# Patient Record
Sex: Female | Born: 1940
Health system: Southern US, Community
[De-identification: ages and names within clinical notes are randomized; demographics above are authoritative.]

## PROBLEM LIST (undated history)

## (undated) DIAGNOSIS — J449 Chronic obstructive pulmonary disease, unspecified: Secondary | ICD-10-CM

## (undated) DIAGNOSIS — I10 Essential (primary) hypertension: Secondary | ICD-10-CM

## (undated) DIAGNOSIS — I2510 Atherosclerotic heart disease of native coronary artery without angina pectoris: Secondary | ICD-10-CM

## (undated) DIAGNOSIS — K219 Gastro-esophageal reflux disease without esophagitis: Secondary | ICD-10-CM

## (undated) DIAGNOSIS — I4891 Unspecified atrial fibrillation: Secondary | ICD-10-CM

## (undated) DIAGNOSIS — Z9289 Personal history of other medical treatment: Secondary | ICD-10-CM

## (undated) DIAGNOSIS — E785 Hyperlipidemia, unspecified: Secondary | ICD-10-CM

## (undated) DIAGNOSIS — R011 Cardiac murmur, unspecified: Secondary | ICD-10-CM

## (undated) DIAGNOSIS — I5032 Chronic diastolic (congestive) heart failure: Secondary | ICD-10-CM

## (undated) DIAGNOSIS — I701 Atherosclerosis of renal artery: Secondary | ICD-10-CM

## (undated) DIAGNOSIS — M545 Low back pain, unspecified: Secondary | ICD-10-CM

## (undated) DIAGNOSIS — C50919 Malignant neoplasm of unspecified site of unspecified female breast: Secondary | ICD-10-CM

## (undated) DIAGNOSIS — M419 Scoliosis, unspecified: Secondary | ICD-10-CM

## (undated) DIAGNOSIS — M1990 Unspecified osteoarthritis, unspecified site: Secondary | ICD-10-CM

## (undated) DIAGNOSIS — G8929 Other chronic pain: Secondary | ICD-10-CM

## (undated) DIAGNOSIS — I219 Acute myocardial infarction, unspecified: Secondary | ICD-10-CM

## (undated) HISTORY — DX: Atherosclerotic heart disease of native coronary artery without angina pectoris: I25.10

## (undated) HISTORY — DX: Hyperlipidemia, unspecified: E78.5

## (undated) HISTORY — DX: Scoliosis, unspecified: M41.9

## (undated) HISTORY — DX: Unspecified atrial fibrillation: I48.91

## (undated) HISTORY — DX: Unspecified osteoarthritis, unspecified site: M19.90

## (undated) HISTORY — DX: Chronic diastolic (congestive) heart failure: I50.32

## (undated) HISTORY — DX: Essential (primary) hypertension: I10

## (undated) HISTORY — DX: Atherosclerosis of renal artery: I70.1

## (undated) HISTORY — DX: Chronic obstructive pulmonary disease, unspecified: J44.9

## (undated) HISTORY — PX: TUBAL LIGATION: SHX77

---

## 1969-06-27 HISTORY — PX: ABDOMINAL HYSTERECTOMY: SHX81

## 1969-06-27 HISTORY — PX: APPENDECTOMY: SHX54

## 1971-10-28 HISTORY — PX: TONSILLECTOMY: SUR1361

## 1995-10-28 DIAGNOSIS — C50919 Malignant neoplasm of unspecified site of unspecified female breast: Secondary | ICD-10-CM

## 1995-10-28 HISTORY — PX: BREAST LUMPECTOMY: SHX2

## 1995-10-28 HISTORY — PX: BREAST BIOPSY: SHX20

## 1995-10-28 HISTORY — DX: Malignant neoplasm of unspecified site of unspecified female breast: C50.919

## 1999-10-28 HISTORY — PX: LAPAROSCOPIC CHOLECYSTECTOMY: SUR755

## 2000-03-26 ENCOUNTER — Encounter: Admission: RE | Admit: 2000-03-26 | Discharge: 2000-03-26 | Payer: Self-pay | Admitting: Family Medicine

## 2000-03-26 ENCOUNTER — Encounter: Payer: Self-pay | Admitting: Family Medicine

## 2000-07-15 ENCOUNTER — Encounter: Payer: Self-pay | Admitting: Family Medicine

## 2000-07-15 ENCOUNTER — Encounter: Admission: RE | Admit: 2000-07-15 | Discharge: 2000-07-15 | Payer: Self-pay | Admitting: Family Medicine

## 2002-07-07 ENCOUNTER — Encounter: Admission: RE | Admit: 2002-07-07 | Discharge: 2002-07-07 | Payer: Self-pay | Admitting: Family Medicine

## 2002-07-07 ENCOUNTER — Encounter: Payer: Self-pay | Admitting: Family Medicine

## 2003-03-20 ENCOUNTER — Encounter: Payer: Self-pay | Admitting: Emergency Medicine

## 2003-03-20 ENCOUNTER — Emergency Department (HOSPITAL_COMMUNITY): Admission: EM | Admit: 2003-03-20 | Discharge: 2003-03-20 | Payer: Self-pay | Admitting: Emergency Medicine

## 2003-03-24 ENCOUNTER — Ambulatory Visit (HOSPITAL_COMMUNITY): Admission: RE | Admit: 2003-03-24 | Discharge: 2003-03-25 | Payer: Self-pay | Admitting: General Surgery

## 2003-03-24 ENCOUNTER — Encounter: Payer: Self-pay | Admitting: General Surgery

## 2003-03-24 ENCOUNTER — Encounter (INDEPENDENT_AMBULATORY_CARE_PROVIDER_SITE_OTHER): Payer: Self-pay | Admitting: Specialist

## 2006-03-08 ENCOUNTER — Encounter: Admission: RE | Admit: 2006-03-08 | Discharge: 2006-03-08 | Payer: Self-pay | Admitting: Orthopedic Surgery

## 2006-03-27 ENCOUNTER — Encounter: Admission: RE | Admit: 2006-03-27 | Discharge: 2006-03-27 | Payer: Self-pay | Admitting: Orthopedic Surgery

## 2006-03-31 ENCOUNTER — Encounter: Admission: RE | Admit: 2006-03-31 | Discharge: 2006-03-31 | Payer: Self-pay | Admitting: Internal Medicine

## 2006-04-10 ENCOUNTER — Encounter: Admission: RE | Admit: 2006-04-10 | Discharge: 2006-04-10 | Payer: Self-pay | Admitting: Orthopedic Surgery

## 2008-06-20 ENCOUNTER — Encounter: Payer: Self-pay | Admitting: Emergency Medicine

## 2008-08-22 ENCOUNTER — Ambulatory Visit: Payer: Self-pay | Admitting: Emergency Medicine

## 2008-08-22 DIAGNOSIS — J984 Other disorders of lung: Secondary | ICD-10-CM | POA: Insufficient documentation

## 2008-08-22 DIAGNOSIS — I10 Essential (primary) hypertension: Secondary | ICD-10-CM | POA: Insufficient documentation

## 2008-08-22 DIAGNOSIS — J309 Allergic rhinitis, unspecified: Secondary | ICD-10-CM | POA: Insufficient documentation

## 2008-08-22 DIAGNOSIS — F17200 Nicotine dependence, unspecified, uncomplicated: Secondary | ICD-10-CM | POA: Insufficient documentation

## 2008-08-22 DIAGNOSIS — J438 Other emphysema: Secondary | ICD-10-CM | POA: Insufficient documentation

## 2008-08-22 DIAGNOSIS — J449 Chronic obstructive pulmonary disease, unspecified: Secondary | ICD-10-CM | POA: Insufficient documentation

## 2008-08-22 DIAGNOSIS — C50919 Malignant neoplasm of unspecified site of unspecified female breast: Secondary | ICD-10-CM | POA: Insufficient documentation

## 2009-02-28 ENCOUNTER — Ambulatory Visit: Payer: Self-pay | Admitting: Emergency Medicine

## 2009-03-06 ENCOUNTER — Ambulatory Visit: Payer: Self-pay | Admitting: Cardiology

## 2009-03-29 ENCOUNTER — Encounter: Payer: Self-pay | Admitting: Emergency Medicine

## 2009-06-21 ENCOUNTER — Telehealth (INDEPENDENT_AMBULATORY_CARE_PROVIDER_SITE_OTHER): Payer: Self-pay | Admitting: *Deleted

## 2009-06-26 ENCOUNTER — Encounter (INDEPENDENT_AMBULATORY_CARE_PROVIDER_SITE_OTHER): Payer: Self-pay | Admitting: *Deleted

## 2009-08-16 ENCOUNTER — Encounter (INDEPENDENT_AMBULATORY_CARE_PROVIDER_SITE_OTHER): Payer: Self-pay | Admitting: *Deleted

## 2009-10-27 DIAGNOSIS — I219 Acute myocardial infarction, unspecified: Secondary | ICD-10-CM

## 2009-10-27 HISTORY — DX: Acute myocardial infarction, unspecified: I21.9

## 2009-11-27 DIAGNOSIS — Z9289 Personal history of other medical treatment: Secondary | ICD-10-CM

## 2009-11-27 HISTORY — DX: Personal history of other medical treatment: Z92.89

## 2009-11-27 HISTORY — PX: CARDIAC CATHETERIZATION: SHX172

## 2009-12-04 ENCOUNTER — Encounter: Admission: RE | Admit: 2009-12-04 | Discharge: 2009-12-04 | Payer: Self-pay | Admitting: Internal Medicine

## 2009-12-18 ENCOUNTER — Inpatient Hospital Stay (HOSPITAL_BASED_OUTPATIENT_CLINIC_OR_DEPARTMENT_OTHER): Admission: RE | Admit: 2009-12-18 | Discharge: 2009-12-18 | Payer: Self-pay | Admitting: Cardiology

## 2009-12-18 ENCOUNTER — Encounter: Payer: Self-pay | Admitting: Emergency Medicine

## 2009-12-18 ENCOUNTER — Ambulatory Visit: Payer: Self-pay | Admitting: Cardiothoracic Surgery

## 2009-12-18 ENCOUNTER — Ambulatory Visit: Payer: Self-pay | Admitting: Internal Medicine

## 2009-12-18 ENCOUNTER — Inpatient Hospital Stay (HOSPITAL_COMMUNITY): Admission: AD | Admit: 2009-12-18 | Discharge: 2009-12-27 | Payer: Self-pay | Admitting: Cardiology

## 2009-12-19 ENCOUNTER — Encounter: Payer: Self-pay | Admitting: Cardiology

## 2009-12-21 HISTORY — PX: CORONARY ARTERY BYPASS GRAFT: SHX141

## 2010-01-16 ENCOUNTER — Ambulatory Visit: Payer: Self-pay | Admitting: Cardiothoracic Surgery

## 2010-01-16 ENCOUNTER — Encounter: Admission: RE | Admit: 2010-01-16 | Discharge: 2010-01-16 | Payer: Self-pay | Admitting: Cardiothoracic Surgery

## 2010-01-21 ENCOUNTER — Inpatient Hospital Stay (HOSPITAL_COMMUNITY): Admission: EM | Admit: 2010-01-21 | Discharge: 2010-01-24 | Payer: Self-pay | Admitting: Emergency Medicine

## 2010-01-22 ENCOUNTER — Encounter: Payer: Self-pay | Admitting: Cardiology

## 2010-01-24 ENCOUNTER — Telehealth (INDEPENDENT_AMBULATORY_CARE_PROVIDER_SITE_OTHER): Payer: Self-pay | Admitting: *Deleted

## 2010-02-27 ENCOUNTER — Ambulatory Visit: Payer: Self-pay | Admitting: Cardiothoracic Surgery

## 2010-02-27 ENCOUNTER — Encounter: Admission: RE | Admit: 2010-02-27 | Discharge: 2010-02-27 | Payer: Self-pay | Admitting: Cardiothoracic Surgery

## 2010-05-14 IMAGING — CT CT ABD-PELV W/O CM
2 of 4 series · 17 of 46 positions shown, 19 images · non-contrast
Comparison: Chest CT 12/19/2009.  Lumbar MRI 03/08/2006.

CLINICAL DATA: 68-year-old female with nausea, vomiting and fever.
History of left breast cancer.  Recent CABG.  History of lung
nodules.

CT ABDOMEN AND PELVIS WITHOUT CONTRAST
TECHNIQUE: Multidetector CT imaging of the abdomen and pelvis was
performed following the standard protocol without intravenous
contrast.

[Series 2: abd/pelv w/o 5.0 b31f st · axial · non-contrast · 0.64mm/px · z∈[-436,-31]mm · 14 of 89 slices shown, 16 images]
[im 4/89  soft-tissue]
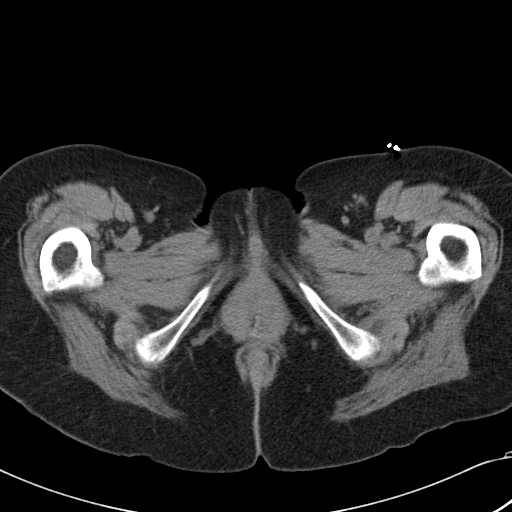
[im 4/89  bone]
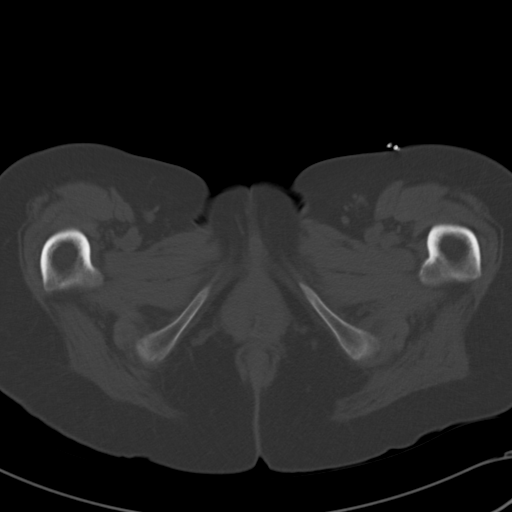
[im 12/89  soft-tissue]
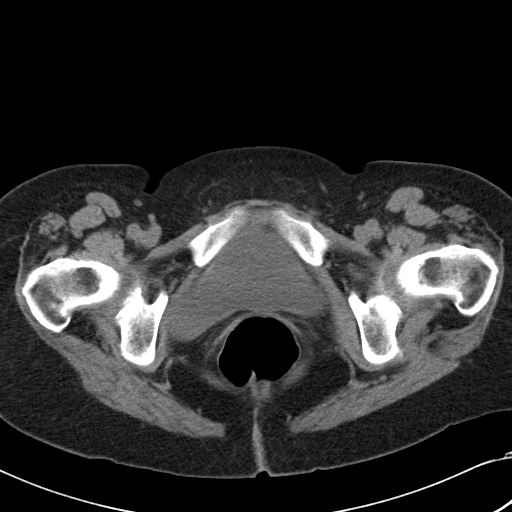
[im 19/89  soft-tissue]
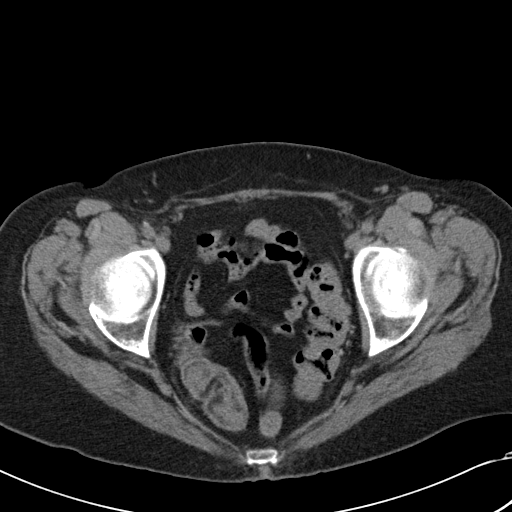
[im 23/89  soft-tissue]
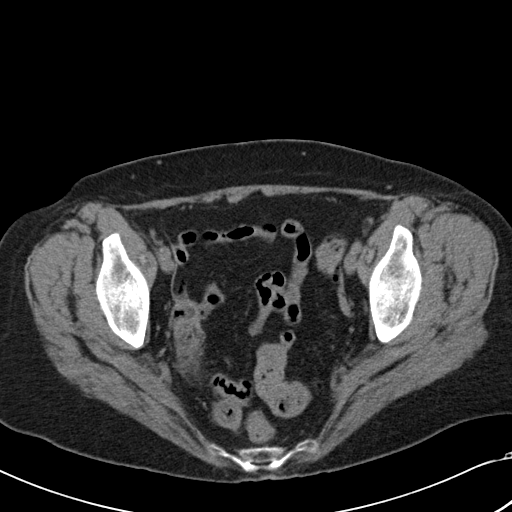
[im 30/89  soft-tissue]
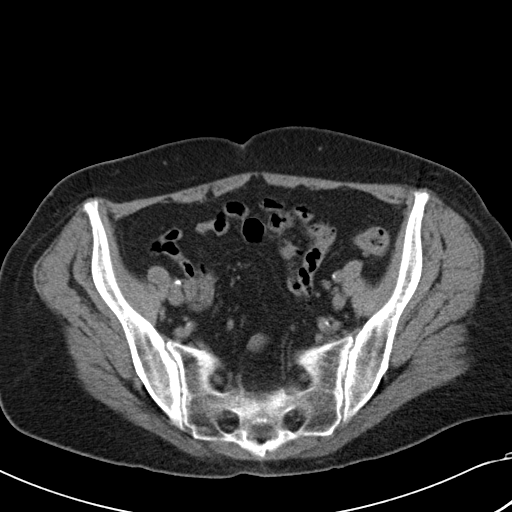
[im 37/89  soft-tissue]
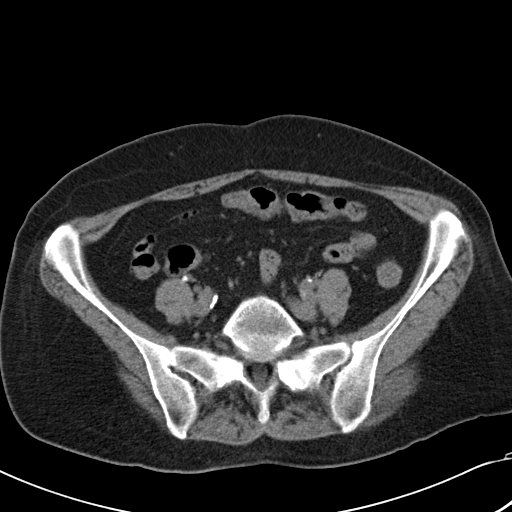
[im 41/89  soft-tissue]
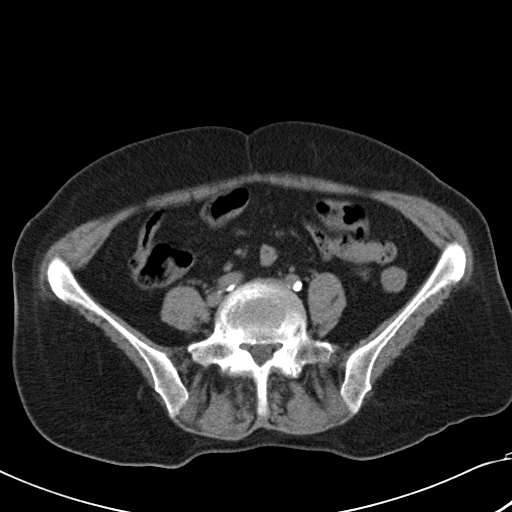
[im 48/89  soft-tissue]
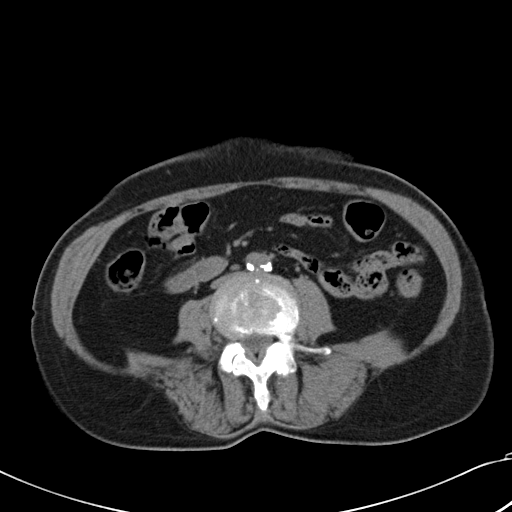
[im 52/89  soft-tissue]
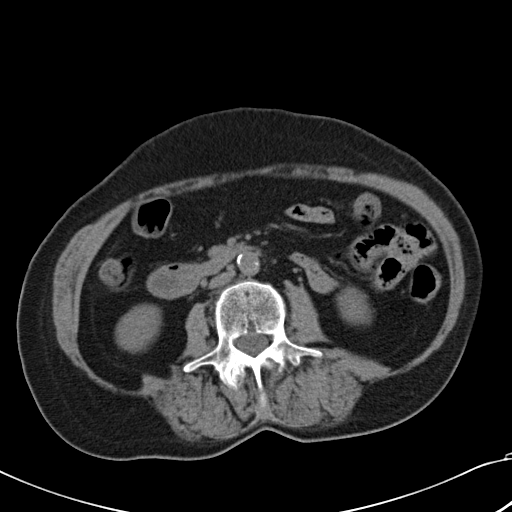
[im 52/89  bone]
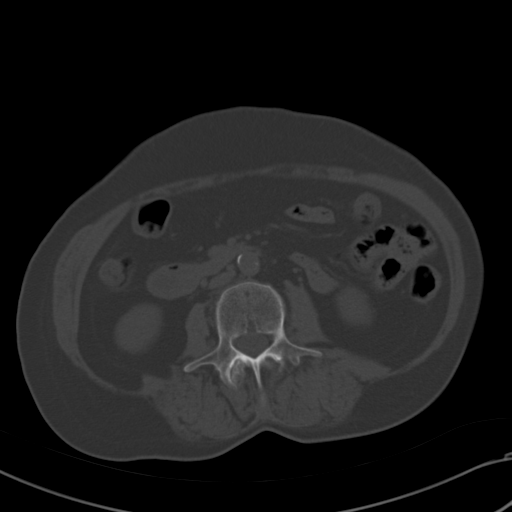
[im 59/89  soft-tissue]
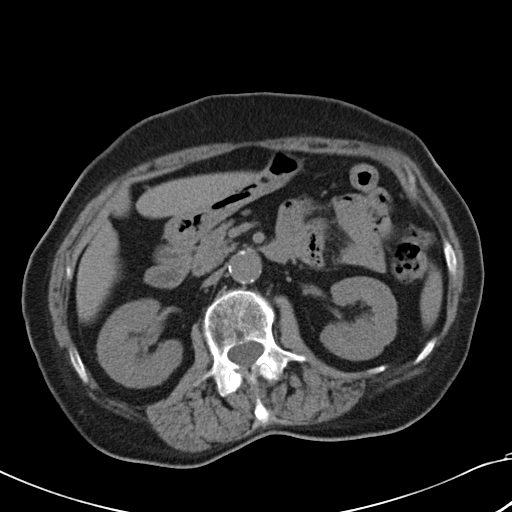
[im 67/89  soft-tissue]
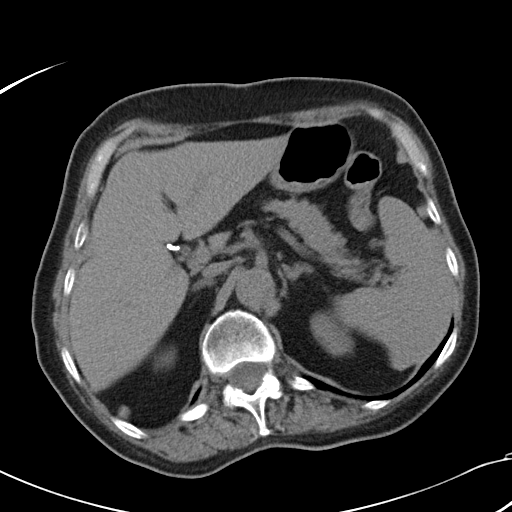
[im 70/89  soft-tissue]
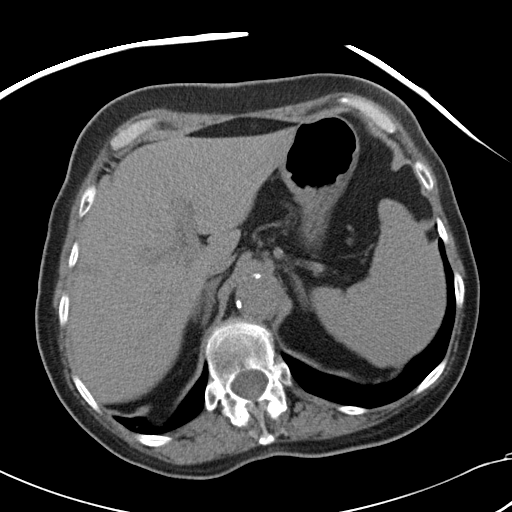
[im 78/89  soft-tissue]
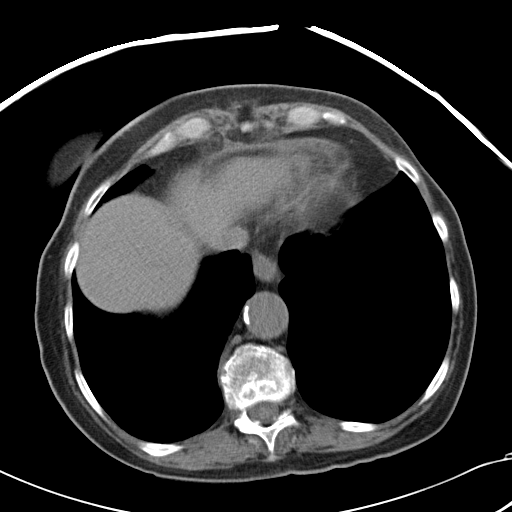
[im 85/89  soft-tissue]
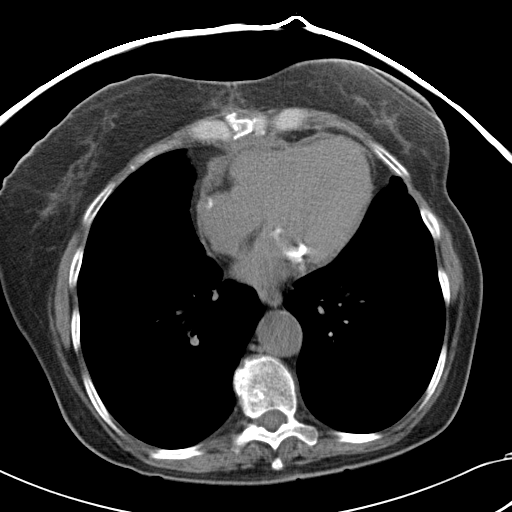

[Series 602: cor a/p · coronal · 0.87mm/px · 3 of 110 slices shown]
[im 37/110  soft-tissue]
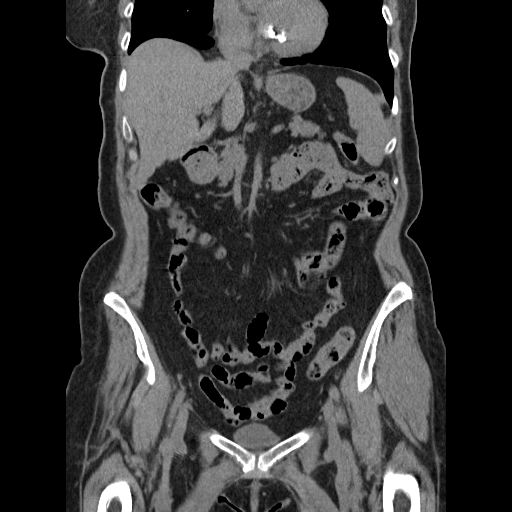
[im 49/110  soft-tissue]
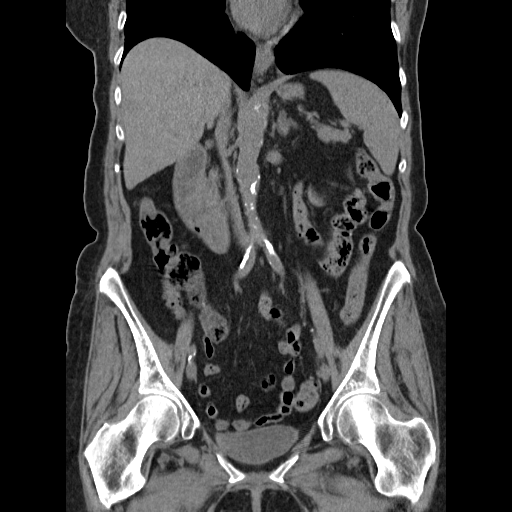
[im 61/110  soft-tissue]
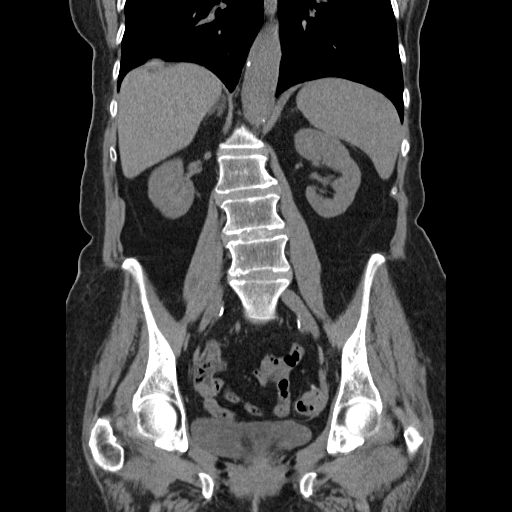

[17 of 46 positions shown; findings below may reference images not displayed]

FINDINGS: Stable 3 mm nodule in the lateral segment of the right
middle lobe.  A degree of diffuse centrilobular emphysema is re-
identified.  No pleural or pericardial effusion. No acute osseous
abnormality identified.  Diverticulosis of the sigmoid colon
without definite acute inflammatory change.  No dilated bowel.  The
cecum is located in the deep right hemi pelvis.  Bladder is
unremarkable.  Uterus and adnexa appear surgically absent.  Diffuse
calcified atherosclerosis.  Gallbladder surgically absent.
Noncontrast appearance of the liver, spleen, pancreas, adrenal
glands, and kidneys is within normal limits.  No free fluid.  The
stomach is decompressed.  Mild ectasia of the infrarenal abdominal
aorta, 223 mm in diameter.
IMPRESSION: 1.  No inflammatory process identified.
2.  Sigmoid diverticulosis.
3.  Ectasia of the infrarenal abdominal aorta.
4.  Stable small right middle lobe lung nodule.

## 2010-05-29 ENCOUNTER — Ambulatory Visit: Payer: Self-pay | Admitting: Cardiology

## 2010-06-04 ENCOUNTER — Ambulatory Visit: Payer: Self-pay | Admitting: Cardiology

## 2010-06-21 ENCOUNTER — Encounter: Payer: Self-pay | Admitting: Emergency Medicine

## 2010-06-21 ENCOUNTER — Ambulatory Visit: Payer: Self-pay | Admitting: Cardiology

## 2010-06-26 ENCOUNTER — Encounter: Admission: RE | Admit: 2010-06-26 | Discharge: 2010-06-26 | Payer: Self-pay | Admitting: Cardiology

## 2010-06-27 ENCOUNTER — Encounter: Admission: RE | Admit: 2010-06-27 | Discharge: 2010-06-27 | Payer: Self-pay | Admitting: Cardiology

## 2010-09-17 ENCOUNTER — Ambulatory Visit: Payer: Self-pay | Admitting: Cardiology

## 2010-09-17 ENCOUNTER — Ambulatory Visit: Payer: Self-pay | Admitting: Adult Health

## 2010-09-17 ENCOUNTER — Encounter: Payer: Self-pay | Admitting: Emergency Medicine

## 2010-09-18 ENCOUNTER — Ambulatory Visit (HOSPITAL_COMMUNITY)
Admission: RE | Admit: 2010-09-18 | Discharge: 2010-09-18 | Payer: Self-pay | Source: Home / Self Care | Admitting: Emergency Medicine

## 2010-09-18 ENCOUNTER — Ambulatory Visit: Payer: Self-pay

## 2010-09-18 ENCOUNTER — Encounter: Payer: Self-pay | Admitting: Cardiology

## 2010-09-18 ENCOUNTER — Encounter: Payer: Self-pay | Admitting: Emergency Medicine

## 2010-09-18 ENCOUNTER — Ambulatory Visit: Payer: Self-pay | Admitting: Cardiovascular Disease

## 2010-09-20 ENCOUNTER — Ambulatory Visit: Payer: Self-pay | Admitting: Cardiovascular Disease

## 2010-09-20 ENCOUNTER — Encounter: Payer: Self-pay | Admitting: Emergency Medicine

## 2010-10-01 ENCOUNTER — Encounter: Payer: Self-pay | Admitting: Emergency Medicine

## 2010-10-01 ENCOUNTER — Ambulatory Visit: Payer: Self-pay | Admitting: Vascular Surgery

## 2010-10-09 ENCOUNTER — Ambulatory Visit: Payer: Self-pay | Admitting: Emergency Medicine

## 2010-10-09 DIAGNOSIS — I509 Heart failure, unspecified: Secondary | ICD-10-CM

## 2010-10-09 DIAGNOSIS — D649 Anemia, unspecified: Secondary | ICD-10-CM | POA: Insufficient documentation

## 2010-10-09 DIAGNOSIS — I2510 Atherosclerotic heart disease of native coronary artery without angina pectoris: Secondary | ICD-10-CM | POA: Insufficient documentation

## 2010-10-09 DIAGNOSIS — I110 Hypertensive heart disease with heart failure: Secondary | ICD-10-CM | POA: Insufficient documentation

## 2010-10-09 LAB — CONVERTED CEMR LAB: Cholesterol, target level: 200 mg/dL

## 2010-11-17 ENCOUNTER — Encounter: Payer: Self-pay | Admitting: Cardiothoracic Surgery

## 2010-11-17 ENCOUNTER — Encounter: Payer: Self-pay | Admitting: Orthopedic Surgery

## 2010-11-28 NOTE — Cardiovascular Report (Signed)
Summary: Roger Shelter MD  Roger Shelter MD   Imported By: Lester [place] 10/11/2010 12:27:55  _____________________________________________________________________  External Attachment:    Type:   Image     Comment:   External Document

## 2010-11-28 NOTE — Letter (Signed)
Summary: [facility]   Imported By: Lester [place] 10/11/2010 12:31:07  _____________________________________________________________________  External Attachment:    Type:   Image     Comment:   External Document

## 2010-11-28 NOTE — Letter (Signed)
Summary: [facility]  [facility]   Imported By: Sherian Rein 10/24/2010 10:11:43  _____________________________________________________________________  External Attachment:    Type:   Image     Comment:   External Document

## 2010-11-28 NOTE — Letter (Signed)
Summary: [facility]  [facility]   Imported By: Sherian Rein 10/11/2010 10:04:43  _____________________________________________________________________  External Attachment:    Type:   Image     Comment:   External Document

## 2010-11-28 NOTE — Progress Notes (Signed)
Summary: Proventil changed to Proair due to insurance coverage  Phone Note Outgoing Call   Call placed by: Michel Bickers CMA,  January 24, 2010 4:03 PM Call placed to: Patient Summary of Call: Patients insurance will no longer cover Proventil.  Proair is covered  and Dr. Delton Coombes is okay with the change. I have sent a new RX to the pt's pharmacy and the pt has been notified. Initial call taken by: Michel Bickers CMA,  January 24, 2010 4:07 PM    New/Updated Medications: PROAIR HFA 108 (90 BASE) MCG/ACT AERS (ALBUTEROL SULFATE) 2 puffs every four hours as needed for shortness of breath Prescriptions: PROAIR HFA 108 (90 BASE) MCG/ACT AERS (ALBUTEROL SULFATE) 2 puffs every four hours as needed for shortness of breath  #1 x 2   Entered by:   Michel Bickers CMA   Authorized by:   Leslye Peer MD   Signed by:   Michel Bickers CMA on 01/24/2010   Method used:   Electronically to        [facility]  [phone]* (retail)       [address]       [place], [place]  [address]       Ph: [phone]       Fax: [phone]   RxID:   [id number]

## 2010-11-28 NOTE — Letter (Signed)
Summary: [facility]  [facility]   Imported By: Lester [place] 10/11/2010 12:25:33  _____________________________________________________________________  External Attachment:    Type:   Image     Comment:   External Document

## 2010-11-28 NOTE — Assessment & Plan Note (Signed)
Summary: COPD, CAD, anemia   Visit Type:  Follow-up Copy to:  Dr. Eloise Harman Primary Provider/Referring Provider:  Dr Jarome Matin  CC:  Pt here for follow-up on Echo results...she c/o increased SOB with exertion and wheezing at night and Lipid Management.  History of Present Illness: 70 yo woman with heavy tobacco use, allergies, HTN, divertulitis, breast CA s/p L lumpectomy in 1998.   September 17, 2010 -Presents for an acute office visit. Complains of over last 2 weeks her breathing has  gotten worse. She gets dyspneic with walking incline, wears out easily. Says she has been very swollen, esp in legs. "feel puffed up everywhere" . She was seen by PCP 1 week ago tx w/ zpack and depo medrol shot. with no improvement. Says she had a dry cough but this has resolved. Does have orthopnea. [facility] this [facility], lasix was increased 40mg  two times a day, labs w/ bnp is pending. She has been set up for PFT and echo later this week. She is been referred for possible renal artery stenting. She was started on Lisinopril this am for elevated b/p. She quit smoking in 11/2009 after her CABG. We have not seen her in >1 year. Says she takes her symbicort and spiriva everyday. Computer review shows CT scan done in 11/2009 showed stable lung nodules over  2 yr span, so considered benign in nature. Says she had cxr done at PCP last week w/ no PNA on it. Denies chest pain,  hemoptysis, fever, n/v/d, edema, headache, discolored mucus.   ROV 10/09/10 -- 70 yo former smoker, hx CAD, underwent CABG since I last saw her. Seen by TP as above, has had echocardiogram performed that showed LVH and grade 1 diastolic dysfxn, estimated PASP . Lasix increased in November in response to increased dyspnea. Still "retaining some fluid". Her breathing has been slightly better today, but overall her breathing, fatigue, edema, have been worse since her CABG. No cough, she does have some wheeze at night and some orthopnea.  Also some wheeze with exertion. On Symbicort + Spiriva, ProAir about 2x a day. Pulm nodule was stable > 2 yrs by last CT scan. She reports that she has been noted to be anemic, has B12 deficiency, is under eval by Dr Dulce Sellar for possible CSY. They want risk eval before proceeding.   Lipid Management History:      Positive NCEP/ATP III risk factors include female age 27 years old or older, current tobacco user, hypertension, and ASHD (either angina/prior MI/prior CABG).    Current Medications (verified): 1)  Spiriva Handihaler 18 Mcg  Caps (Tiotropium Bromide Monohydrate) .... Two Puffs in Handihaler Daily 2)  Hydrocodone-Acetaminophen 5-500 Mg Tabs (Hydrocodone-Acetaminophen) .... Take 1 Tablet By Mouth Two Times A Day 3)  Verapamil Hcl 40 Mg Tabs (Verapamil Hcl) .Marland Kitchen.. 1 By Mouth Daily 4)  Symbicort 160-4.5 Mcg/act  Aero (Budesonide-Formoterol Fumarate) .... Two Puffs Twice Daily 5)  Proair Hfa 108 (90 Base) Mcg/act Aers (Albuterol Sulfate) .... 2 Puffs Every Four Hours As Needed For Shortness of Breath 6)  Fluticasone Propionate 50 Mcg/act Susp (Fluticasone Propionate) .... 2 Sprays in Each Nostril Daily 7)  Loratadine 10 Mg Tabs (Loratadine) .... Take 1 Tablet By Mouth Once A Day 8)  Lisinopril 10 Mg Tabs (Lisinopril) .Marland Kitchen.. 1 By Mouth Once Daily 9)  Metoprolol Succinate 50 Mg Xr24h-Tab (Metoprolol Succinate) .... 1/2 By Mouth Daily  Allergies (verified): No Known Drug Allergies  Vital Signs:  Patient profile:   70  year old female Height:      62 inches (157.48 cm) Weight:      175.50 pounds (79.77 kg) BMI:     32.22 O2 Sat:      97 % on Room air Temp:     97.9 degrees F (36.61 degrees C) oral Pulse rate:   93 / minute BP sitting:   124 / 72  (right arm) Cuff size:   regular  Vitals Entered By: Michel Bickers CMA (October 09, 2010 3:58 PM)  O2 Sat at Rest %:  97 O2 Flow:  Room air  Serial Vital Signs/Assessments:  Comments: 5:04 PM Ambulatory Pulse Oximetry  Resting; HR__73___     02 Sat_97% on room air____  Lap1 (185 feet)   HR_92____   02 Sat__98% on room air___ Lap2 (185 feet)   HR_101____   02 Sat__98% on room air___    Lap3 (185 feet)   HR_101____   02 Sat__96% on room air___  x___Test Completed without Difficulty ___Test Stopped due to:  By: Michel Bickers CMA   CC: Pt here for follow-up on Echo results...she c/o increased SOB with exertion and wheezing at night, Lipid Management Comments Medications reviewed with patient Michel Bickers [facility]  October 09, 2010 3:58 PM   Physical Exam  General:  normal appearance.   Head:  normocephalic and atraumatic Nose:  no deformity, discharge, inflammation, or lesions Mouth:  no deformity or lesions Neck:  no masses, thyromegaly, or abnormal cervical nodes Chest Wall:  no deformities noted Lungs:  distant, no wheeze during normal cycle or on forced expiration Heart:  regular rate and rhythm, S1, S2 without murmurs, rubs, gallops, or clicks Abdomen:  not examined Msk:  no deformity or scoliosis noted with normal posture Extremities:  no clubbing, cyanosis, edema, or deformity noted Neurologic:  non-focal Skin:  intact without lesions or rashes Psych:  alert and cooperative; normal mood and affect; normal attention span and concentration   Impression & Recommendations:  Problem # 1:  COPD (ICD-496) - walking oximetry today - Spiriva + Symbicort - review PFT  Problem # 2:  PULMONARY NODULE (ICD-518.89) Stable > 2 yrs by CT scan  Problem # 3:  BEN HYPERTENSIVE HEART DISEASE W/HEART FAILURE (ICD-402.11)  Suspect that her dyspnea is due to her HTN and diastolic dysfxn. She has stopped smoking, is on stable BD's, has had her CAD addressed w CABG. We discussed possibility that she could have OSA.  - agree w agressive BP control, diuretics  Orders: Est. Patient Level IV (16109)  Problem # 4:  ANEMIA (ICD-285.9)  Could explain her dyspnea as well. Eval has revealed B12 def but I agree that we need r/o GI  malignancy. She is following w Dr Dulce Sellar. I explained to her that she is at higher risk for any procedure, even those under conscious sedation, due to her lung and heart disease, but that she could and should undergo the CSY if the benefits outweigh these risks.   Orders: Est. Patient Level IV (60454)  Medications Added to Medication List This Visit: 1)  Verapamil Hcl 40 Mg Tabs (Verapamil hcl) .Marland Kitchen.. 1 by mouth daily 2)  Fluticasone Propionate 50 Mcg/act Susp (Fluticasone propionate) .... 2 sprays in each nostril daily 3)  Metoprolol Succinate 50 Mg Xr24h-tab (Metoprolol succinate) .... 1/2 by mouth daily  Lipid Assessment/Plan:      Based on NCEP/ATP III, the patient's risk factor category is "history of coronary disease, peripheral vascular disease, cerebrovascular disease, or aortic aneurysm along  with either diabetes, current smoker, or LDL > 130 plus HDL < 40 plus triglycerides > 200".  The patient's lipid goals are as follows: Total cholesterol goal is 200; LDL cholesterol goal is 70; HDL cholesterol goal is 40; Triglyceride goal is 150.     Patient Instructions: 1)  Continue your Spiriva and Symbicort 2)  Use ProAir as needed  3)  Walking oximetry today shows that you do not need oxygen when you exert yourself.  4)  Continue lisinopril and lasix as ordered by Dr Ronnald Nian office 5)  You are at increased risk for any procedure due to your lung and heart disease, but this does not exclude you from having colonoscopy if needed.  6)  Follow up with Dr Delton Coombes in 3 months or as needed

## 2010-11-28 NOTE — Assessment & Plan Note (Signed)
Summary: sob/jd   Copy to:  Dr. Eloise Harman Primary Gaila Engebretsen/Referring [place]:  Dr Jarome Matin  CC:  short of breath with walking .  History of Present Illness: 70 yo woman with heavy tobacco use, allergies, HTN, divertulitis, breast CA s/p L lumpectomy in 1998.   September 17, 2010 -Presents for an acute office visit. Complains of over last 2 weeks her breathing has  gotten worse. She gets dyspneic with walking incline, wears out easily. Says she has been very swollen, esp in legs. "feel puffed up everywhere" . She was seen by PCP 1 week ago tx w/ zpack and depo medrol shot. with no improvement. Says she had a dry cough but this has resolved. Does have orthopnea. [facility] was [facility], lasix was increased 40mg  two times a day, labs w/ bnp is pending. She has been set up for PFT and echo later this week. She is been referred for possible renal artery stenting. She was started on Lisinopril this am for elevated b/p. She quit smoking in 11/2009 after her CABG. We have not seen her in >1 year. Says she takes her symbicort and spiriva everyday. Computer review shows CT scan done in 11/2009 showed stable lung nodules over  2 yr span, so considered benign in nature. Says she had cxr done at PCP last week w/ no PNA on it. Denies chest pain,  hemoptysis, fever, n/v/d, edema, headache, discolored mucus.   Allergies: No Known Drug Allergies  Past History:  Past Medical History: Last updated: 08/22/2008 Allergic Rhinitis Emphysema Hypertension  Past Surgical History: Last updated: 02/28/2009 Breast Surgery:....cancer   Social History: Last updated: 09/17/2010 Patient quit 11/2009, Smoked since age 4  1ppd.  Pt is married with children. Pt is retired.  Previously worked  in [employer], dispensing Works part time for [employer].  Social History: Patient quit 11/2009, Smoked since age 89  1ppd.  Pt is married with children. Pt is retired.  Previously worked  in  Optician, dispensing Works part time for [employer].  Review of Systems      See HPI  Vital Signs:  Patient profile:   70 year old female Height:      62 inches Weight:      178 pounds O2 Sat:      99 % on Room air Temp:     97 degrees F oral Pulse rate:   84 / minute Resp:     14 per minute BP sitting:   130 / 70  O2 Flow:  Room air  Physical Exam  Additional Exam:  GEN: A/Ox3; pleasant , NAD HEENT:  [place]/AT, , EACs-clear, TMs-wnl, NOSE-clear, THROAT-clear NECK:  Supple w/ fair ROM; no JVD; normal carotid impulses w/o bruits; no thyromegaly or nodules palpated; no lymphadenopathy. RESP  Clear to P & A; w/o, wheezes/ rales/ or rhonchi. CARD:  RRR, no m/r/g , 2+ edema bilaterally  GI:   Soft & nt; nml bowel sounds; no organomegaly or masses detected. [place]: Warm bil,  no calf tenderness, clubbing, pulses intact Neuro: intact w/ no focal deficits noted.    Impression & Recommendations:  Problem # 1:  COPD (ICD-496) Hx of smoking. She is going for PFTs in am , we have no previous FEV1 on file.  CT scan w/ stable nodules x 1 year, considered benign.  recent xray per pt w/ no acute process.  suspect her dsypnea is multifactoral w/ COPD ? severity along w/ fluid overload ? cor pulmonale  she is going for 2 d echo this week may have component of diastolic dsfxn as well  would tx w/ diuresis. hold steroid at this time- she is not actively wheezing and steroids will increase  fluid retention.  no abx -she has no fever or discolored mucus and recent xray neg.  follow up Dr. Delton Coombes in 2 weeks to review PFTs results.  follow up Cards on 11/25 as planned  Plan  Stop Advil, avoid NSAIDS( advil, ibuprofen, aleve, etc) Follow up Dr. Jiles Harold, Dec. 14th at 3:50pm  Continue on Symbicort and Spiriva .  Low salt diet, keep legs elevated.  follow up for breathing test in am.  follow up for echo as scheduled this week.  Please contact office for sooner follow up if symptoms do  not improve or worsen   Medications Added to Medication List This Visit: 1)  Lisinopril 10 Mg Tabs (Lisinopril) .Marland Kitchen.. 1 by mouth once daily  Complete Medication List: 1)  Spiriva Handihaler 18 Mcg Caps (Tiotropium bromide monohydrate) .... Two puffs in handihaler daily 2)  Hydrocodone-acetaminophen 5-500 Mg Tabs (Hydrocodone-acetaminophen) .... Take 1 tablet by mouth two times a day 3)  Benicar 20 Mg Tabs (Olmesartan medoxomil) .... Take 1 tablet by mouth once a day 4)  Symbicort 160-4.5 Mcg/act Aero (Budesonide-formoterol fumarate) .... Two puffs twice daily 5)  Proair Hfa 108 (90 Base) Mcg/act Aers (Albuterol sulfate) .... 2 puffs every four hours as needed for shortness of breath 6)  Nasacort Aq 55 Mcg/act Aers (Triamcinolone acetonide(nasal)) .... Take 2 sprays in each nostril once a day 7)  Loratadine 10 Mg Tabs (Loratadine) .... Take 1 tablet by mouth once a day 8)  Lisinopril 10 Mg Tabs (Lisinopril) .Marland Kitchen.. 1 by mouth once daily  Other Orders: Est. Patient Level III (78295)  Patient Instructions: 1)  Stop Advil, avoid NSAIDS( advil, ibuprofen, aleve, etc) 2)  Follow up Dr. Jiles Harold, Dec. 14th at 3:50pm  3)  Continue on Symbicort and Spiriva .  4)  Low salt diet, keep legs elevated.  5)  follow up for breathing test in am.  6)  follow up for echo as scheduled this week.  7)  Please contact office for sooner follow up if symptoms do not improve or worsen   Appended Document: sob/jd faxed via biscom to Dr. Deborah Chalk per TP's request.  copy of ov note printed and faxed to Norma Fredrickson ANP with [facility] @ [phone].

## 2010-11-28 NOTE — Letter (Signed)
Summary: [facility]   Imported By: Lester [place] 07/19/2010 07:57:52  _____________________________________________________________________  External Attachment:    Type:   Image     Comment:   External Document

## 2011-01-15 LAB — CROSSMATCH
ABO/RH(D): A POS
Antibody Screen: NEGATIVE

## 2011-01-15 LAB — HEPATIC FUNCTION PANEL
ALT: 15 U/L (ref 0–35)
AST: 18 U/L (ref 0–37)
Alkaline Phosphatase: 103 U/L (ref 39–117)
Bilirubin, Direct: <0.1 mg/dL (ref 0.0–0.3)
Total Bilirubin: 0.6 mg/dL (ref 0.3–1.2)

## 2011-01-15 LAB — CBC
HCT: 28.2 % — ABNORMAL LOW (ref 36.0–46.0)
HCT: 29.8 % — ABNORMAL LOW (ref 36.0–46.0)
HCT: 31.1 % — ABNORMAL LOW (ref 36.0–46.0)
HCT: 31.8 % — ABNORMAL LOW (ref 36.0–46.0)
HCT: 32.9 % — ABNORMAL LOW (ref 36.0–46.0)
HCT: 34.5 % — ABNORMAL LOW (ref 36.0–46.0)
HCT: 35.7 % — ABNORMAL LOW (ref 36.0–46.0)
Hemoglobin: 10 g/dL — ABNORMAL LOW (ref 12.0–15.0)
Hemoglobin: 10.4 g/dL — ABNORMAL LOW (ref 12.0–15.0)
Hemoglobin: 10.6 g/dL — ABNORMAL LOW (ref 12.0–15.0)
Hemoglobin: 10.7 g/dL — ABNORMAL LOW (ref 12.0–15.0)
Hemoglobin: 11.1 g/dL — ABNORMAL LOW (ref 12.0–15.0)
MCHC: 33.1 g/dL (ref 30.0–36.0)
MCHC: 33.3 g/dL (ref 30.0–36.0)
MCHC: 33.5 g/dL (ref 30.0–36.0)
MCHC: 33.7 g/dL (ref 30.0–36.0)
MCHC: 33.7 g/dL (ref 30.0–36.0)
MCV: 76.9 fL — ABNORMAL LOW (ref 78.0–100.0)
MCV: 78.5 fL (ref 78.0–100.0)
MCV: 79.2 fL (ref 78.0–100.0)
MCV: 79.6 fL (ref 78.0–100.0)
MCV: 79.8 fL (ref 78.0–100.0)
MCV: 79.9 fL (ref 78.0–100.0)
Platelets: 139 10*3/uL — ABNORMAL LOW (ref 150–400)
Platelets: 139 10*3/uL — ABNORMAL LOW (ref 150–400)
Platelets: 148 10*3/uL — ABNORMAL LOW (ref 150–400)
Platelets: 154 10*3/uL (ref 150–400)
Platelets: 196 10*3/uL (ref 150–400)
RBC: 3.53 MIL/uL — ABNORMAL LOW (ref 3.87–5.11)
RBC: 3.73 MIL/uL — ABNORMAL LOW (ref 3.87–5.11)
RBC: 3.93 MIL/uL (ref 3.87–5.11)
RBC: 3.99 MIL/uL (ref 3.87–5.11)
RBC: 4.19 MIL/uL (ref 3.87–5.11)
RBC: 4.65 MIL/uL (ref 3.87–5.11)
RDW: 17 % — ABNORMAL HIGH (ref 11.5–15.5)
RDW: 17.4 % — ABNORMAL HIGH (ref 11.5–15.5)
RDW: 17.5 % — ABNORMAL HIGH (ref 11.5–15.5)
RDW: 17.6 % — ABNORMAL HIGH (ref 11.5–15.5)
RDW: 17.9 % — ABNORMAL HIGH (ref 11.5–15.5)
RDW: 18.4 % — ABNORMAL HIGH (ref 11.5–15.5)
WBC: 10.6 10*3/uL — ABNORMAL HIGH (ref 4.0–10.5)
WBC: 12.1 10*3/uL — ABNORMAL HIGH (ref 4.0–10.5)
WBC: 12.9 10*3/uL — ABNORMAL HIGH (ref 4.0–10.5)
WBC: 18.2 10*3/uL — ABNORMAL HIGH (ref 4.0–10.5)
WBC: 4.7 10*3/uL (ref 4.0–10.5)
WBC: 5.4 10*3/uL (ref 4.0–10.5)
WBC: 9.3 10*3/uL (ref 4.0–10.5)

## 2011-01-15 LAB — BASIC METABOLIC PANEL
BUN: 13 mg/dL (ref 6–23)
BUN: 14 mg/dL (ref 6–23)
BUN: 19 mg/dL (ref 6–23)
BUN: 24 mg/dL — ABNORMAL HIGH (ref 6–23)
CO2: 24 mEq/L (ref 19–32)
CO2: 24 mEq/L (ref 19–32)
Calcium: 8.1 mg/dL — ABNORMAL LOW (ref 8.4–10.5)
Calcium: 8.3 mg/dL — ABNORMAL LOW (ref 8.4–10.5)
Calcium: 8.4 mg/dL (ref 8.4–10.5)
Calcium: 9.1 mg/dL (ref 8.4–10.5)
Chloride: 101 mEq/L (ref 96–112)
Chloride: 103 mEq/L (ref 96–112)
Chloride: 104 mEq/L (ref 96–112)
Chloride: 108 mEq/L (ref 96–112)
Creatinine, Ser: 0.79 mg/dL (ref 0.4–1.2)
Creatinine, Ser: 0.88 mg/dL (ref 0.4–1.2)
Creatinine, Ser: 1.3 mg/dL — ABNORMAL HIGH (ref 0.4–1.2)
GFR calc Af Amer: 49 mL/min — ABNORMAL LOW (ref >60)
GFR calc Af Amer: 55 mL/min — ABNORMAL LOW (ref >60)
GFR calc Af Amer: >60 mL/min (ref >60)
GFR calc Af Amer: >60 mL/min (ref >60)
GFR calc Af Amer: >60 mL/min (ref >60)
GFR calc non Af Amer: 41 mL/min — ABNORMAL LOW (ref >60)
GFR calc non Af Amer: >60 mL/min (ref >60)
GFR calc non Af Amer: >60 mL/min (ref >60)
GFR calc non Af Amer: >60 mL/min (ref >60)
Glucose, Bld: 100 mg/dL — ABNORMAL HIGH (ref 70–99)
Glucose, Bld: 120 mg/dL — ABNORMAL HIGH (ref 70–99)
Glucose, Bld: 137 mg/dL — ABNORMAL HIGH (ref 70–99)
Glucose, Bld: 149 mg/dL — ABNORMAL HIGH (ref 70–99)
Potassium: 3.9 mEq/L (ref 3.5–5.1)
Potassium: 4 mEq/L (ref 3.5–5.1)
Potassium: 4.4 mEq/L (ref 3.5–5.1)
Potassium: 4.7 mEq/L (ref 3.5–5.1)
Sodium: 133 mEq/L — ABNORMAL LOW (ref 135–145)
Sodium: 135 mEq/L (ref 135–145)
Sodium: 137 mEq/L (ref 135–145)
Sodium: 137 mEq/L (ref 135–145)

## 2011-01-15 LAB — POCT I-STAT 3, ART BLOOD GAS (G3+)
Acid-base deficit: 1 mmol/L (ref 0.0–2.0)
Acid-base deficit: 3 mmol/L — ABNORMAL HIGH (ref 0.0–2.0)
Bicarbonate: 22.2 mEq/L (ref 20.0–24.0)
Bicarbonate: 22.9 mEq/L (ref 20.0–24.0)
Bicarbonate: 23.7 mEq/L (ref 20.0–24.0)
O2 Saturation: 100 %
O2 Saturation: 100 %
O2 Saturation: 96 %
Patient temperature: 36.1
Patient temperature: 37.1
TCO2: 23 mmol/L (ref 0–100)
TCO2: 26 mmol/L (ref 0–100)
pCO2 arterial: 38.4 mmHg (ref 35.0–45.0)
pCO2 arterial: 49.2 mmHg — ABNORMAL HIGH (ref 35.0–45.0)
pH, Arterial: 7.38 (ref 7.350–7.400)
pH, Arterial: 7.4 (ref 7.350–7.400)
pO2, Arterial: 318 mmHg — ABNORMAL HIGH (ref 80.0–100.0)
pO2, Arterial: 72 mmHg — ABNORMAL LOW (ref 80.0–100.0)
pO2, Arterial: 90 mmHg (ref 80.0–100.0)

## 2011-01-15 LAB — COMPREHENSIVE METABOLIC PANEL
ALT: 16 U/L (ref 0–35)
AST: 17 U/L (ref 0–37)
CO2: 26 mEq/L (ref 19–32)
Calcium: 9.3 mg/dL (ref 8.4–10.5)
Chloride: 102 mEq/L (ref 96–112)
Creatinine, Ser: 1.08 mg/dL (ref 0.4–1.2)
GFR calc Af Amer: >60 mL/min (ref >60)
GFR calc non Af Amer: 51 mL/min — ABNORMAL LOW (ref >60)
Glucose, Bld: 106 mg/dL — ABNORMAL HIGH (ref 70–99)
Sodium: 137 mEq/L (ref 135–145)
Total Bilirubin: 0.6 mg/dL (ref 0.3–1.2)

## 2011-01-15 LAB — GLUCOSE, CAPILLARY
Glucose-Capillary: 101 mg/dL — ABNORMAL HIGH (ref 70–99)
Glucose-Capillary: 112 mg/dL — ABNORMAL HIGH (ref 70–99)
Glucose-Capillary: 130 mg/dL — ABNORMAL HIGH (ref 70–99)
Glucose-Capillary: 143 mg/dL — ABNORMAL HIGH (ref 70–99)
Glucose-Capillary: 145 mg/dL — ABNORMAL HIGH (ref 70–99)
Glucose-Capillary: 146 mg/dL — ABNORMAL HIGH (ref 70–99)
Glucose-Capillary: 158 mg/dL — ABNORMAL HIGH (ref 70–99)
Glucose-Capillary: 168 mg/dL — ABNORMAL HIGH (ref 70–99)
Glucose-Capillary: 87 mg/dL (ref 70–99)
Glucose-Capillary: 96 mg/dL (ref 70–99)

## 2011-01-15 LAB — URINALYSIS, ROUTINE W REFLEX MICROSCOPIC
Bilirubin Urine: NEGATIVE
Glucose, UA: NEGATIVE mg/dL
Hgb urine dipstick: NEGATIVE
Ketones, ur: NEGATIVE mg/dL
Nitrite: NEGATIVE
Protein, ur: NEGATIVE mg/dL
Specific Gravity, Urine: 1.019 (ref 1.005–1.030)
Urobilinogen, UA: 0.2 mg/dL (ref 0.0–1.0)
pH: 5.5 (ref 5.0–8.0)

## 2011-01-15 LAB — POCT I-STAT 4, (NA,K, GLUC, HGB,HCT)
Glucose, Bld: 103 mg/dL — ABNORMAL HIGH (ref 70–99)
Glucose, Bld: 113 mg/dL — ABNORMAL HIGH (ref 70–99)
HCT: 23 % — ABNORMAL LOW (ref 36.0–46.0)
HCT: 24 % — ABNORMAL LOW (ref 36.0–46.0)
HCT: 29 % — ABNORMAL LOW (ref 36.0–46.0)
Hemoglobin: 11.2 g/dL — ABNORMAL LOW (ref 12.0–15.0)
Hemoglobin: 7.8 g/dL — ABNORMAL LOW (ref 12.0–15.0)
Hemoglobin: 8.2 g/dL — ABNORMAL LOW (ref 12.0–15.0)
Potassium: 3.7 mEq/L (ref 3.5–5.1)
Potassium: 3.9 mEq/L (ref 3.5–5.1)
Potassium: 5 mEq/L (ref 3.5–5.1)
Sodium: 135 mEq/L (ref 135–145)
Sodium: 136 mEq/L (ref 135–145)
Sodium: 137 mEq/L (ref 135–145)

## 2011-01-15 LAB — CREATININE, SERUM
Creatinine, Ser: 0.74 mg/dL (ref 0.4–1.2)
GFR calc Af Amer: >60 mL/min (ref >60)
GFR calc non Af Amer: >60 mL/min (ref >60)

## 2011-01-15 LAB — MAGNESIUM
Magnesium: 2.2 mg/dL (ref 1.5–2.5)
Magnesium: 2.2 mg/dL (ref 1.5–2.5)
Magnesium: 2.6 mg/dL — ABNORMAL HIGH (ref 1.5–2.5)

## 2011-01-15 LAB — POCT I-STAT, CHEM 8
BUN: 15 mg/dL (ref 6–23)
Calcium, Ion: 1.14 mmol/L (ref 1.12–1.32)
Chloride: 110 mEq/L (ref 96–112)
Creatinine, Ser: 0.8 mg/dL (ref 0.4–1.2)
Glucose, Bld: 142 mg/dL — ABNORMAL HIGH (ref 70–99)
HCT: 29 % — ABNORMAL LOW (ref 36.0–46.0)

## 2011-01-15 LAB — PREPARE PLATELETS

## 2011-01-15 LAB — BLOOD GAS, ARTERIAL
Acid-base deficit: 0.6 mmol/L (ref 0.0–2.0)
Bicarbonate: 23.3 mEq/L (ref 20.0–24.0)
Drawn by: 290241
FIO2: 0.21 %
O2 Saturation: 95.6 %
Patient temperature: 98.2
TCO2: 24.4 mmol/L (ref 0–100)
pCO2 arterial: 36.5 mmHg (ref 35.0–45.0)
pH, Arterial: 7.42 — ABNORMAL HIGH (ref 7.350–7.400)
pO2, Arterial: 75.2 mmHg — ABNORMAL LOW (ref 80.0–100.0)

## 2011-01-15 LAB — URINE MICROSCOPIC-ADD ON

## 2011-01-15 LAB — MRSA PCR SCREENING: MRSA by PCR: NEGATIVE

## 2011-01-15 LAB — PROTIME-INR
INR: 1.33 (ref 0.00–1.49)
Prothrombin Time: 12.7 seconds (ref 11.6–15.2)
Prothrombin Time: 16.4 seconds — ABNORMAL HIGH (ref 11.6–15.2)

## 2011-01-15 LAB — APTT: aPTT: 32 seconds (ref 24–37)

## 2011-01-15 LAB — POCT I-STAT GLUCOSE: Glucose, Bld: 103 mg/dL — ABNORMAL HIGH (ref 70–99)

## 2011-01-19 LAB — BASIC METABOLIC PANEL
BUN: 22 mg/dL (ref 6–23)
BUN: 26 mg/dL — ABNORMAL HIGH (ref 6–23)
BUN: 28 mg/dL — ABNORMAL HIGH (ref 6–23)
BUN: 34 mg/dL — ABNORMAL HIGH (ref 6–23)
CO2: 28 mEq/L (ref 19–32)
CO2: 31 mEq/L (ref 19–32)
CO2: 33 mEq/L — ABNORMAL HIGH (ref 19–32)
CO2: 33 mEq/L — ABNORMAL HIGH (ref 19–32)
Calcium: 8.2 mg/dL — ABNORMAL LOW (ref 8.4–10.5)
Calcium: 8.3 mg/dL — ABNORMAL LOW (ref 8.4–10.5)
Chloride: 100 mEq/L (ref 96–112)
Chloride: 91 mEq/L — ABNORMAL LOW (ref 96–112)
Chloride: 94 mEq/L — ABNORMAL LOW (ref 96–112)
Chloride: 96 mEq/L (ref 96–112)
Creatinine, Ser: 0.9 mg/dL (ref 0.4–1.2)
Creatinine, Ser: 0.98 mg/dL (ref 0.4–1.2)
Creatinine, Ser: 1.02 mg/dL (ref 0.4–1.2)
Creatinine, Ser: 1.28 mg/dL — ABNORMAL HIGH (ref 0.4–1.2)
GFR calc Af Amer: >60 mL/min (ref >60)
GFR calc Af Amer: >60 mL/min (ref >60)
GFR calc non Af Amer: 54 mL/min — ABNORMAL LOW (ref >60)
GFR calc non Af Amer: >60 mL/min (ref >60)
Glucose, Bld: 105 mg/dL — ABNORMAL HIGH (ref 70–99)
Glucose, Bld: 107 mg/dL — ABNORMAL HIGH (ref 70–99)
Glucose, Bld: 123 mg/dL — ABNORMAL HIGH (ref 70–99)
Glucose, Bld: 132 mg/dL — ABNORMAL HIGH (ref 70–99)
Potassium: 2.4 mEq/L — CL (ref 3.5–5.1)
Potassium: 3.1 mEq/L — ABNORMAL LOW (ref 3.5–5.1)
Potassium: 3.3 mEq/L — ABNORMAL LOW (ref 3.5–5.1)
Potassium: 4.2 mEq/L (ref 3.5–5.1)
Sodium: 134 mEq/L — ABNORMAL LOW (ref 135–145)
Sodium: 136 mEq/L (ref 135–145)

## 2011-01-19 LAB — CBC
HCT: 28.7 % — ABNORMAL LOW (ref 36.0–46.0)
HCT: 31.1 % — ABNORMAL LOW (ref 36.0–46.0)
HCT: 33.5 % — ABNORMAL LOW (ref 36.0–46.0)
Hemoglobin: 10.3 g/dL — ABNORMAL LOW (ref 12.0–15.0)
MCHC: 29.9 g/dL — ABNORMAL LOW (ref 30.0–36.0)
MCHC: 32.7 g/dL (ref 30.0–36.0)
MCHC: 33.2 g/dL (ref 30.0–36.0)
MCV: 80.1 fL (ref 78.0–100.0)
MCV: 82.6 fL (ref 78.0–100.0)
Platelets: 159 10*3/uL (ref 150–400)
Platelets: 200 10*3/uL (ref 150–400)
Platelets: 210 10*3/uL (ref 150–400)
Platelets: 249 10*3/uL (ref 150–400)
RBC: 3.89 MIL/uL (ref 3.87–5.11)
RDW: 18.2 % — ABNORMAL HIGH (ref 11.5–15.5)
RDW: 18.5 % — ABNORMAL HIGH (ref 11.5–15.5)
RDW: 19.1 % — ABNORMAL HIGH (ref 11.5–15.5)
WBC: 10.3 10*3/uL (ref 4.0–10.5)
WBC: 6.1 10*3/uL (ref 4.0–10.5)

## 2011-01-19 LAB — GLUCOSE, CAPILLARY: Glucose-Capillary: 114 mg/dL — ABNORMAL HIGH (ref 70–99)

## 2011-01-19 LAB — COMPREHENSIVE METABOLIC PANEL
ALT: 25 U/L (ref 0–35)
AST: 23 U/L (ref 0–37)
Albumin: 3 g/dL — ABNORMAL LOW (ref 3.5–5.2)
Albumin: 4 g/dL (ref 3.5–5.2)
Alkaline Phosphatase: 118 U/L — ABNORMAL HIGH (ref 39–117)
BUN: 17 mg/dL (ref 6–23)
Calcium: 9.5 mg/dL (ref 8.4–10.5)
Creatinine, Ser: 0.95 mg/dL (ref 0.4–1.2)
GFR calc Af Amer: >60 mL/min (ref >60)
Potassium: 2.7 mEq/L — CL (ref 3.5–5.1)
Potassium: 3.7 mEq/L (ref 3.5–5.1)
Sodium: 137 mEq/L (ref 135–145)
Total Protein: 6.2 g/dL (ref 6.0–8.3)
Total Protein: 8.5 g/dL — ABNORMAL HIGH (ref 6.0–8.3)

## 2011-01-19 LAB — URINALYSIS, ROUTINE W REFLEX MICROSCOPIC
Hgb urine dipstick: NEGATIVE
Nitrite: NEGATIVE
Protein, ur: 100 mg/dL — AB
Specific Gravity, Urine: 1.029 (ref 1.005–1.030)
Urobilinogen, UA: 1 mg/dL (ref 0.0–1.0)

## 2011-01-19 LAB — DIFFERENTIAL
Basophils Relative: 1 % (ref 0–1)
Lymphs Abs: 1.2 10*3/uL (ref 0.7–4.0)
Monocytes Absolute: 1.3 10*3/uL — ABNORMAL HIGH (ref 0.1–1.0)
Monocytes Relative: 14 % — ABNORMAL HIGH (ref 3–12)
Neutro Abs: 6.8 10*3/uL (ref 1.7–7.7)

## 2011-01-19 LAB — URINE MICROSCOPIC-ADD ON

## 2011-01-19 LAB — BRAIN NATRIURETIC PEPTIDE: Pro B Natriuretic peptide (BNP): 609 pg/mL — ABNORMAL HIGH (ref 0.0–100.0)

## 2011-01-19 LAB — TSH: TSH: 1.117 u[IU]/mL (ref 0.350–4.500)

## 2011-01-19 LAB — URINE CULTURE

## 2011-01-19 LAB — MAGNESIUM: Magnesium: 2.6 mg/dL — ABNORMAL HIGH (ref 1.5–2.5)

## 2011-01-30 ENCOUNTER — Ambulatory Visit: Payer: Self-pay | Admitting: Cardiology

## 2011-02-21 ENCOUNTER — Ambulatory Visit: Payer: Self-pay | Admitting: Cardiology

## 2011-03-11 NOTE — Consult Note (Signed)
NEW PATIENT CONSULTATION   Joanne [name], Joanne [name]  DOB:  [birth year]                                       10/01/2010  ZOXWR#:[id number]   This patient presents today for evaluation of left renal artery  stenosis.  She is a very pleasant 70 year old white female seen for  discussion regarding left renal artery stenosis.  She had a cardiac  catheterization in February 2011 and had a renal artery angiogram at the  same setting.  She subsequently underwent coronary bypass grafting.  She  has had a difficult time since his surgery with multiple problems with  overall  malaise and fatigue, difficulty with breathing, shortness of  breath with exertion.  She does have history of COPD.  She has had a  relatively difficult blood pressure control and is on 3 medications now  with somewhat better control.   PAST MEDICAL HISTORY:  Significant for  elevated cholesterol.  She is  status post left breast lumpectomy for cancer and also laparoscopic  cholecystectomy in the past.   SOCIAL HISTORY:  She is married.  She does not smoke having quit in  February of 2011.  She did have 2 packs a day before that.  She does not  drink alcohol.   FAMILY HISTORY:  Negative for premature atherosclerotic disease.   REVIEW OF SYSTEMS:  She reports weight gain, loss of appetite.  Her  weight is 176 pounds.  She is 5 feet 3 inches tall.  VASCULAR:  Negative.  CARDIAC:  Shortness breath with exertion lying flat.  GI:  Diarrhea.  NEUROLOGIC:  Negative.  PULMONARY:  Positive for wheezing.  ENDOCRINOLOGIC:  Negative.  URINARY:  Positive for frequent urination.  ENT:  Negative.  MUSCULOSKELETAL:  Positive for arthritis and joint pain.  PSYCHIATRIC:  Negative.  SKIN:  Negative.   PHYSICAL EXAMINATION:  Well developed, well nourished white female  appearing stated age in no acute stress.  Blood pressure 110/72, heart  rate is 112, oxygen saturation is 99%, respirations 18.  HEENT is  normal.  Chest:   Clear bilaterally without rales, rhonchi or wheezes.  Heart:  Regular rate and rhythm.  She does have 2+ radial and 2+  dorsalis pedis pulses bilaterally.  Abdominal exam:  Soft, nontender.  I  do not hear any abdominal bruits.  Musculoskeletal:  Shows no major  deformities or cyanosis.  Neurologic:  No focal weakness, paresthesias.  Skin:  Without  ulcers or rashes.   I did review her recent CT scan for evaluation of abdominal pain and  bloating.  She has apparently been diagnosed with diverticulitis or  diverticulosis.  Her CT scan does show some narrowing at the left renal  artery.  I am unable to view her  CT scan  from 06/27/2010.  I am unable  to view her arteriogram from February of 2011.  I will review this at  the hospital and contact this patient.  I had a long discussion with  this patient and her son present explaining the somewhat controversial  treatment of renal artery stenosis.  I explained that this would not  have any impact on her fatigue symptoms but may make her blood pressure  more controlled.  I explained that it is relatively straightforward to  angioplasty and stent the renal arteries and much more difficult to  determine if it  is the appropriate treatment.  She understands  and we  will notify her.  I will discuss this with her after I have had an  opportunity to review her angiogram.     Larina Earthly, M.D.  Electronically Signed   TFE/MEDQ  D:  10/01/2010  T:  10/02/2010  Job:  [id number]   cc:   Colleen Can. Deborah Chalk, M.D.  Leslye Peer, MD  Barry Dienes Eloise Harman, M.D.

## 2011-03-11 NOTE — Assessment & Plan Note (Signed)
OFFICE VISIT   Joanne, [name]  DOB:  [birth year]                                        January 16, 2010  CHART #:  [id number]   HISTORY:  The patient comes in today for 3-week postoperative followup.  She is status post coronary artery bypass grafting x3 on December 21, 2009.  Her postoperative course was complicated by atrial fibrillation,  which ultimately had converted to normal sinus rhythm at the time of  discharge.  Also, she had increased O2 requirement secondary to COPD and  did go home on 4 L of supplemental oxygen.  Since her discharge home,  she has progressed well.  She has been ambulating 2-3 times daily and  had been making good progress up until about 2 days ago.  She has  noticed that in the last 48 hours she has had some worsening shortness  of breath, particularly with exertion, also increased lower extremity  edema.  She was not discharged home on Lasix.  She has not been checking  her weight regularly, but does feel that her weight has increased.  She  saw Dr. Deborah Chalk last week and was noted to be bradycardic with heart  rates in the 50s.  At that time, he decreased her Lopressor from 25 mg  b.i.d. to 12.5 mg b.i.d. and plans to see her back next week for  followup.   PHYSICAL EXAMINATION:  Vital Signs:  Blood pressure is 141/78, pulse is  74, respirations 18, and O2 sat 93% on 4 L.  Chest:  Her sternal chest  tube and lower extremity EVH incisions are all healing well.  Her  sternum is stable to palpation.  Heart:  Regular rate and rhythm without  murmurs, rubs, or gallops.  Lungs:  There are few expiratory wheezes  greater in the right base than the left, overall clear.  Extremities:  Edematous bilaterally.   Chest x-ray shows resolving bilateral pleural effusions.   ASSESSMENT/PLAN:  The patient overall is doing well status post coronary  artery bypass graft.  She was also seen by Dr. Donata Clay today and we  rechecked her O2 sats on 2  L and she was able to maintain sats of 97-98%  here in the office on 2 L.  We will plan on having her decrease her O2  to 2 L per minute and hopefully within the next few weeks, she can be  weaned from supplemental oxygen altogether.  Also in light of her recent  increasing lower extremity edema, he has placed her on Zaroxolyn 5 mg  daily x3 days and Lasix 40 mg daily x2 weeks with potassium 20 mEq daily  x2 weeks.  I have also written her a prescription, refill for oxycodone  IR #40.  She is to continue with sternal precautions and asked to  ambulate daily and continue use of her incentive spirometer.  We will  plan on seeing her back in the office in 2 weeks with a repeat chest x-  ray.   Kerin Perna, M.D.  Electronically Signed   GC/MEDQ  D:  01/16/2010  T:  01/17/2010  Job:  [id number]   cc:   Colleen Can. Deborah Chalk, M.D.  Barry Dienes Eloise Harman, MD

## 2011-03-11 NOTE — Assessment & Plan Note (Signed)
OFFICE VISIT   Joanne [name], Joanne [name]  DOB:  1941-09-27                                       10/09/2010  ZHYQM#:[id number]   The patient had been seen by myself for vascular consultation on  October 01, 2010, regarding possible renal artery stenosis.  I was  unable to evaluate her prior cardiac cath and aortogram done in February  2011 at [facility].  I subsequently reviewed her films.  This does  show moderate stenosis in the proximal left renal artery, approximately  50% in diameter, and certainly is not critical stenosis.  This  correlates with the CT scan that she had as well.  We will communicate  this with the patient by telephone.  I would recommend that we see her  in 6 months with a renal artery duplex to rule out any progression.  I  do not feel that she has significant enough left renal artery stenosis  to explain her hypertension.  I suspect this is essential hypertension.  She continues to have ongoing multiple problems following coronary  bypass grafting many months ago.  I do not feel that we can expect any  improvement with left renal artery angioplasty.  We will see her in 6  months with a renal artery duplex.     Larina Earthly, M.D.  Electronically Signed   TFE/MEDQ  D:  10/09/2010  T:  10/10/2010  Job:  [id number]   cc:   Colleen Can. Deborah Chalk, M.D.  Leslye Peer, MD  Barry Dienes Eloise Harman, M.D.

## 2011-03-11 NOTE — Assessment & Plan Note (Signed)
OFFICE VISIT   Joanne, [name]  DOB:  1941/10/09                                        Feb 27, 2010  CHART #:  [id number]   HISTORY:  The patient is status post coronary artery bypass grafting x3  done by Dr. Donata Clay, December 21, 2009.  She was seen in the office  for her 3-week postoperative visit on January 16, 2010.  At that time, the  patient was still on oxygen with O2 saturations maintaining 97-98% on 2  L.  She still had some lower extremity edema.  She was given a  prescription for Zaroxolyn, Lasix, and potassium at that time.  The  patient states that several days following starting these medications,  she was seen at Dr. Ronnald Nian office who ended up admitting her to the  hospital for dehydration and hypokalemia.  The patient states during  this time they gave her some fluids and replaced her potassium and she  was able to be weaned off oxygen while in the hospital.  She was feeling  better and was in the hospital for 4 days and then discharged to home.  She presents today for her 2-week followup visit from her last office  visit.  The patient is progressing well.  She is ambulating without  difficulty and minimal shortness of breath at the end of each  ambulation.  She is tolerating regular diet, drinking plenty of fluids.  She denies any fevers, nausea, vomiting, or opening or drainage from any  of her incision sites.  She does still complain of some incisional  discomfort as well as discomfort at the chest tube sites.  She is  currently off her oxycodone.  The patient has not started yet cardiac  rehab and states she has not yet heard from them.   PHYSICAL EXAMINATION:  Vital Signs:  Blood pressure 147/91, pulse of 85,  respirations of 18, and O2 saturation 98% on room air.  Respiratory:  Clear to auscultation bilaterally.  Cardiac:  Regular rate and rhythm.  Abdomen:  Benign.  Positive bowel sounds.  Extremities:  No edema noted.  All  extremities are warm and well perfused.  Incisions:  All incisions  are clean, dry, and intact and healing well.   STUDIES:  The patient had PA and lateral chest x-ray done today showing  a stable chest x-ray with slight hyperaeration and mild cardiomegaly.  No change and biapical pleural parenchymal scarring noted.   IMPRESSION AND PLAN:  The patient was seen and evaluated by Dr. Donata Clay.  The patient is noted to be progressing quite well.  At this  point, the patient is told still no heavy lifting over 10 pounds until  March 27, 2010.  After this, the patient can slowly start to lift as well  as any abdominal exercises.  She is told to continue ambulating 3-4  times per day.  She was given a prescription for Vicodin to take 1-2  tablets q.4-6 h. as needed for pain, total #12 as well as Ultram 50 mg 1-  2 tablets q.4-6 h. p.r.n. pain, total #30.  At this point, Dr. Donata Clay  feels that the patient is stable from surgical standpoint and will  release the patient from the office.  The patient is to follow up with  Dr. Deborah Chalk  and Dr. Eloise Harman as directed.  Our office will contact  cardiac rehab, we will then contact the patient to arrange for  outpatient cardiac rehab.  The patient is instructed if she has any  surgical questions, she is to contact us.  The patient is in agreement.   Kerin Perna, M.D.  Electronically Signed   KMD/MEDQ  D:  02/27/2010  T:  02/28/2010  Job:  [id number]   cc:   Colleen Can. Deborah Chalk, M.D.  Barry Dienes Eloise Harman, M.D.

## 2011-03-13 ENCOUNTER — Encounter: Payer: Self-pay | Admitting: Cardiology

## 2011-03-13 ENCOUNTER — Ambulatory Visit (INDEPENDENT_AMBULATORY_CARE_PROVIDER_SITE_OTHER): Payer: Medicare Other | Admitting: Cardiology

## 2011-03-13 VITALS — BP 130/64 | HR 68 | Ht 62.0 in | Wt 177.0 lb

## 2011-03-13 DIAGNOSIS — I2510 Atherosclerotic heart disease of native coronary artery without angina pectoris: Secondary | ICD-10-CM

## 2011-03-13 DIAGNOSIS — Z79899 Other long term (current) drug therapy: Secondary | ICD-10-CM

## 2011-03-13 LAB — BASIC METABOLIC PANEL
CO2: 25 mEq/L (ref 19–32)
Glucose, Bld: 84 mg/dL (ref 70–99)
Potassium: 5.3 mEq/L — ABNORMAL HIGH (ref 3.5–5.1)
Sodium: 142 mEq/L (ref 135–145)

## 2011-03-13 LAB — CBC WITH DIFFERENTIAL/PLATELET
Basophils Absolute: 0 10*3/uL (ref 0.0–0.1)
Eosinophils Absolute: 0.1 10*3/uL (ref 0.0–0.7)
HCT: 34.3 % — ABNORMAL LOW (ref 36.0–46.0)
Hemoglobin: 11.4 g/dL — ABNORMAL LOW (ref 12.0–15.0)
Lymphs Abs: 0.9 10*3/uL (ref 0.7–4.0)
MCHC: 33.1 g/dL (ref 30.0–36.0)
MCV: 80.9 fl (ref 78.0–100.0)
Monocytes Absolute: 0.6 10*3/uL (ref 0.1–1.0)
Neutro Abs: 6.8 10*3/uL (ref 1.4–7.7)
RDW: 16.7 % — ABNORMAL HIGH (ref 11.5–14.6)

## 2011-03-13 NOTE — Progress Notes (Signed)
Subjective:   Joanne [name] is seen today for followup visit. She a coronary artery bypass grafting in February 2011. She had a left internal mammary artery to the LAD, vein graft to 2, and a graft the right cornea artery. She has known renal artery stenosis. Her other problems include hypertension, COPD, past history of tobacco abuse, and postoperative atrial fibrillation and anemia.  She left breast cancer treated in 1999 with lumpectomy. She a cholecystectomy in 2001 and hysterectomy 1993.  She had a CT scan of the abdomen in March of 2011 which was normal. She a CT scan of the chest at that time which showed a stable 3 mm lung nodule.  Today she is concerned about fullness in her neck but is had a negative CT scan of the LAD in its healthy fatty tissue. She has continued to gain weight  Current Outpatient Prescriptions  Medication Sig Dispense Refill  . furosemide (LASIX) 40 MG tablet Take 40 mg by mouth 2 (two) times daily.        Marland Kitchen lisinopril (PRINIVIL,ZESTRIL) 5 MG tablet Take 5 mg by mouth daily.        . metoprolol tartrate (LOPRESSOR) 25 MG tablet Take 25 mg by mouth daily.        . potassium chloride SA (K-DUR,KLOR-CON) 20 MEQ tablet Take 20 mEq by mouth 2 (two) times daily.        . rosuvastatin (CRESTOR) 40 MG tablet Take 40 mg by mouth daily.        . verapamil (COVERA HS) 240 MG (CO) 24 hr tablet Take 240 mg by mouth at bedtime.          Allergies  Allergen Reactions  . Iohexol      Code: SOB, Desc: ct @ ser w/ throat swelling & sob, ok w/ 13 hr prep//a.c., Onset Date: [id number]     Patient Active Problem List  Diagnoses  . NEOPLASM, MALIGNANT, BREAST, LEFT  . TOBACCO ABUSE  . HYPERTENSION  . ALLERGIC RHINITIS  . EMPHYSEMA  . COPD  . PULMONARY NODULE  . ANEMIA  . BEN HYPERTENSIVE HEART DISEASE W/HEART FAILURE  . CAD    History  Smoking status  . Former Smoker -- 1.5 packs/day for 50 years  . Types: Cigarettes  . Quit date: 01/01/2010  Smokeless tobacco  . Never Used     History  Alcohol Use No    No family history on file.  Review of Systems:   The patient denies any heat or cold intolerance.  No weight gain or weight loss.  The patient denies headaches or blurry vision.  There is no cough or sputum production.  The patient denies dizziness.  There is no hematuria or hematochezia.  The patient denies any muscle aches or arthritis.  The patient denies any rash.  The patient denies frequent falling or instability.  There is no history of depression or anxiety.  All other systems were reviewed and are negative.   Physical Exam:   Blood pressure 1:30 or 64. Heart rate 68. Weight is 177. There is fullness in the supraclavicular area bilaterally and is soft and fleshy and not consistent with cancer or lymphadenopathy. It does not appear to be vascular.lungs show prolonged expiratory phase. Lungs are clear otherwise.The head is normocephalic and atraumatic.  Pupils are equally round and reactive to light.  Sclerae nonicteric.  Conjunctiva is clear.  Oropharynx is unremarkable.  There's adequate oral airway.  Neck is supple there are no masses.  Thyroid  is not enlarged.  There is no lymphadenopathy.    Chest is symmetric.  Heart shows a regular rate and rhythm.  S1 and S2 are normal.  There is no murmur click or gallop.  Abdomen is soft normal bowel sounds.  There is no organomegaly.  Genital and rectal deferred.  Extremities are without edema.  Peripheral pulses are adequate.  Neurologically intact.  Full range of motion.  The patient is not depressed.  Skin is warm and dry.  Assessment / Plan:

## 2011-03-13 NOTE — Assessment & Plan Note (Signed)
She had coronary artery bypass grafting in February 2011 and has done reasonably well since that time. I will encourage her to continue to modify cardiovascular risk factors. She does have underlying hypertension and will have renal artery stenosis addressed by Dr. Arbie Cookey in the next few months. I will have her see Dr. Sanjuana Kava in approximate 8 months.

## 2011-03-14 ENCOUNTER — Telehealth: Payer: Self-pay | Admitting: *Deleted

## 2011-03-14 NOTE — Telephone Encounter (Signed)
Her GFR is less than 30. She could see Renal. Minimize NSAID use.

## 2011-03-14 NOTE — Telephone Encounter (Signed)
RN notified pt of lab results.  Pt is taking one K-Dur 20 meq daily and RN instructed pt to stop it.  Pt is concerned about rising Creatinine.  Dr. Arbie Cookey does not want to stent her renal arteries just yet and she will f/u with him in June.  Pt does have some difficulty urinating at times.  Pt would like to know what to do about rising creatinine levels.

## 2011-03-14 NOTE — Telephone Encounter (Signed)
Pt instructed to minimize NSAID use and that our office would set up a renal appointment.  Pt would like to know what is causing her kidney function to worsen.  Dr. Deborah Chalk notified and thinks that the renal artery stenosis is probably the issue.  Pt notified of Dr. Ronnald Nian instructions and will f/u Dr. Arbie Cookey in June.  Pt will call Dr. Bosie Helper office to see if she needs to be seen sooner.  RN faxed results of labs to Dr. Arbie Cookey.

## 2011-03-14 NOTE — Op Note (Signed)
NAME:  NESTA, SCATURRO                             ACCOUNT NO.:  [id number]   MEDICAL RECORD NO.:  [id number]                   PATIENT TYPE:  OIB   LOCATION:  5725                                 FACILITY:  [facility]   PHYSICIAN:  Gita Kudo, M.D.              DATE OF BIRTH:  November 02, 1940   DATE OF PROCEDURE:  03/24/2003  DATE OF DISCHARGE:  03/25/2003                                 OPERATIVE REPORT   OPERATION:  Laparoscopic cholecystectomy with intraoperative cholangiogram.   SURGEON:  Gita Kudo, M.D.   ASSISTANT:  __________ .   ANESTHESIA:  General endotracheal.   PREOPERATIVE DIAGNOSIS:  Gallstones.   POSTOPERATIVE DIAGNOSIS:  Gallstones, normal cholangiogram.   CLINICAL SUMMARY:  A 70 year old female with severe bouts of right upper  quadrant abdominal pain requiring visits to the emergency room.  Ultrasound  showed stones.  Liver function studies are basically normal with slight  elevation of lipase and SGOT.   FINDINGS:  The gallbladder was thin walled, cholangiogram looked normal.  Cystic duct and cystic artery appeared normal in size.   PROCEDURE IN DETAIL:  Under satisfactory general endotracheal anesthesia the  patient's abdomen was prepped and draped in a standard fashion.  She did not  receive antibiotics because of a hive allergy to penicillin.  A vertical  incision was made at the umbilicus and carried down beneath it into the  peritoneum controlled with a figure-of-eight 0 Vicryl suture.  Operating  Hasson port inserted and good CO2 pneumoperitoneum established.  Two  Marcaine infiltrated skin incisions, two #5 ports placed laterally and a  second #10 medially.  Graspers in the lateral port gave excellent exposure  and I carefully dissected the cystic duct gallbladder junction and the  cystic artery.  When these structures were each circumferentially dissected  and identified the artery was controlled with multiple clips and divided and  a single clip  placed on the cystic duct at the gallbladder.  Incision made  in the cystic duct and a percutaneously placed catheter used to obtain good  cholangiograms.  After the films were reviewed the catheter removed and the  duct controlled with metal clips and divided.  Gallbladder then removed from  below upward using coagulating spatula for hemostasis and dissection.  The  liver bed was made hemostatic by cautery and lavaged with saline.  Following  this the gallbladder was removed through the umbilical port after the  cannula was moved to the upper port with a large grasper and there was no  spillage or complication.  Then the operative site was again checked for  hemostasis, lavaged and suctioned dry.  An adhesion in the right lower  quadrant was sharply dissected to avoid any postop problem and then ports  removed and CO2 released.  The midline was closed with the previous  figure-of-eight and a second interrupted 0 Vicryl suture and then also  reapproximated with 4-0 Vicryl and Steri-Strips for the skin.  Sterile  dressings were applied.  The patient went to the recovery room from the  operating room in good condition without complication.                                               Gita Kudo, M.D.    MRL/MEDQ  D:  03/24/2003  T:  03/26/2003  Job:  [id number]   cc:   Barry Dienes. Eloise Harman, M.D.  [address]  [place]  [place] [address]  Fax: [phone]

## 2011-03-14 NOTE — Telephone Encounter (Signed)
Message copied by Barnetta Hammersmith on Fri Mar 14, 2011  9:20 AM ------      Message from: Roger Shelter      Created: Fri Mar 14, 2011  8:56 AM       Check to make sure pt is not on potassium.

## 2011-04-21 ENCOUNTER — Telehealth: Payer: Self-pay | Admitting: *Deleted

## 2011-04-21 NOTE — Telephone Encounter (Signed)
Larita Fife from [facility] will get the pt in within a month for a renal consultation for elevated Creatinine.   Larita Fife will call the pt with appointment.

## 2011-04-22 ENCOUNTER — Ambulatory Visit: Payer: Self-pay | Admitting: Vascular Surgery

## 2011-04-22 ENCOUNTER — Other Ambulatory Visit: Payer: Self-pay

## 2011-04-28 ENCOUNTER — Telehealth: Payer: Self-pay | Admitting: Cardiology

## 2011-04-28 NOTE — Telephone Encounter (Signed)
Patient has an appointment to see Dr. Henderson Baltimore 05/01/11 9:00 am and the patient is aware of the date and time.

## 2011-05-02 ENCOUNTER — Telehealth: Payer: Self-pay | Admitting: Cardiology

## 2011-05-02 NOTE — Telephone Encounter (Signed)
Wanted you to know that she did not come for appointment

## 2011-05-06 ENCOUNTER — Other Ambulatory Visit: Payer: Self-pay

## 2011-05-06 ENCOUNTER — Ambulatory Visit: Payer: Self-pay | Admitting: Vascular Surgery

## 2011-06-10 ENCOUNTER — Other Ambulatory Visit (INDEPENDENT_AMBULATORY_CARE_PROVIDER_SITE_OTHER): Payer: Medicare Other

## 2011-06-10 ENCOUNTER — Ambulatory Visit (INDEPENDENT_AMBULATORY_CARE_PROVIDER_SITE_OTHER): Payer: Medicare Other | Admitting: Vascular Surgery

## 2011-06-10 ENCOUNTER — Encounter: Payer: Self-pay | Admitting: Vascular Surgery

## 2011-06-10 VITALS — BP 195/70 | HR 75 | Temp 97.8°F | Ht 63.0 in | Wt 179.0 lb

## 2011-06-10 DIAGNOSIS — I701 Atherosclerosis of renal artery: Secondary | ICD-10-CM

## 2011-06-10 NOTE — Progress Notes (Signed)
Subjective:     Patient ID: Joanne [name], female   DOB: Dec 21, 1940, 70 y.o.   MRN: [id number]  HPI The patient presents today for followup of left moderate renal artery stenosis. This has been documented in the past as a non-critical stenosis. She has well-controlled hypertension. She has no history of renal dysfunction.  ROS: 10 point Review of Systems is negative except for  Review of Systems  Respiratory: Positive for choking, chest tightness and shortness of breath.   Cardiovascular: Positive for leg swelling.  Genitourinary: Positive for urgency.  Musculoskeletal: Positive for arthralgias.  Hematological: Bruises/bleeds easily.   Past Medical History  Diagnosis Date  . Renal artery stenosis   . Hyperlipidemia   . Hypertension   . Cancer     Left breast  . COPD (chronic obstructive pulmonary disease)   . Arthritis   . Atrial fibrillation     History  Substance Use Topics  . Smoking status: Former Smoker -- 1.5 packs/day for 50 years    Types: Cigarettes    Quit date: 01/01/2010  . Smokeless tobacco: Never Used  . Alcohol Use: No    History reviewed. No pertinent family history.  Allergies  Allergen Reactions  . Iohexol      Code: SOB, Desc: ct @ ser w/ throat swelling & sob, ok w/ 13 hr prep//a.c., Onset Date: [id number]     Current outpatient prescriptions:furosemide (LASIX) 40 MG tablet, Take 40 mg by mouth 2 (two) times daily.  , Disp: , Rfl: ;  lisinopril (PRINIVIL,ZESTRIL) 5 MG tablet, Take 10 mg by mouth daily. , Disp: , Rfl: ;  metoprolol tartrate (LOPRESSOR) 25 MG tablet, Take 25 mg by mouth 2 (two) times daily. , Disp: , Rfl: ;  rosuvastatin (CRESTOR) 40 MG tablet, Take 40 mg by mouth daily.  , Disp: , Rfl:  verapamil (COVERA HS) 240 MG (CO) 24 hr tablet, Take 240 mg by mouth at bedtime.  , Disp: , Rfl: ;  dicyclomine (BENTYL) 10 MG capsule, , Disp: , Rfl: ;  potassium chloride SA (K-DUR,KLOR-CON) 20 MEQ tablet, Take 20 mEq by mouth 2 (two) times daily.  ,  Disp: , Rfl:   Filed Vitals:   06/10/11 1116  Height: 5\' 3"  (1.6 m)  Weight: 179 lb (81.194 kg)    Body mass index is 31.71 kg/(m^2).          Objective:   Physical Exam Well-developed well-nourished white female in no acute distress. No abdominal bruits. Palpable radial femoral popliteal and dorsalis pedis pulses bilaterally  Renal artery duplex: No evidence of significant renal artery stenosis bilaterally. Normal renal size bilaterally some turbulence in the mid left renal artery.    Assessment:     Moderate left renal artery stenosis    Plan:     Repeat renal duplex in one year

## 2011-06-12 ENCOUNTER — Other Ambulatory Visit: Payer: Self-pay | Admitting: Cardiology

## 2011-06-12 NOTE — Telephone Encounter (Signed)
escribe request  

## 2011-06-20 NOTE — Procedures (Unsigned)
RENAL ARTERY DUPLEX EVALUATION  INDICATION:  Followup left renal artery stenosis.  HISTORY: Diabetes: Cardiac:  Yes. Hypertension:  Yes. Smoking:  No.  RENAL ARTERY DUPLEX FINDINGS: Aorta-Proximal:  58 cm/s Aorta-Mid:  113 cm/s Aorta-Distal:  124 cm/s Celiac Artery Origin:  106 cm/s SMA Origin:  117 cm/s                                   RIGHT               LEFT Renal Artery Origin:             89 cm/s             121 cm/s Renal Artery Proximal:           85 cm/s             124 cm/s Renal Artery Mid:                101 cm/s            68 cm/s Renal Artery Distal:             NV                  71 cm/s Hilar Acceleration Time (AT):    m/s2                m/s2 Renal-Aortic Ratio (RAR):        1.7                 2.1 Kidney Size:                     9.16 cm             9.10 cm End Diastolic Ratio (EDR): Resistive Index (RI):            0.77/0.61           0.73/0.71  IMPRESSION: 1. No evidence of significant (>60%) renal artery stenosis     bilaterally.  However, the left mid renal artery waveform is     turbulent with mild dampening in the distal segment. 2. Bilateral resistive indices are borderline normal/increased. 3. Bilateral kidney size is within normal limits. 4. Bilateral renal aortic ratio is within normal limits.  ___________________________________________ Larina Earthly, M.D.  LT/MEDQ  D:  06/10/2011  T:  06/10/2011  Job:  [id number]

## 2011-07-03 ENCOUNTER — Other Ambulatory Visit: Payer: Self-pay | Admitting: *Deleted

## 2011-07-03 MED ORDER — LISINOPRIL 10 MG PO TABS: 10.0000 mg | ORAL_TABLET | Freq: Every day | ORAL | Status: DC

## 2011-07-03 NOTE — Telephone Encounter (Signed)
escribe medication per fax request  

## 2011-07-23 ENCOUNTER — Other Ambulatory Visit: Payer: Self-pay | Admitting: Cardiology

## 2011-10-14 ENCOUNTER — Other Ambulatory Visit: Payer: Self-pay | Admitting: Cardiology

## 2011-11-25 ENCOUNTER — Ambulatory Visit: Payer: Medicare Other | Admitting: Cardiovascular Disease

## 2011-12-18 ENCOUNTER — Encounter: Payer: Self-pay | Admitting: Cardiovascular Disease

## 2011-12-18 ENCOUNTER — Ambulatory Visit (INDEPENDENT_AMBULATORY_CARE_PROVIDER_SITE_OTHER): Payer: Medicare Other | Admitting: Cardiovascular Disease

## 2011-12-18 VITALS — BP 130/80 | HR 76 | Ht 62.0 in | Wt 179.0 lb

## 2011-12-18 DIAGNOSIS — I2510 Atherosclerotic heart disease of native coronary artery without angina pectoris: Secondary | ICD-10-CM

## 2011-12-18 DIAGNOSIS — I10 Essential (primary) hypertension: Secondary | ICD-10-CM

## 2011-12-18 DIAGNOSIS — I701 Atherosclerosis of renal artery: Secondary | ICD-10-CM | POA: Insufficient documentation

## 2011-12-18 DIAGNOSIS — E785 Hyperlipidemia, unspecified: Secondary | ICD-10-CM | POA: Insufficient documentation

## 2011-12-18 MED ORDER — PANTOPRAZOLE SODIUM 40 MG PO TBEC: 40.0000 mg | DELAYED_RELEASE_TABLET | Freq: Every day | ORAL | Status: DC

## 2011-12-18 NOTE — Assessment & Plan Note (Signed)
Followed in [facility] by Dr. Arbie Cookey.

## 2011-12-18 NOTE — Assessment & Plan Note (Signed)
BP well controlled. No changes.  

## 2011-12-18 NOTE — Assessment & Plan Note (Signed)
Stable No changes 

## 2011-12-18 NOTE — Progress Notes (Signed)
History of Present Illness:71 yo WF with history of CAD s/p CABG in 2011, renal artery stenosis, HTN, HLD, COPD, former tobacco abuse, breast cancer s/p lumpectomy 1997, post-op atrial fibrillation and anemia here today for cardiac follow up. She has been followed in the past by Dr Deborah Chalk. He last saw her in May 2012. Her coronary artery bypass grafting was in February 2011. She had a left internal mammary artery to the LAD, vein graft to OM2, and a vein graft to  the RCA. She has known renal artery stenosis. She left breast cancer treated in 1999 with lumpectomy. She a cholecystectomy in 2001 and hysterectomy 1993. She had a CT scan of the abdomen in March of 2011 which was normal. She a CT scan of the chest at that time which showed a stable 3 mm lung nodule. Last echo November 2011 with normal LV function with LVEF of 70%, severe LVH, thickened MV. Dr Early follows her renal artery stenosis. She is due to see him again soon. No prior renal artery interventions.   She tells me today that she has been doing well but still having some abdominal bloating. She has irritable bowel syndrome but can't have a colonoscopy because of issues with sedation per pt. She denies any chest pain. She has baseline SOB. No dizziness, near syncope or syncope.   Pulmonary: Levy Pupa  Primary Care Physician: Jarome Matin  Last Lipid Profile: Lipids are followed in primary care.   Past Medical History  Diagnosis Date  . Renal artery stenosis   . Hyperlipidemia   . Hypertension   . Cancer     Left breast  . COPD (chronic obstructive pulmonary disease)   . Arthritis   . Atrial fibrillation   . Coronary artery disease     3V CABG February 2011    Past Surgical History  Procedure Date  . Breast surgery     left lumpectomy for Cancer  . Coronary artery bypass graft 12/21/2009    Cabg X3  Dr. Donata Clay  . Tubal ligation   . Cholecystectomy     Laparoscopic    Current Outpatient Prescriptions    Medication Sig Dispense Refill  . amoxicillin-clavulanate (AUGMENTIN) 875-125 MG per tablet Take 1 tablet by mouth 2 (two) times daily.      Marland Kitchen aspirin 81 MG chewable tablet Chew 81 mg by mouth daily.      . CRESTOR 40 MG tablet TAKE 1 TABLET EVERY EVENING  30 tablet  5  . dicyclomine (BENTYL) 10 MG capsule       . furosemide (LASIX) 40 MG tablet TAKE 1 TABLET EVERY DAY  30 tablet  5  . lisinopril (PRINIVIL,ZESTRIL) 10 MG tablet Take 1 tablet (10 mg total) by mouth daily.  30 tablet  4  . metoprolol tartrate (LOPRESSOR) 25 MG tablet Take 25 mg by mouth 2 (two) times daily.       . verapamil (COVERA HS) 240 MG (CO) 24 hr tablet Take 240 mg by mouth at bedtime.          Allergies  Allergen Reactions  . Iohexol      Code: SOB, Desc: ct @ ser w/ throat swelling & sob, ok w/ 13 hr prep//a.c., Onset Date: 16109604     History   Social History  . Marital Status: Married    Spouse Name: N/A    Number of Children: 6  . Years of Education: N/A   Occupational History  . Retired-[employer]  Social History Main Topics  . Smoking status: Former Smoker -- 1.5 packs/day for 50 years    Types: Cigarettes    Quit date: 01/01/2010  . Smokeless tobacco: Never Used  . Alcohol Use: No  . Drug Use: No  . Sexually Active: Not on file   Other Topics Concern  . Not on file   Social History Narrative  . No narrative on file    Family History  Problem Relation Age of Onset  . Heart failure Mother   . COPD Father   . Heart failure Grandchild   . Heart attack Brother     Review of Systems:  As stated in the HPI and otherwise negative.   BP 130/80  Pulse 76  Ht 5\' 2"  (1.575 m)  Wt 179 lb (81.194 kg)  BMI 32.74 kg/m2  Physical Examination: General: Well developed, well nourished, NAD HEENT: OP clear, mucus membranes moist SKIN: warm, dry. No rashes. Neuro: No focal deficits Musculoskeletal: Muscle strength 5/5 all ext Psychiatric: Mood and affect normal Neck: No JVD, no carotid  bruits, no thyromegaly, no lymphadenopathy. Lungs:Clear bilaterally, no wheezes, rhonci, crackles Cardiovascular: Regular rate and rhythm. No murmurs, gallops or rubs. Abdomen:Soft. Bowel sounds present. Non-tender.  Extremities: No lower extremity edema. Pulses are 2 + in the bilateral DP/PT.  EKG: NSR, Incomplete RBBB, Non-specific ST abnormality.   Echo 09/18/10:  Left ventricle: The cavity size was normal. Wall thickness was increased in a pattern of severe LVH. Systolic function was vigorous. The estimated ejection fraction was in the range of 65% to 70%. Wall motion was normal; there were no regional wall motion abnormalities. Doppler parameters are consistent with abnormal left ventricular relaxation (grade 1 diastolic dysfunction). - Mitral valve: Calcified annulus. Moderately thickened leaflets . Mild regurgitation. - Left atrium: The atrium was moderately dilated. - Atrial septum: No defect or patent foramen ovale was identified. - Pulmonary arteries: PA peak pressure: 32mm Hg (S).

## 2011-12-18 NOTE — Patient Instructions (Signed)
Your physician wants you to follow-up in:  6 months. You will receive a reminder letter in the mail two months in advance. If you don't receive a letter, please call our office to schedule the follow-up appointment.  Your physician has recommended you make the following change in your medication: Start Protonix 40 mg by mouth daily     

## 2011-12-18 NOTE — Assessment & Plan Note (Signed)
She is on a statin. Lipids are followed in primary care.

## 2011-12-25 ENCOUNTER — Other Ambulatory Visit: Payer: Self-pay | Admitting: Emergency Medicine

## 2011-12-25 NOTE — Telephone Encounter (Signed)
Pt has not been seen since 2011 and will need to scheduled an OV for refills.

## 2011-12-31 ENCOUNTER — Telehealth: Payer: Self-pay | Admitting: Emergency Medicine

## 2011-12-31 NOTE — Telephone Encounter (Signed)
I spoke with the pt and she is c/o increased SOB with minimal activity. Pt last seen 09-2010 so I advised she needs an appt. Appt set for Friday at 2:15. Carron Curie, CMA

## 2012-01-02 ENCOUNTER — Ambulatory Visit: Payer: Medicare Other | Admitting: Adult Health

## 2012-01-15 ENCOUNTER — Ambulatory Visit: Payer: Medicare Other | Admitting: Adult Health

## 2012-02-05 ENCOUNTER — Other Ambulatory Visit: Payer: Self-pay | Admitting: Nurse Practitioner

## 2012-02-05 NOTE — Telephone Encounter (Signed)
Refilled lisinopril

## 2012-02-11 ENCOUNTER — Other Ambulatory Visit: Payer: Self-pay | Admitting: Internal Medicine

## 2012-02-11 DIAGNOSIS — R1032 Left lower quadrant pain: Secondary | ICD-10-CM

## 2012-02-13 ENCOUNTER — Ambulatory Visit
Admission: RE | Admit: 2012-02-13 | Discharge: 2012-02-13 | Disposition: A | Discharge status: Home / Self Care | Payer: Medicare Other | Source: Ambulatory Visit | Attending: Internal Medicine | Admitting: Internal Medicine

## 2012-02-13 DIAGNOSIS — R1032 Left lower quadrant pain: Secondary | ICD-10-CM

## 2012-02-17 ENCOUNTER — Ambulatory Visit: Payer: Medicare Other | Admitting: Adult Health

## 2012-05-19 ENCOUNTER — Telehealth: Payer: Self-pay | Admitting: Cardiovascular Disease

## 2012-05-19 NOTE — Telephone Encounter (Signed)
New msg Pt said her pcp took her off fluid meds and now she is swelling.

## 2012-05-19 NOTE — Telephone Encounter (Signed)
Appt made to see Flavia Shipper, NP tomorrow, 05/20/2012.

## 2012-05-19 NOTE — Telephone Encounter (Signed)
LM for pt to call back.

## 2012-05-19 NOTE — Telephone Encounter (Signed)
Fu call °Pt returning your call  °

## 2012-05-20 ENCOUNTER — Encounter: Payer: Self-pay | Admitting: Nurse Practitioner

## 2012-05-20 ENCOUNTER — Ambulatory Visit (INDEPENDENT_AMBULATORY_CARE_PROVIDER_SITE_OTHER): Payer: Medicare Other | Admitting: Nurse Practitioner

## 2012-05-20 VITALS — BP 146/78 | HR 56 | Ht 63.0 in | Wt 183.0 lb

## 2012-05-20 DIAGNOSIS — I5030 Unspecified diastolic (congestive) heart failure: Secondary | ICD-10-CM

## 2012-05-20 DIAGNOSIS — I5032 Chronic diastolic (congestive) heart failure: Secondary | ICD-10-CM

## 2012-05-20 DIAGNOSIS — I509 Heart failure, unspecified: Secondary | ICD-10-CM

## 2012-05-20 LAB — BASIC METABOLIC PANEL
CO2: 26 mEq/L (ref 19–32)
Calcium: 9.9 mg/dL (ref 8.4–10.5)
Chloride: 99 mEq/L (ref 96–112)
Creatinine, Ser: 1.4 mg/dL — ABNORMAL HIGH (ref 0.4–1.2)
Sodium: 137 mEq/L (ref 135–145)

## 2012-05-20 MED ORDER — FUROSEMIDE 20 MG PO TABS: 20.0000 mg | ORAL_TABLET | Freq: Every day | ORAL | Status: DC

## 2012-05-20 NOTE — Progress Notes (Signed)
Patient Name: Joanne [name] Date of Encounter: 05/20/2012  Primary Care Provider:  Jarome Matin Primary Cardiologist:  C. Clifton James, MD  Patient Profile  70 y/o female with h/o CAD and diast dysfxn who presents with prog DOE and edema.  Problem List   Past Medical History  Diagnosis Date  . Left renal artery stenosis     a. Moderate L RAS  . Hyperlipidemia   . Hypertension   . Cancer     Left breast  . COPD (chronic obstructive pulmonary disease)   . Arthritis   . Atrial fibrillation   . Coronary artery disease     a. 11/2009 CABGx3: LIMA->LAD, VG->RCA, VG->OM2.  . Chronic diastolic CHF (congestive heart failure)     a. 08/2010 Echo: EF 65-70%, Gr 1 DD, Mild MR   Past Surgical History  Procedure Date  . Breast surgery     left lumpectomy for Cancer  . Coronary artery bypass graft 12/21/2009    Cabg X3  Dr. Donata Clay  . Tubal ligation   . Cholecystectomy     Laparoscopic  . Tonsillectomy     Allergies  Allergies  Allergen Reactions  . Iohexol      Code: SOB, Desc: ct @ ser w/ throat swelling & sob, ok w/ 13 hr prep//a.c., Onset Date: [id number]     HPI  71 y/o female with the above problem list.  She was last seen in clinic in Feb of this year.  In May, her creat was found to be elevated @ 1.8 and her lasix (prev 40 qod) was d/c'd.  Over the past few wks, she has begun to note increasing abd girth, DOE, mild LEE, and a 4 lb weight gain.  She has had no chest pain.  She denies pnd, orthopnea, n, v, dizziness, syncope, or early satiety.  Home Medications  Prior to Admission medications   Medication Sig Start Date End Date Taking? Authorizing Provider  allopurinol (ZYLOPRIM) 100 MG tablet Take 1 tablet by mouth Daily. 05/04/12  Yes Historical Provider, MD  ALPRAZolam Prudy Feeler) 0.25 MG tablet Once daily as needed. 04/27/12  Yes Historical Provider, MD  amoxicillin (AMOXIL) 500 MG capsule as directed. 05/14/12  Yes Historical Provider, MD  amoxicillin-clavulanate  (AUGMENTIN) 875-125 MG per tablet Take 1 tablet by mouth 2 (two) times daily.   Yes Historical Provider, MD  aspirin 81 MG chewable tablet Chew 81 mg by mouth daily.   Yes Historical Provider, MD  CRESTOR 40 MG tablet TAKE 1 TABLET EVERY EVENING 10/14/11  Yes Rosalio Macadamia, NP  dicyclomine (BENTYL) 10 MG capsule Take 20 mg by mouth.  04/02/11  Yes Historical Provider, MD  HYDROcodone-acetaminophen (LORTAB) 7.5-500 MG per tablet as needed. 04/15/12  Yes Historical Provider, MD  metoprolol tartrate (LOPRESSOR) 25 MG tablet Take 25 mg by mouth 2 (two) times daily.    Yes Historical Provider, MD  PROCTOSOL HC 2.5 % rectal cream as directed. 02/22/12  Yes Historical Provider, MD  verapamil (COVERA HS) 240 MG (CO) 24 hr tablet Take 240 mg by mouth at bedtime.     Yes Historical Provider, MD  zolpidem (AMBIEN) 10 MG tablet as needed. 04/26/12  Yes Historical Provider, MD  furosemide (LASIX) 20 MG tablet Take 1 tablet (20 mg total) by mouth daily. 05/20/12 05/20/13  Ok Anis, NP   Review of Systems  + DOE with edema and increasing abd girth as outlined above.  No chest pain, sob, n, v, dizziness, syncope, edema,  early satiety, dysuria, dark stools, blood in stools, diarrhea, rash/skin changes, fevers, chills, wt loss/gain.  Otherwise all systems reviewed and negative.  Physical Exam  Blood pressure 146/78, pulse 56, height 5\' 3"  (1.6 m), weight 183 lb (83.008 kg).  General: Pleasant, NAD Psych: Normal affect. Neuro: Alert and oriented X 3. Moves all extremities spontaneously. HEENT: Normal  Neck: Supple without bruits.  Mildly elevated neck veins.. Lungs:  Resp regular and unlabored, CTA. Heart: RRR no s3, s4, or murmurs. Abdomen: Soft,  non-distended though she says she feels bloated upon palpation.  BS + x 4.  Extremities: No clubbing, cyanosis.  Trace bilat LEE.Marland Kitchen DP/PT/Radials 2+ and equal bilaterally.  Accessory Clinical Findings  ECG - SB, 56, LAE, no acute st/t changes.  Assessment  & Plan  1.  DOE/Acute on chronic diastolic chf:  Pt taken off of lasix in May and now with progressive symptoms of volume overload with evidence of such on exam.  Her lasix was d/c'd b/c of elevation in creatinine but it should also be noted that she has been on an ACEI in the setting of moderate left RAS.  Repeat bmet today.  D/C lisinopril.  Add low dose lasix - 20mg  daily.  F/u BMET again next week.  F/U echo.  F/u in the office in 10-14 days.  2.  CAD:  No chest pain.  Cont asa, statin, bb.  If echo abnl, plan to pursue ischemic eval.  3.  Left RAS:  D/C ACEI and f/u bmet as above.  She is already scheduled for Renal artery duplex on 8/6.  This is followed by Dr. Arbie Cookey.  4.  H/O Afib:  In sinus.  Cont bb/ccb/asa.  5.  HTN:  BP sl elevated today.  Runs better @ home.  She will cont to follow @ home.  May need to adjust verapamil if diuretic in place of ACEI doesn't control pressure.  6.  HL:  Cont statin.  Nicolasa Ducking, NP 05/20/2012, 1:39 PM

## 2012-05-20 NOTE — Patient Instructions (Addendum)
Your physician has recommended you make the following change in your medication: STOP Lisinopril; START Lasix 20 mg, take one tablet every day.    Please have blood work done today:  BMET; repeat this lab test in 1 week around 05/27/2012  Your physician has requested that you have an echocardiogram. Echocardiography is a painless test that uses sound waves to create images of your heart. It provides your doctor with information about the size and shape of your heart and how well your heart's chambers and valves are working. This procedure takes approximately one hour. There are no restrictions for this procedure.  Your physician recommends that you schedule a follow-up appointment in: 3 weeks with Ward Givens, NP.

## 2012-05-21 ENCOUNTER — Telehealth: Payer: Self-pay | Admitting: *Deleted

## 2012-05-21 NOTE — Telephone Encounter (Signed)
Called patient and spoke with her regarding lab results.  Pt aware.  Vista Mink, CMA

## 2012-05-21 NOTE — Telephone Encounter (Signed)
Message copied by Awilda Bill on Fri May 21, 2012  9:49 AM ------      Message from: Nicolasa Ducking R      Created: Thu May 20, 2012  5:50 PM       Creat improved c/w May.  F/u in 1 wk as planned.

## 2012-05-21 NOTE — Telephone Encounter (Signed)
Message copied by Awilda Bill on Fri May 21, 2012  9:50 AM ------      Message from: Nicolasa Ducking R      Created: Thu May 20, 2012  5:50 PM       Creat improved c/w May.  F/u in 1 wk as planned.

## 2012-05-27 ENCOUNTER — Ambulatory Visit (HOSPITAL_COMMUNITY): Payer: Medicare Other | Attending: Internal Medicine | Admitting: Radiology

## 2012-05-27 ENCOUNTER — Other Ambulatory Visit (INDEPENDENT_AMBULATORY_CARE_PROVIDER_SITE_OTHER): Payer: Medicare Other

## 2012-05-27 DIAGNOSIS — R0989 Other specified symptoms and signs involving the circulatory and respiratory systems: Secondary | ICD-10-CM | POA: Insufficient documentation

## 2012-05-27 DIAGNOSIS — M7989 Other specified soft tissue disorders: Secondary | ICD-10-CM | POA: Insufficient documentation

## 2012-05-27 DIAGNOSIS — I080 Rheumatic disorders of both mitral and aortic valves: Secondary | ICD-10-CM | POA: Insufficient documentation

## 2012-05-27 DIAGNOSIS — I079 Rheumatic tricuspid valve disease, unspecified: Secondary | ICD-10-CM | POA: Insufficient documentation

## 2012-05-27 DIAGNOSIS — I5030 Unspecified diastolic (congestive) heart failure: Secondary | ICD-10-CM

## 2012-05-27 DIAGNOSIS — R0602 Shortness of breath: Secondary | ICD-10-CM

## 2012-05-27 DIAGNOSIS — I4891 Unspecified atrial fibrillation: Secondary | ICD-10-CM | POA: Insufficient documentation

## 2012-05-27 DIAGNOSIS — I509 Heart failure, unspecified: Secondary | ICD-10-CM | POA: Insufficient documentation

## 2012-05-27 DIAGNOSIS — J449 Chronic obstructive pulmonary disease, unspecified: Secondary | ICD-10-CM | POA: Insufficient documentation

## 2012-05-27 DIAGNOSIS — I2510 Atherosclerotic heart disease of native coronary artery without angina pectoris: Secondary | ICD-10-CM | POA: Insufficient documentation

## 2012-05-27 DIAGNOSIS — J4489 Other specified chronic obstructive pulmonary disease: Secondary | ICD-10-CM | POA: Insufficient documentation

## 2012-05-27 DIAGNOSIS — I10 Essential (primary) hypertension: Secondary | ICD-10-CM | POA: Insufficient documentation

## 2012-05-27 DIAGNOSIS — E785 Hyperlipidemia, unspecified: Secondary | ICD-10-CM | POA: Insufficient documentation

## 2012-05-27 DIAGNOSIS — R0609 Other forms of dyspnea: Secondary | ICD-10-CM | POA: Insufficient documentation

## 2012-05-27 LAB — BASIC METABOLIC PANEL
BUN: 31 mg/dL — ABNORMAL HIGH (ref 6–23)
Chloride: 101 mEq/L (ref 96–112)
GFR: 40.55 mL/min — ABNORMAL LOW (ref >60.00)
Glucose, Bld: 132 mg/dL — ABNORMAL HIGH (ref 70–99)
Potassium: 4.4 mEq/L (ref 3.5–5.1)
Sodium: 138 mEq/L (ref 135–145)

## 2012-05-27 NOTE — Progress Notes (Signed)
Echocardiogram performed.  

## 2012-05-28 ENCOUNTER — Other Ambulatory Visit: Payer: Self-pay | Admitting: *Deleted

## 2012-05-28 MED ORDER — ROSUVASTATIN CALCIUM 40 MG PO TABS: 40.0000 mg | ORAL_TABLET | Freq: Every day | ORAL | Status: DC

## 2012-05-31 ENCOUNTER — Encounter: Payer: Self-pay | Admitting: Neurosurgery

## 2012-06-01 ENCOUNTER — Other Ambulatory Visit: Payer: Medicare Other

## 2012-06-01 ENCOUNTER — Ambulatory Visit: Payer: Medicare Other | Admitting: Neurosurgery

## 2012-06-08 ENCOUNTER — Other Ambulatory Visit: Payer: Medicare Other

## 2012-06-10 ENCOUNTER — Ambulatory Visit: Payer: Medicare Other | Admitting: Nurse Practitioner

## 2012-06-16 ENCOUNTER — Other Ambulatory Visit: Payer: Self-pay | Admitting: *Deleted

## 2012-06-16 DIAGNOSIS — I701 Atherosclerosis of renal artery: Secondary | ICD-10-CM

## 2012-06-21 ENCOUNTER — Encounter: Payer: Self-pay | Admitting: Neurosurgery

## 2012-06-22 ENCOUNTER — Other Ambulatory Visit: Payer: Medicare Other

## 2012-06-22 ENCOUNTER — Ambulatory Visit: Payer: Medicare Other | Admitting: Neurosurgery

## 2012-07-01 ENCOUNTER — Encounter: Payer: Self-pay | Admitting: Neurosurgery

## 2012-07-02 ENCOUNTER — Other Ambulatory Visit (INDEPENDENT_AMBULATORY_CARE_PROVIDER_SITE_OTHER): Payer: Medicare Other | Admitting: *Deleted

## 2012-07-02 ENCOUNTER — Encounter: Payer: Self-pay | Admitting: Neurosurgery

## 2012-07-02 ENCOUNTER — Ambulatory Visit (INDEPENDENT_AMBULATORY_CARE_PROVIDER_SITE_OTHER): Payer: Medicare Other | Admitting: Neurosurgery

## 2012-07-02 VITALS — BP 193/94 | HR 66 | Resp 20 | Ht 63.0 in | Wt 185.0 lb

## 2012-07-02 DIAGNOSIS — I701 Atherosclerosis of renal artery: Secondary | ICD-10-CM

## 2012-07-02 NOTE — Progress Notes (Signed)
[facility] Office Note  CC: One-year renal artery duplex surveillance Referring Physician: Early  History of Present Illness: 71 year old female patient of Dr. Arbie Cookey followed for left renal artery stenosis. The patient states she has had a history of breast cancer as well as open heart surgery but not vascular intervention and reports no unusual abdominal or back pain. The patient's blood pressure is somewhat elevated today however she states it has been under control with a Verapramil and that dose has not been changed recently and remains at 240 mg a day.  Past Medical History  Diagnosis Date  . Left renal artery stenosis     a. Moderate L RAS  . Hyperlipidemia   . Hypertension   . Cancer     Left breast  . COPD (chronic obstructive pulmonary disease)   . Arthritis   . Atrial fibrillation   . Coronary artery disease     a. 11/2009 CABGx3: LIMA->LAD, VG->RCA, VG->OM2.  . Chronic diastolic CHF (congestive heart failure)     a. 08/2010 Echo: EF 65-70%, Gr 1 DD, Mild MR    ROS: [x]  Positive   [ ]  Denies    General: [ ]  Weight loss, [ ]  Fever, [ ]  chills Neurologic: [ ]  Dizziness, [ ]  Blackouts, [ ]  Seizure [ ]  Stroke, [ ]  "Mini stroke", [ ]  Slurred speech, [ ]  Temporary blindness; [ ]  weakness in arms or legs, [ ]  Hoarseness Cardiac: [ ]  Chest pain/pressure, [ ]  Shortness of breath at rest [ ]  Shortness of breath with exertion, [ ]  Atrial fibrillation or irregular heartbeat Vascular: [ ]  Pain in legs with walking, [ ]  Pain in legs at rest, [ ]  Pain in legs at night,  [ ]  Non-healing ulcer, [ ]  Blood clot in vein/DVT,   Pulmonary: [ ]  Home oxygen, [ ]  Productive cough, [ ]  Coughing up blood, [ ]  Asthma,  [ ]  Wheezing Musculoskeletal:  [ ]  Arthritis, [ ]  Low back pain, [ ]  Joint pain Hematologic: [ ]  Easy Bruising, [ ]  Anemia; [ ]  Hepatitis Gastrointestinal: [ ]  Blood in stool, [ ]  Gastroesophageal Reflux/heartburn, [ ]  Trouble  swallowing Urinary: [ ]  chronic Kidney disease, [ ]  on HD - [ ]  MWF or [ ]  TTHS, [ ]  Burning with urination, [ ]  Difficulty urinating Skin: [ ]  Rashes, [ ]  Wounds Psychological: [ ]  Anxiety, [ ]  Depression   Social History History  Substance Use Topics  . Smoking status: Former Smoker -- 1.5 packs/day for 50 years    Types: Cigarettes    Quit date: 01/01/2010  . Smokeless tobacco: Never Used  . Alcohol Use: No    Family History Family History  Problem Relation Age of Onset  . Heart failure Mother   . Heart disease Mother   . COPD Father   . Heart failure Grandchild   . Heart attack Brother   . Heart disease Brother     Allergies  Allergen Reactions  . Iohexol      Code: SOB, Desc: ct @ ser w/ throat swelling & sob, ok w/ 13 hr prep//a.c., Onset Date: [id number]     Current Outpatient Prescriptions  Medication Sig Dispense Refill  . allopurinol (ZYLOPRIM) 100 MG tablet Take 1 tablet by mouth Daily.      Marland Kitchen ALPRAZolam (XANAX) 0.25 MG tablet Once daily as needed.      Marland Kitchen amoxicillin (AMOXIL) 500 MG capsule as directed.      Marland Kitchen amoxicillin-clavulanate (AUGMENTIN)  875-125 MG per tablet Take 1 tablet by mouth 2 (two) times daily.      Marland Kitchen aspirin 81 MG chewable tablet Chew 81 mg by mouth daily.      Marland Kitchen dicyclomine (BENTYL) 10 MG capsule Take 20 mg by mouth.       . furosemide (LASIX) 20 MG tablet Take 1 tablet (20 mg total) by mouth daily.  90 tablet  3  . HYDROcodone-acetaminophen (LORTAB) 7.5-500 MG per tablet as needed.      . metoprolol tartrate (LOPRESSOR) 25 MG tablet Take 25 mg by mouth 2 (two) times daily.       Marland Kitchen PROCTOSOL HC 2.5 % rectal cream as directed.      . rosuvastatin (CRESTOR) 40 MG tablet Take 1 tablet (40 mg total) by mouth daily.  30 tablet  5  . verapamil (COVERA HS) 240 MG (CO) 24 hr tablet Take 240 mg by mouth at bedtime.        Marland Kitchen zolpidem (AMBIEN) 10 MG tablet as needed.        Physical Examination  Filed Vitals:   07/02/12 1009  BP: 193/94  Pulse:  66  Resp: 20    Body mass index is 32.77 kg/(m^2).  General:  WDWN in NAD Gait: Normal HEENT: WNL Eyes: Pupils equal Pulmonary: normal non-labored breathing , without Rales, rhonchi,  wheezing Cardiac: RRR, without  Murmurs, rubs or gallops; No carotid bruits Abdomen: soft, NT, no masses Skin: no rashes, ulcers noted Vascular Exam/Pulses: Palpable femoral and lower extremity pulses  Extremities without ischemic changes, no Gangrene , no cellulitis; no open wounds;  Musculoskeletal: no muscle wasting or atrophy  Neurologic: A&O X 3; Appropriate Affect ; SENSATION: normal; MOTOR FUNCTION:  moving all extremities equally. Speech is fluent/normal  Non-Invasive Vascular Imaging: Renal artery duplex today shows a velocity and renal aortic ratio suggesting greater than 60 percent stenosis of the right renal artery which is a new finding however waveforms distally are within normal limits is also noted elevation in velocity may be due to the Doppler angle at the ostium. No evidence of significant left renal artery stenosis but there is turbulent flow  ASSESSMENT/PLAN: Asymptomatic patient monitored for renal artery stenosis. I reviewed the above findings with Dr. Imogene Burn who recommends the patient repeat the study in 3 months and see Dr. Arbie Cookey. The patient is in agreement with this plan, her questions were encouraged and answered.  Lauree Chandler ANP  Clinic M.D.: Imogene Burn

## 2012-07-02 NOTE — Addendum Note (Signed)
Addended by: Sharee Pimple on: 07/02/2012 02:04 PM   Modules accepted: Orders

## 2012-07-08 ENCOUNTER — Ambulatory Visit: Payer: Medicare Other | Admitting: Cardiovascular Disease

## 2012-08-06 ENCOUNTER — Ambulatory Visit (INDEPENDENT_AMBULATORY_CARE_PROVIDER_SITE_OTHER): Payer: Medicare Other | Admitting: Cardiovascular Disease

## 2012-08-06 ENCOUNTER — Encounter: Payer: Self-pay | Admitting: Cardiovascular Disease

## 2012-08-06 VITALS — BP 138/85 | HR 74 | Ht 63.0 in | Wt 186.0 lb

## 2012-08-06 DIAGNOSIS — I25810 Atherosclerosis of coronary artery bypass graft(s) without angina pectoris: Secondary | ICD-10-CM

## 2012-08-06 DIAGNOSIS — J449 Chronic obstructive pulmonary disease, unspecified: Secondary | ICD-10-CM

## 2012-08-06 MED ORDER — ALBUTEROL SULFATE HFA 108 (90 BASE) MCG/ACT IN AERS: 2.0000 | INHALATION_SPRAY | Freq: Four times a day (QID) | RESPIRATORY_TRACT | Status: DC | PRN

## 2012-08-06 NOTE — Progress Notes (Signed)
History of Present Illness: 71 yo WF with history of CAD s/p CABG in 2011, renal artery stenosis, HTN, HLD, COPD, former tobacco abuse, breast cancer s/p lumpectomy 1997, post-op atrial fibrillation and anemia here today for cardiac follow up. She has been followed in the past by Dr Joanne [name]. I met her inHer coronary artery bypass grafting was in February 2011. She had a left internal mammary artery to the LAD, vein graft to OM2, and a vein graft to the RCA. She has known renal artery stenosis followed in [facility]. She had left breast cancer treated in 1999 with lumpectomy. She a cholecystectomy in 2001 and hysterectomy 1993. She had a CT scan of the abdomen in March of 2011 which was normal. She had a CT scan of the chest at that time which showed a stable 3 mm lung nodule. Echo November 2011 with normal LV function with LVEF of 70%, severe LVH, thickened MV. Dr Early follows her renal artery stenosis. She is known to have diastolic dysfunction and had some LE edema, was started on Lasix.   She tells me today that she has been doing well since her Lasix was started. LE swelling is much better.  She denies any chest pain. She has baseline SOB with COPD and her breathing has been slightly worse over last few days. She is out of her rescue inhaler. No dizziness, near syncope or syncope.   Primary Care Physician: Jarome Matin  Last Lipid Profile: Followed in primary care.   Past Medical History  Diagnosis Date  . Left renal artery stenosis     a. Moderate L RAS  . Hyperlipidemia   . Hypertension   . Cancer     Left breast  . COPD (chronic obstructive pulmonary disease)   . Arthritis   . Atrial fibrillation   . Coronary artery disease     a. 11/2009 CABGx3: LIMA->LAD, VG->RCA, VG->OM2.  . Chronic diastolic CHF (congestive heart failure)     a. 08/2010 Echo: EF 65-70%, Gr 1 DD, Mild MR    Past Surgical History  Procedure Date  . Breast surgery     left lumpectomy for Cancer  . Coronary artery  bypass graft 12/21/2009    Cabg X3  Dr. Donata Clay  . Tubal ligation   . Cholecystectomy     Laparoscopic  . Tonsillectomy     Current Outpatient Prescriptions  Medication Sig Dispense Refill  . allopurinol (ZYLOPRIM) 100 MG tablet Take 1 tablet by mouth Daily.      Marland Kitchen ALPRAZolam (XANAX) 0.25 MG tablet Once daily as needed.      Marland Kitchen aspirin 81 MG chewable tablet Chew 81 mg by mouth daily.      Marland Kitchen dicyclomine (BENTYL) 10 MG capsule Take 20 mg by mouth.       . furosemide (LASIX) 20 MG tablet Take 1 tablet (20 mg total) by mouth daily.  90 tablet  3  . HYDROcodone-acetaminophen (LORTAB) 7.5-500 MG per tablet as needed.      . metoprolol tartrate (LOPRESSOR) 25 MG tablet Take 25 mg by mouth 2 (two) times daily.       Marland Kitchen PROCTOSOL HC 2.5 % rectal cream as directed.      . rosuvastatin (CRESTOR) 40 MG tablet Take 1 tablet (40 mg total) by mouth daily.  30 tablet  5  . verapamil (COVERA HS) 240 MG (CO) 24 hr tablet Take 240 mg by mouth at bedtime.        Marland Kitchen zolpidem (  AMBIEN) 10 MG tablet as needed.        Allergies  Allergen Reactions  . Iohexol      Code: SOB, Desc: ct @ ser w/ throat swelling & sob, ok w/ 13 hr prep//a.c., Onset Date: [id number]     History   Social History  . Marital Status: Married    Spouse Name: N/A    Number of Children: 6  . Years of Education: N/A   Occupational History  . Retired-[employer]    Social History Main Topics  . Smoking status: Former Smoker -- 1.5 packs/day for 50 years    Types: Cigarettes    Quit date: 01/01/2010  . Smokeless tobacco: Never Used  . Alcohol Use: No  . Drug Use: No  . Sexually Active: Not on file   Other Topics Concern  . Not on file   Social History Narrative  . No narrative on file    Family History  Problem Relation Age of Onset  . Heart failure Mother   . Heart disease Mother   . COPD Father   . Heart failure Grandchild   . Heart attack Brother   . Heart disease Brother     Review of Systems:  As stated in  the HPI and otherwise negative.   BP 140/90  Pulse 74  Ht 5\' 3"  (1.6 m)  Wt 186 lb (84.369 kg)  BMI 32.95 kg/m2  Physical Examination: General: Well developed, well nourished, NAD HEENT: OP clear, mucus membranes moist SKIN: warm, dry. No rashes. Neuro: No focal deficits Musculoskeletal: Muscle strength 5/5 all ext Psychiatric: Mood and affect normal Neck: No JVD, no carotid bruits, no thyromegaly, no lymphadenopathy. Lungs:bilateral wheezes, poor air movement, No  rhonci, crackles Cardiovascular: Regular rate and rhythm. No murmurs, gallops or rubs. Abdomen:Soft. Bowel sounds present. Non-tender.  Extremities: No lower extremity edema. Pulses are 2 + in the bilateral DP/PT.  Echo 05/27/12:  Left ventricle: The cavity size was normal. There was mild focal basal hypertrophy of the septum. Systolic function was normal. The estimated ejection fraction was in the range of 60% to 65%. Wall motion was normal; there were no regional wall motion abnormalities. Doppler parameters are consistent with abnormal left ventricular relaxation (grade 1 diastolic dysfunction). - Aortic valve: Trivial regurgitation. - Mitral valve: Calcified annulus. Mild regurgitation. - Left atrium: The atrium was moderately dilated. - Right atrium: The atrium was mildly dilated. - Tricuspid valve: Mild-moderate regurgitation.     Assessment and Plan:   1. CAD: Stable. No changes.   2. HYPERTENSION: BP well controlled. No changes.   3. Renal artery stenosis: Followed in [facility] by Dr. Arbie Cookey. Due for f/u in December.   4. Hyperlipidemia: She is on a statin. Lipids are followed in primary care.   5. Paroxysmal atrial fibrillation:  Maintaining sinus.   6. COPD: Will refill her rescue inhaler. She needs to go see Pulmonary. She will call

## 2012-08-06 NOTE — Patient Instructions (Addendum)
Your physician wants you to follow-up in:  6 months. You will receive a reminder letter in the mail two months in advance. If you don't receive a letter, please call our office to schedule the follow-up appointment.  Your physician has recommended you make the following change in your medication:  Start Proventil inhaler 2 puffs every 6 hours as needed.  Try over the counter Prilosec for reflux.

## 2012-08-27 ENCOUNTER — Ambulatory Visit (INDEPENDENT_AMBULATORY_CARE_PROVIDER_SITE_OTHER): Payer: Medicare Other | Admitting: Emergency Medicine

## 2012-08-27 ENCOUNTER — Encounter: Payer: Self-pay | Admitting: Emergency Medicine

## 2012-08-27 VITALS — BP 150/80 | HR 71 | Temp 98.0°F | Ht 63.0 in | Wt 190.6 lb

## 2012-08-27 DIAGNOSIS — I5032 Chronic diastolic (congestive) heart failure: Secondary | ICD-10-CM

## 2012-08-27 DIAGNOSIS — Z23 Encounter for immunization: Secondary | ICD-10-CM

## 2012-08-27 DIAGNOSIS — I509 Heart failure, unspecified: Secondary | ICD-10-CM

## 2012-08-27 DIAGNOSIS — J449 Chronic obstructive pulmonary disease, unspecified: Secondary | ICD-10-CM

## 2012-08-27 NOTE — Progress Notes (Signed)
History of Present Illness:  71 yo Joanne [name] with heavy tobacco use, allergies, HTN, divertulitis, breast CA s/p L lumpectomy in 1998.   November Joanne, 2011 -Presents for an acute office visit. Complains of over last 2 weeks her breathing has gotten worse. She gets dyspneic with walking incline, wears out easily. Says she has been very swollen, esp in legs. "feel puffed up everywhere" . She was seen by PCP 1 week ago tx w/ zpack and depo medrol shot. with no improvement. Says she had a dry cough but this has resolved. Does have orthopnea. She was seen at [facility] this am, lasix was increased 40mg  two times a day, labs w/ bnp is pending. She has been set up for PFT and echo later this week. She is been referred for possible renal artery stenting. She was started on Lisinopril this am for elevated b/p. She quit smoking in 11/2009 after her CABG. We have not seen her in >1 year. Says she takes her symbicort and spiriva everyday. Computer review shows CT scan done in 11/2009 showed stable lung nodules over 2 yr span, so considered benign in nature. Says she had cxr done at PCP last week w/ no PNA on it. Denies chest pain, hemoptysis, fever, n/v/d, edema, headache, discolored mucus.   ROV 10/09/10 -- 71 yo former smoker, hx CAD, underwent CABG since I last saw her. Seen by TP as above, has had echocardiogram performed that showed LVH and grade 1 diastolic dysfxn, estimated PASP . Lasix increased in November in response to increased dyspnea. Still "retaining some fluid". Her breathing has been slightly better today, but overall her breathing, fatigue, edema, have been worse since her CABG. No cough, she does have some wheeze at night and some orthopnea. Also some wheeze with exertion. On Symbicort + Spiriva, ProAir about 2x a day. Pulm nodule was stable > 2 yrs by last CT scan. She reports that she has been noted to be anemic, has B12 deficiency, is under eval by Dr Dulce Sellar for possible CSY. They want risk eval before  proceeding.  ROV 08/27/12 -- 71 yo Joanne [name] with COPD, CAD/CABG, HTN, A fib, breast CA. Last seen ~ 2 yrs ago. Followed also by Dr Dicie Beam. She has been having increased dyspnea, decreased functional capacity for about 1 month. She has been on Spiriva + Symbicort. Uses ProAir prn. She is not clear that albuterol helps her. Dr Dicie Beam just restarted her lasix 10/11 (it had been held about 2 months ago due to renal insuff).  Her edema is slowly improving. Her breathing hasn't seemed to get better on the lasix.  She has gained about 60 lbs in 2 yrs.   Past Medical History  Diagnosis Date  . Left renal artery stenosis     a. Moderate L RAS  . Hyperlipidemia   . Hypertension   . Cancer     Left breast  . COPD (chronic obstructive pulmonary disease)   . Arthritis   . Atrial fibrillation   . Coronary artery disease     a. 11/2009 CABGx3: LIMA->LAD, VG->RCA, VG->OM2.  . Chronic diastolic CHF (congestive heart failure)     a. 08/2010 Echo: EF 65-70%, Gr 1 DD, Mild MR     Family History  Problem Relation Age of Onset  . Heart failure Mother   . Heart disease Mother   . COPD Father   . Heart failure Grandchild   . Heart attack Brother   . Heart disease Brother  History   Social History  . Marital Status: Married    Spouse Name: N/A    Number of Children: 6  . Years of Education: N/A   Occupational History  . Retired-[employer]    Social History Main Topics  . Smoking status: Former Smoker -- 1.5 packs/day for 50 years    Types: Cigarettes    Quit date: 01/01/2010  . Smokeless tobacco: Never Used  . Alcohol Use: No  . Drug Use: No  . Sexually Active: Not on file   Other Topics Concern  . Not on file   Social History Narrative  . No narrative on file     Allergies  Allergen Reactions  . Iohexol      Code: SOB, Desc: ct @ ser w/ throat swelling & sob, ok w/ 13 hr prep//a.c., Onset Date: 16109604      Outpatient Prescriptions Prior to Visit  Medication Sig Dispense  Refill  . albuterol (PROVENTIL HFA;VENTOLIN HFA) 108 (90 BASE) MCG/ACT inhaler Inhale 2 puffs into the lungs every 6 (six) hours as needed for wheezing.  1 Inhaler  1  . allopurinol (ZYLOPRIM) 100 MG tablet Take 1 tablet by mouth Daily.      Marland Kitchen ALPRAZolam (XANAX) 0.25 MG tablet Once daily as needed.      Marland Kitchen aspirin 81 MG chewable tablet Chew 81 mg by mouth daily.      Marland Kitchen dicyclomine (BENTYL) 10 MG capsule Take 20 mg by mouth.       . furosemide (LASIX) 20 MG tablet Take 1 tablet (20 mg total) by mouth daily.  90 tablet  3  . HYDROcodone-acetaminophen (LORTAB) 7.5-500 MG per tablet as needed.      . metoprolol tartrate (LOPRESSOR) 25 MG tablet Take 25 mg by mouth 2 (two) times daily.       Marland Kitchen PROCTOSOL HC 2.5 % rectal cream as directed.      . rosuvastatin (CRESTOR) 40 MG tablet Take 1 tablet (40 mg total) by mouth daily.  30 tablet  5  . verapamil (COVERA HS) 240 MG (CO) 24 hr tablet Take 240 mg by mouth at bedtime.        Marland Kitchen zolpidem (AMBIEN) 10 MG tablet as needed.        Filed Vitals:   08/27/12 1432  BP: 150/80  Pulse: 71  Temp: 98 F (36.7 C)   Gen: Pleasant, obese, in no distress,  normal affect  ENT: No lesions,  mouth clear,  oropharynx clear, no postnasal drip  Neck: No JVD, no TMG, no carotid bruits  Lungs: No use of accessory muscles, no wheeze  Cardiovascular: RRR, heart sounds normal, no murmur or gallops, trace peripheral edema  Musculoskeletal: No deformities, no cyanosis or clubbing  Neuro: alert, non focal  Skin: Warm, no lesions or rashes  COPD Interval worsening in her SOB. Possibly related to her wt gain (suspect) or to her volume status (doubt). She is on max BD therapy.  - continue spiriva + symbicort - walking oximetry today - discussed pulm rehab >> she is going to consider starting silver sneakers.   Chronic diastolic CHF (congestive heart failure) - diuretics as directed by Dr Dicie Beam

## 2012-08-27 NOTE — Patient Instructions (Addendum)
Continue your Spiriva and Symbicort Use albuterol as needed for shortness of breath Continue your lasix and other medications as directed by Dr Dicie Beam Walking oximetry today shows hat you do not need to wear oxygen with exertion Get back to your exercise regimen at [employer]. This will help your breathing significantly Follow with Dr Delton Coombes in 3 months or sooner if you have any problems.

## 2012-08-27 NOTE — Assessment & Plan Note (Signed)
-   diuretics as directed by Dr Dicie Beam

## 2012-08-27 NOTE — Assessment & Plan Note (Signed)
Interval worsening in her SOB. Possibly related to her wt gain (suspect) or to her volume status (doubt). She is on max BD therapy.  - continue spiriva + symbicort - walking oximetry today - discussed pulm rehab >> she is going to consider starting silver sneakers.

## 2012-10-04 ENCOUNTER — Encounter: Payer: Self-pay | Admitting: Vascular Surgery

## 2012-10-05 ENCOUNTER — Ambulatory Visit (INDEPENDENT_AMBULATORY_CARE_PROVIDER_SITE_OTHER): Payer: Medicare Other | Admitting: Vascular Surgery

## 2012-10-05 ENCOUNTER — Encounter: Payer: Self-pay | Admitting: Vascular Surgery

## 2012-10-05 ENCOUNTER — Other Ambulatory Visit (INDEPENDENT_AMBULATORY_CARE_PROVIDER_SITE_OTHER): Payer: Medicare Other | Admitting: *Deleted

## 2012-10-05 VITALS — BP 147/87 | HR 62 | Resp 18 | Ht 63.0 in | Wt 192.0 lb

## 2012-10-05 DIAGNOSIS — I722 Aneurysm of renal artery: Secondary | ICD-10-CM

## 2012-10-05 DIAGNOSIS — I701 Atherosclerosis of renal artery: Secondary | ICD-10-CM

## 2012-10-05 NOTE — Progress Notes (Signed)
The patient has today for a followup of the renal artery artery stenosis. This was documented on cardiac catheterization February 2011 with a 50% left renal artery stenosis. She's been followed in the office with duplex which is shown no progression. 3 months ago she had a duplex which suggested right greater than 60% stenosis and no stenosis on the left. Her ultrasound studies have been variable usually showing less than 50% stenoses bilaterally. The patient does report that she does have some fluctuation in blood pressure. She reports that she has been taken off one of her antihypertensive for concern regarding renal function. Her most recent lab work in the current health leg is a creatinine of 1.4 BUN of 31. She has main complaints of shortness of breath with exertion the 2 her severe COPD. She reports no cardiac difficulty since her coronary bypass grafting February 2011. She has quit smoking but has gained great deal of weight following this.  Past Medical History  Diagnosis Date  . Left renal artery stenosis     a. Moderate L RAS  . Hyperlipidemia   . Hypertension   . Cancer     Left breast  . COPD (chronic obstructive pulmonary disease)   . Arthritis   . Atrial fibrillation   . Coronary artery disease     a. 11/2009 CABGx3: LIMA->LAD, VG->RCA, VG->OM2.  . Chronic diastolic CHF (congestive heart failure)     a. 08/2010 Echo: EF 65-70%, Gr 1 DD, Mild MR    History  Substance Use Topics  . Smoking status: Former Smoker -- 1.5 packs/day for 50 years    Types: Cigarettes    Quit date: 01/01/2010  . Smokeless tobacco: Never Used  . Alcohol Use: No    Family History  Problem Relation Age of Onset  . Heart failure Mother   . Heart disease Mother   . COPD Father   . Heart failure Grandchild   . Heart attack Brother   . Heart disease Brother     Allergies  Allergen Reactions  . Iohexol      Code: SOB, Desc: ct @ ser w/ throat swelling & sob, ok w/ 13 hr prep//a.c., Onset Date:  16109604     Current outpatient prescriptions:albuterol (PROVENTIL HFA;VENTOLIN HFA) 108 (90 BASE) MCG/ACT inhaler, Inhale 2 puffs into the lungs every 6 (six) hours as needed for wheezing., Disp: 1 Inhaler, Rfl: 1;  allopurinol (ZYLOPRIM) 100 MG tablet, Take 1 tablet by mouth Daily., Disp: , Rfl: ;  ALPRAZolam (XANAX) 0.25 MG tablet, Once daily as needed., Disp: , Rfl: ;  aspirin 81 MG chewable tablet, Chew 81 mg by mouth daily., Disp: , Rfl:  dicyclomine (BENTYL) 10 MG capsule, Take 20 mg by mouth. , Disp: , Rfl: ;  furosemide (LASIX) 20 MG tablet, Take 1 tablet (20 mg total) by mouth daily., Disp: 90 tablet, Rfl: 3;  HYDROcodone-acetaminophen (LORTAB) 7.5-500 MG per tablet, as needed., Disp: , Rfl: ;  metoprolol tartrate (LOPRESSOR) 25 MG tablet, Take 25 mg by mouth 2 (two) times daily. , Disp: , Rfl: ;  PROCTOSOL HC 2.5 % rectal cream, as directed., Disp: , Rfl:  rosuvastatin (CRESTOR) 40 MG tablet, Take 1 tablet (40 mg total) by mouth daily., Disp: 30 tablet, Rfl: 5;  verapamil (COVERA HS) 240 MG (CO) 24 hr tablet, Take 240 mg by mouth at bedtime.  , Disp: , Rfl: ;  zolpidem (AMBIEN) 10 MG tablet, as needed., Disp: , Rfl:   BP 147/87  Pulse 62  Resp 18  Ht 5\' 3"  (1.6 m)  Wt 192 lb (87.091 kg)  BMI 34.01 kg/m2  Body mass index is 34.01 kg/(m^2).       Physical exam well-developed white female no acute distress Carotid arteries without bruits bilaterally Chest clear bilaterally without wheezes Heart regular rate and rhythm Pulse status: 2+ radial and 2+ dorsalis pedis pulses bilaterally Abdominal exam reveals no masses and no bruits.  Artery duplex today shows no evidence of hemodynamic stenosis in her renal arteries bilaterally. There was limited visualization due to body.  I did explain we had bearing results of her renal artery but feel that she does not have any critical stenosis. I would recommend a 6 month followup renal duplex and would not pursue this and that she had  worsening renal function or severe hypertension. She understands will see [place] for followup in 6 months

## 2012-10-06 NOTE — Addendum Note (Signed)
Addended by: Sharee Pimple on: 10/06/2012 02:50 PM   Modules accepted: Orders

## 2012-11-11 ENCOUNTER — Other Ambulatory Visit: Payer: Self-pay | Admitting: Internal Medicine

## 2012-11-11 DIAGNOSIS — Z9889 Other specified postprocedural states: Secondary | ICD-10-CM

## 2012-11-11 DIAGNOSIS — Z853 Personal history of malignant neoplasm of breast: Secondary | ICD-10-CM

## 2012-11-11 DIAGNOSIS — Z1231 Encounter for screening mammogram for malignant neoplasm of breast: Secondary | ICD-10-CM

## 2012-11-29 ENCOUNTER — Ambulatory Visit: Payer: Medicare Other | Admitting: Emergency Medicine

## 2012-12-09 ENCOUNTER — Ambulatory Visit: Payer: Medicare Other

## 2012-12-20 ENCOUNTER — Ambulatory Visit: Payer: Medicare Other | Admitting: Emergency Medicine

## 2012-12-31 ENCOUNTER — Ambulatory Visit: Payer: Medicare Other

## 2013-01-26 ENCOUNTER — Ambulatory Visit
Admission: RE | Admit: 2013-01-26 | Discharge: 2013-01-26 | Disposition: A | Discharge status: Home / Self Care | Payer: Medicare Other | Source: Ambulatory Visit | Attending: Internal Medicine | Admitting: Internal Medicine

## 2013-01-26 DIAGNOSIS — Z1231 Encounter for screening mammogram for malignant neoplasm of breast: Secondary | ICD-10-CM

## 2013-01-26 DIAGNOSIS — Z9889 Other specified postprocedural states: Secondary | ICD-10-CM

## 2013-01-26 DIAGNOSIS — Z853 Personal history of malignant neoplasm of breast: Secondary | ICD-10-CM

## 2013-01-31 ENCOUNTER — Encounter: Payer: Self-pay | Admitting: Cardiovascular Disease

## 2013-03-07 ENCOUNTER — Ambulatory Visit: Payer: Self-pay | Admitting: Cardiovascular Disease

## 2013-03-25 ENCOUNTER — Ambulatory Visit: Payer: Self-pay | Admitting: Cardiovascular Disease

## 2013-04-05 ENCOUNTER — Other Ambulatory Visit: Payer: Medicare Other

## 2013-04-05 ENCOUNTER — Ambulatory Visit: Payer: Medicare Other | Admitting: Neurosurgery

## 2013-05-25 ENCOUNTER — Ambulatory Visit (INDEPENDENT_AMBULATORY_CARE_PROVIDER_SITE_OTHER): Payer: Medicare Other | Admitting: Cardiovascular Disease

## 2013-05-25 ENCOUNTER — Encounter: Payer: Self-pay | Admitting: Cardiovascular Disease

## 2013-05-25 VITALS — BP 122/72 | HR 69 | Ht 63.0 in | Wt 186.0 lb

## 2013-05-25 DIAGNOSIS — Z01810 Encounter for preprocedural cardiovascular examination: Secondary | ICD-10-CM

## 2013-05-25 DIAGNOSIS — I2510 Atherosclerotic heart disease of native coronary artery without angina pectoris: Secondary | ICD-10-CM

## 2013-05-25 LAB — LIPID PANEL
Cholesterol: 196 mg/dL (ref 0–200)
HDL: 48.3 mg/dL (ref >39.00)

## 2013-05-25 LAB — HEPATIC FUNCTION PANEL
Bilirubin, Direct: 0 mg/dL (ref 0.0–0.3)
Total Bilirubin: 0.5 mg/dL (ref 0.3–1.2)
Total Protein: 7.5 g/dL (ref 6.0–8.3)

## 2013-05-25 LAB — LDL CHOLESTEROL, DIRECT: Direct LDL: 124.3 mg/dL

## 2013-05-25 NOTE — Patient Instructions (Addendum)
Your physician wants you to follow-up in:  6 months. You will receive a reminder letter in the mail two months in advance. If you don't receive a letter, please call our office to schedule the follow-up appointment.   

## 2013-05-25 NOTE — Progress Notes (Signed)
History of Present Illness: 72 yo WF with history of CAD s/p CABG in 2011, renal artery stenosis, HTN, HLD, COPD, former tobacco abuse, breast cancer s/p lumpectomy 1997, post-op atrial fibrillation and anemia here today for cardiac follow up. She has been followed in the past by Dr Deborah Chalk. Her coronary artery bypass grafting was in February 2011. She had a left internal mammary artery to the LAD, vein graft to OM2, and a vein graft to the RCA. She had left breast cancer treated in 1999 with lumpectomy. She a cholecystectomy in 2001 and hysterectomy 1993. She had a CT scan of the abdomen in March of 2011 which was normal. She had a CT scan of the chest at that time which showed a stable 3 mm lung nodule. Echo November 2011 with normal LV function with LVEF of 70%, severe LVH, thickened MV. Dr Early follows her renal artery stenosis. She is known to have diastolic dysfunction and had some LE edema, was started on Lasix which improvement in lower extremity edema.   She is here today for follow up. She denies any chest pain. She has baseline SOB with COPD. Three weeks ago she had some SOB and BNP in primary care normal per pt. She has plans for back surgery with Dr. Yetta Barre soon.   Primary Care Physician: Jarome Matin   Last Lipid Profile: Followed in primary care.   Past Medical History  Diagnosis Date  . Left renal artery stenosis     a. Moderate L RAS  . Hyperlipidemia   . Hypertension   . Cancer     Left breast  . COPD (chronic obstructive pulmonary disease)   . Arthritis   . Atrial fibrillation   . Coronary artery disease     a. 11/2009 CABGx3: LIMA->LAD, VG->RCA, VG->OM2.  . Chronic diastolic CHF (congestive heart failure)     a. 08/2010 Echo: EF 65-70%, Gr 1 DD, Mild MR    Past Surgical History  Procedure Laterality Date  . Breast surgery      left lumpectomy for Cancer  . Coronary artery bypass graft  12/21/2009    Cabg X3  Dr. Donata Clay  . Tubal ligation    . Cholecystectomy       Laparoscopic  . Tonsillectomy      Current Outpatient Prescriptions  Medication Sig Dispense Refill  . albuterol (PROVENTIL HFA;VENTOLIN HFA) 108 (90 BASE) MCG/ACT inhaler Inhale 2 puffs into the lungs every 6 (six) hours as needed for wheezing.  1 Inhaler  1  . allopurinol (ZYLOPRIM) 100 MG tablet Take 1 tablet by mouth Daily.      Marland Kitchen ALPRAZolam (XANAX) 0.25 MG tablet Once daily as needed.      Marland Kitchen aspirin 81 MG chewable tablet Chew 81 mg by mouth daily.      Marland Kitchen dicyclomine (BENTYL) 10 MG capsule Take 20 mg by mouth.       Marland Kitchen HYDROcodone-acetaminophen (LORTAB) 7.5-500 MG per tablet as needed.      Marland Kitchen ipratropium (ATROVENT) 0.02 % nebulizer solution       . metoprolol tartrate (LOPRESSOR) 25 MG tablet Take 25 mg by mouth 2 (two) times daily.       Marland Kitchen PROCTOSOL HC 2.5 % rectal cream as directed.      . rosuvastatin (CRESTOR) 40 MG tablet Take 1 tablet (40 mg total) by mouth daily.  30 tablet  5  . verapamil (COVERA HS) 240 MG (CO) 24 hr tablet Take 240 mg by mouth at  bedtime.        Marland Kitchen zolpidem (AMBIEN) 10 MG tablet as needed.       No current facility-administered medications for this visit.    Allergies  Allergen Reactions  . Iohexol      Code: SOB, Desc: ct @ ser w/ throat swelling & sob, ok w/ 13 hr prep//a.c., Onset Date: [id number]     History   Social History  . Marital Status: Married    Spouse Name: N/A    Number of Children: 6  . Years of Education: N/A   Occupational History  . Retired-[employer]    Social History Main Topics  . Smoking status: Former Smoker -- 1.50 packs/day for 50 years    Types: Cigarettes    Quit date: 01/01/2010  . Smokeless tobacco: Never Used  . Alcohol Use: No  . Drug Use: No  . Sexually Active: Not on file   Other Topics Concern  . Not on file   Social History Narrative  . No narrative on file    Family History  Problem Relation Age of Onset  . Heart failure Mother   . Heart disease Mother   . COPD Father   . Heart failure  Grandchild   . Heart attack Brother   . Heart disease Brother     Review of Systems:  As stated in the HPI and otherwise negative.   BP 122/72  Pulse 69  Ht 5\' 3"  (1.6 m)  Wt 186 lb (84.369 kg)  BMI 32.96 kg/m2  Physical Examination: General: Well developed, well nourished, NAD HEENT: OP clear, mucus membranes moist SKIN: warm, dry. No rashes. Neuro: No focal deficits Musculoskeletal: Muscle strength 5/5 all ext Psychiatric: Mood and affect normal Neck: No JVD, no carotid bruits, no thyromegaly, no lymphadenopathy. Lungs:Clear bilaterally, no wheezes, rhonci, crackles Cardiovascular: Regular rate and rhythm. No murmurs, gallops or rubs. Abdomen:Soft. Bowel sounds present. Non-tender.  Extremities: No lower extremity edema. Pulses are 2 + in the bilateral DP/PT.  EKG: NSR, rate 69 bpm. RBBB  Echo 05/27/12:  Left ventricle: The cavity size was normal. There was mild focal basal hypertrophy of the septum. Systolic function was normal. The estimated ejection fraction was in the range of 60% to 65%. Wall motion was normal; there were no regional wall motion abnormalities. Doppler parameters are consistent with abnormal left ventricular relaxation (grade 1 diastolic dysfunction). - Aortic valve: Trivial regurgitation. - Mitral valve: Calcified annulus. Mild regurgitation. - Left atrium: The atrium was moderately dilated. - Right atrium: The atrium was mildly dilated. - Tricuspid valve: Mild-moderate regurgitation.   Assessment and Plan:   1. CAD: Stable. No changes.   2. HYPERTENSION: BP well controlled. No changes.   3. Renal artery stenosis: Followed in [facility] by Dr. Arbie Cookey.   4. Hyperlipidemia: She is on a statin. Lipids are followed in primary care.   5. Paroxysmal atrial fibrillation: Maintaining sinus.   6. Pre-operative cardiovascular examination: She has no angina, CHF. No change in her functional level. EKG unchanged today. Would proceed with surgical procedure  without further cardiac evaluation.

## 2013-05-30 ENCOUNTER — Encounter: Payer: Self-pay | Admitting: Vascular Surgery

## 2013-05-31 ENCOUNTER — Other Ambulatory Visit: Payer: Self-pay | Admitting: *Deleted

## 2013-05-31 ENCOUNTER — Encounter: Payer: Self-pay | Admitting: Vascular Surgery

## 2013-05-31 ENCOUNTER — Ambulatory Visit (INDEPENDENT_AMBULATORY_CARE_PROVIDER_SITE_OTHER): Payer: Medicare Other | Admitting: Vascular Surgery

## 2013-05-31 ENCOUNTER — Other Ambulatory Visit (INDEPENDENT_AMBULATORY_CARE_PROVIDER_SITE_OTHER): Payer: Medicare Other

## 2013-05-31 VITALS — BP 141/78 | HR 68 | Resp 16 | Ht 63.0 in | Wt 185.0 lb

## 2013-05-31 DIAGNOSIS — I701 Atherosclerosis of renal artery: Secondary | ICD-10-CM

## 2013-05-31 DIAGNOSIS — R209 Unspecified disturbances of skin sensation: Secondary | ICD-10-CM

## 2013-05-31 DIAGNOSIS — M25561 Pain in right knee: Secondary | ICD-10-CM

## 2013-05-31 DIAGNOSIS — M25569 Pain in unspecified knee: Secondary | ICD-10-CM

## 2013-05-31 NOTE — Progress Notes (Signed)
Patient has today for followup of renal artery stenosis. This was diagnosed time of cardiac catheterization in 2011 with a 50% left renal artery stenosis. She's been followed in our nonevasive vascular lab since that time with very varying results. 6 months ago she had somewhat less than 60% predicted stenosis in her left renal artery. She is here today for repeat study. She has planning to have disc surgery with Dr. Yetta Barre. He does have pain in her low back and pain more so in her right leg than her left leg. She reports this is positional. She has difficulty walking for a great distance to to leg discomfort. He has no history of lower surety arterial insufficiency and no history of nonhealing wounds in her lower extremity  Past Medical History  Diagnosis Date  . Left renal artery stenosis     a. Moderate L RAS  . Hyperlipidemia   . Hypertension   . Cancer     Left breast  . COPD (chronic obstructive pulmonary disease)   . Arthritis   . Atrial fibrillation   . Coronary artery disease     a. 11/2009 CABGx3: LIMA->LAD, VG->RCA, VG->OM2.  . Chronic diastolic CHF (congestive heart failure)     a. 08/2010 Echo: EF 65-70%, Gr 1 DD, Mild MR  . Back pain July 2014  . Scoliosis of thoracic spine     History  Substance Use Topics  . Smoking status: Former Smoker -- 1.50 packs/day for 50 years    Types: Cigarettes    Quit date: 01/01/2010  . Smokeless tobacco: Never Used  . Alcohol Use: No    Family History  Problem Relation Age of Onset  . Heart failure Mother   . Heart disease Mother   . COPD Father   . Heart failure Grandchild   . Heart attack Brother   . Heart disease Brother     Allergies  Allergen Reactions  . Iohexol      Code: SOB, Desc: ct @ ser w/ throat swelling & sob, ok w/ 13 hr prep//a.c., Onset Date: 16109604     Current outpatient prescriptions:albuterol (PROVENTIL HFA;VENTOLIN HFA) 108 (90 BASE) MCG/ACT inhaler, Inhale 2 puffs into the lungs every 6 (six) hours as  needed for wheezing., Disp: 1 Inhaler, Rfl: 1;  allopurinol (ZYLOPRIM) 100 MG tablet, Take 1 tablet by mouth Daily., Disp: , Rfl: ;  ALPRAZolam (XANAX) 0.25 MG tablet, Once daily as needed., Disp: , Rfl: ;  aspirin 81 MG chewable tablet, Chew 81 mg by mouth daily., Disp: , Rfl:  dicyclomine (BENTYL) 10 MG capsule, Take 20 mg by mouth. , Disp: , Rfl: ;  HYDROcodone-acetaminophen (LORTAB) 7.5-500 MG per tablet, as needed., Disp: , Rfl: ;  ipratropium (ATROVENT) 0.02 % nebulizer solution, , Disp: , Rfl: ;  metoprolol tartrate (LOPRESSOR) 25 MG tablet, Take 25 mg by mouth 2 (two) times daily. , Disp: , Rfl: ;  PROCTOSOL HC 2.5 % rectal cream, as directed., Disp: , Rfl:  rosuvastatin (CRESTOR) 40 MG tablet, Take 1 tablet (40 mg total) by mouth daily., Disp: 30 tablet, Rfl: 5;  verapamil (COVERA HS) 240 MG (CO) 24 hr tablet, Take 240 mg by mouth at bedtime.  , Disp: , Rfl: ;  zolpidem (AMBIEN) 10 MG tablet, as needed., Disp: , Rfl:   BP 141/78  Pulse 68  Resp 16  Ht 5\' 3"  (1.6 m)  Wt 185 lb (83.915 kg)  BMI 32.78 kg/m2  SpO2 97%  Body mass index is 32.78 kg/(m^2).  Physical exam: Well-developed well-nourished female in no acute distress Carotid arteries without bruits bilaterally Pulse status: 2+ radial 2+ femoral and 2+ dorsalis pedis pulses bilaterally Abdomen soft nontender no masses noted and no bruits noted Neurologically she is grossly intact  Vascular lab reveals left renal artery stenosis in the lower end of the greater than 60% stenosis with some tortuosity no significant right renal artery stenosis  Impression and plan known moderate left renal artery stenosis with no change. She is not having any extreme hypertension or renal dysfunction. Would continue yearly observation. Regarding her lower surety symptoms. She does not have any evidence of lower extremity arterial insufficiency which which would suggest arterial claudication. Normal lower surety pulses. I do not see any  contraindication to back surgery if required. She will see [place] again in one year

## 2013-10-05 ENCOUNTER — Encounter: Payer: Self-pay | Admitting: Cardiovascular Disease

## 2013-10-12 ENCOUNTER — Other Ambulatory Visit: Payer: Self-pay | Admitting: Cardiovascular Disease

## 2013-11-22 ENCOUNTER — Ambulatory Visit: Payer: Medicare Other | Admitting: Cardiovascular Disease

## 2013-12-02 ENCOUNTER — Ambulatory Visit: Payer: Medicare Other | Admitting: Cardiovascular Disease

## 2013-12-15 ENCOUNTER — Other Ambulatory Visit: Payer: Self-pay | Admitting: Vascular Surgery

## 2013-12-15 DIAGNOSIS — I701 Atherosclerosis of renal artery: Secondary | ICD-10-CM

## 2013-12-16 ENCOUNTER — Ambulatory Visit: Payer: Commercial Managed Care - HMO | Admitting: Cardiovascular Disease

## 2013-12-27 ENCOUNTER — Other Ambulatory Visit: Payer: Self-pay | Admitting: Cardiovascular Disease

## 2013-12-28 ENCOUNTER — Other Ambulatory Visit: Payer: Self-pay

## 2013-12-28 DIAGNOSIS — Z1231 Encounter for screening mammogram for malignant neoplasm of breast: Secondary | ICD-10-CM

## 2014-02-07 ENCOUNTER — Ambulatory Visit (INDEPENDENT_AMBULATORY_CARE_PROVIDER_SITE_OTHER): Payer: Commercial Managed Care - HMO | Admitting: Cardiovascular Disease

## 2014-02-07 ENCOUNTER — Encounter: Payer: Self-pay | Admitting: Cardiovascular Disease

## 2014-02-07 VITALS — BP 166/100 | HR 75 | Ht 63.0 in | Wt 188.0 lb

## 2014-02-07 DIAGNOSIS — E785 Hyperlipidemia, unspecified: Secondary | ICD-10-CM

## 2014-02-07 DIAGNOSIS — I10 Essential (primary) hypertension: Secondary | ICD-10-CM

## 2014-02-07 DIAGNOSIS — R079 Chest pain, unspecified: Secondary | ICD-10-CM

## 2014-02-07 DIAGNOSIS — I5032 Chronic diastolic (congestive) heart failure: Secondary | ICD-10-CM

## 2014-02-07 DIAGNOSIS — I2510 Atherosclerotic heart disease of native coronary artery without angina pectoris: Secondary | ICD-10-CM

## 2014-02-07 DIAGNOSIS — I4891 Unspecified atrial fibrillation: Secondary | ICD-10-CM

## 2014-02-07 DIAGNOSIS — I509 Heart failure, unspecified: Secondary | ICD-10-CM

## 2014-02-07 DIAGNOSIS — R002 Palpitations: Secondary | ICD-10-CM

## 2014-02-07 MED ORDER — METOPROLOL TARTRATE 50 MG PO TABS: 50.0000 mg | ORAL_TABLET | Freq: Two times a day (BID) | ORAL | Status: DC

## 2014-02-07 NOTE — Patient Instructions (Addendum)
Your physician recommends that you schedule a follow-up appointment in:  3-4 weeks.   Your physician has requested that you have a lexiscan myoview. For further information please visit [email or web address]. Please follow instruction sheet, as given.   Your physician has recommended you make the following change in your medication:  Increase Lopressor to 50 mg by mouth twice daily

## 2014-02-07 NOTE — Progress Notes (Signed)
History of Present Illness: 73 yo WF with history of CAD s/p CABG in 2011, RBBB, renal artery stenosis, HTN, HLD, COPD, former tobacco abuse, breast cancer s/p lumpectomy 1997, post-op atrial fibrillation and anemia here today for cardiac follow up. She has been followed in the past by Dr Doreatha Lew. Her coronary artery bypass grafting was in February 2011. She had a left internal mammary artery to the LAD, vein graft to OM2, and a vein graft to the RCA. Echo November 2011 with normal LV function with LVEF of 70%, severe LVH, thickened MV. Dr Early follows her renal artery stenosis. She is known to have diastolic dysfunction and had some LE edema, was started on Lasix which improvement in lower extremity edema.   She is here today for follow up. She reports exertional chest pains, worsened dyspnea. Also noticing her heart race at times. Feels that is going fast but not skipping. She has baseline SOB with COPD.  She feels that her abdomen is bloated. She has no dysphagia.   Primary Care Physician: Leanna Battles   Last Lipid Profile: Followed in primary care.   Past Medical History  Diagnosis Date  . Left renal artery stenosis     a. Moderate L RAS  . Hyperlipidemia   . Hypertension   . Cancer     Left breast  . COPD (chronic obstructive pulmonary disease)   . Arthritis   . Atrial fibrillation   . Coronary artery disease     a. 11/2009 CABGx3: LIMA->LAD, VG->RCA, VG->OM2.  . Chronic diastolic CHF (congestive heart failure)     a. 08/2010 Echo: EF 65-70%, Gr 1 DD, Mild MR  . Back pain July 2014  . Scoliosis of thoracic spine     Past Surgical History  Procedure Laterality Date  . Breast surgery      left lumpectomy for Cancer  . Coronary artery bypass graft  12/21/2009    Cabg X3  Dr. Prescott Gum  . Tubal ligation    . Cholecystectomy      Laparoscopic  . Tonsillectomy      Current Outpatient Prescriptions  Medication Sig Dispense Refill  . albuterol (PROVENTIL HFA;VENTOLIN HFA)  108 (90 BASE) MCG/ACT inhaler Inhale 2 puffs into the lungs every 6 (six) hours as needed for wheezing.  1 Inhaler  1  . allopurinol (ZYLOPRIM) 100 MG tablet Take 1 tablet by mouth Daily.      Marland Kitchen ALPRAZolam (XANAX) 0.25 MG tablet Once daily as needed.      Marland Kitchen aspirin 81 MG chewable tablet Chew 81 mg by mouth daily.      Marland Kitchen dicyclomine (BENTYL) 10 MG capsule Take 20 mg by mouth.       Marland Kitchen HYDROcodone-acetaminophen (LORTAB) 7.5-500 MG per tablet as needed.      Marland Kitchen ipratropium (ATROVENT) 0.02 % nebulizer solution       . metoprolol tartrate (LOPRESSOR) 25 MG tablet Take 25 mg by mouth 2 (two) times daily.       Marland Kitchen PROCTOSOL HC 2.5 % rectal cream as directed.      . rosuvastatin (CRESTOR) 40 MG tablet Take 1 tablet (40 mg total) by mouth daily.  30 tablet  5  . verapamil (COVERA HS) 240 MG (CO) 24 hr tablet Take 240 mg by mouth at bedtime.        Marland Kitchen zolpidem (AMBIEN) 10 MG tablet as needed.       No current facility-administered medications for this visit.    Allergies  Allergen Reactions  . Iohexol      Code: SOB, Desc: ct @ ser w/ throat swelling & sob, ok w/ 13 hr prep//a.c., Onset Date: [id number]     History   Social History  . Marital Status: Married    Spouse Name: N/A    Number of Children: 6  . Years of Education: N/A   Occupational History  . Retired-[employer]    Social History Main Topics  . Smoking status: Former Smoker -- 1.50 packs/day for 50 years    Types: Cigarettes    Quit date: 01/01/2010  . Smokeless tobacco: Never Used  . Alcohol Use: No  . Drug Use: No  . Sexual Activity: Not on file   Other Topics Concern  . Not on file   Social History Narrative  . No narrative on file    Family History  Problem Relation Age of Onset  . Heart failure Mother   . Heart disease Mother   . COPD Father   . Heart failure Grandchild   . Heart attack Brother   . Heart disease Brother     Review of Systems:  As stated in the HPI and otherwise negative.   BP 166/100   Pulse 75  Ht 5\' 3"  (1.6 m)  Wt 188 lb (85.276 kg)  BMI 33.31 kg/m2  Physical Examination: General: Well developed, well nourished, NAD HEENT: OP clear, mucus membranes moist SKIN: warm, dry. No rashes. Neuro: No focal deficits Musculoskeletal: Muscle strength 5/5 all ext Psychiatric: Mood and affect normal Neck: No JVD, no carotid bruits, no thyromegaly, no lymphadenopathy. Lungs:Clear bilaterally, no wheezes, rhonci, crackles Cardiovascular: Regular rate and rhythm. No murmurs, gallops or rubs. Abdomen:Soft. Bowel sounds present. Non-tender.  Extremities: No lower extremity edema. Pulses are 2 + in the bilateral DP/PT.  EKG: NSR, rate 75 bpm. RBBB  Echo 05/27/12:  Left ventricle: The cavity size was normal. There was mild focal basal hypertrophy of the septum. Systolic function was normal. The estimated ejection fraction was in the range of 60% to 65%. Wall motion was normal; there were no regional wall motion abnormalities. Doppler parameters are consistent with abnormal left ventricular relaxation (grade 1 diastolic dysfunction). - Aortic valve: Trivial regurgitation. - Mitral valve: Calcified annulus. Mild regurgitation. - Left atrium: The atrium was moderately dilated. - Right atrium: The atrium was mildly dilated. - Tricuspid valve: Mild-moderate regurgitation.   Assessment and Plan:   1. CAD/chest pain: Recent chest pains. Known to have CAD with prior CABG. Will arrange Lexiscan myoview to exclude ischemia.  Continue current therapy.  2. HYPERTENSION: BP elevated. Will increase Lopressor to 50 mg po BID.   3. Renal artery stenosis: Followed in [facility] by Dr. Donnetta Hutching. Follow up in August 2015.   4. Hyperlipidemia: She is on a statin. Lipids are followed in primary care.   5. Paroxysmal atrial fibrillation: Maintaining sinus. Recent palpitations. Will see if this improves with increased dose of beta blocker. If continues, will need event monitor.

## 2014-02-09 ENCOUNTER — Ambulatory Visit: Payer: Medicare HMO

## 2014-02-16 ENCOUNTER — Ambulatory Visit: Payer: Medicare HMO

## 2014-02-22 ENCOUNTER — Encounter (HOSPITAL_COMMUNITY): Payer: Medicare HMO

## 2014-02-27 ENCOUNTER — Ambulatory Visit: Payer: Medicare HMO | Admitting: Cardiovascular Disease

## 2014-03-16 ENCOUNTER — Ambulatory Visit
Admission: RE | Admit: 2014-03-16 | Discharge: 2014-03-16 | Disposition: A | Discharge status: Home / Self Care | Payer: Commercial Managed Care - HMO | Source: Ambulatory Visit

## 2014-03-16 ENCOUNTER — Other Ambulatory Visit: Payer: Self-pay

## 2014-03-16 DIAGNOSIS — Z1231 Encounter for screening mammogram for malignant neoplasm of breast: Secondary | ICD-10-CM

## 2014-06-13 ENCOUNTER — Other Ambulatory Visit (HOSPITAL_COMMUNITY): Payer: Medicare Other

## 2014-06-13 ENCOUNTER — Ambulatory Visit: Payer: Medicare Other | Admitting: Family

## 2014-06-20 ENCOUNTER — Other Ambulatory Visit: Payer: Self-pay | Admitting: Cardiovascular Disease

## 2014-07-05 ENCOUNTER — Encounter: Payer: Commercial Managed Care - HMO | Admitting: Physician Assistant

## 2014-07-12 ENCOUNTER — Encounter: Payer: Self-pay | Admitting: Family

## 2014-07-13 ENCOUNTER — Ambulatory Visit (INDEPENDENT_AMBULATORY_CARE_PROVIDER_SITE_OTHER): Payer: Commercial Managed Care - HMO | Admitting: Family

## 2014-07-13 ENCOUNTER — Ambulatory Visit (HOSPITAL_COMMUNITY)
Admission: RE | Admit: 2014-07-13 | Discharge: 2014-07-13 | Disposition: A | Discharge status: Home / Self Care | Payer: Medicare HMO | Source: Ambulatory Visit | Attending: Family | Admitting: Family

## 2014-07-13 ENCOUNTER — Encounter: Payer: Self-pay | Admitting: Family

## 2014-07-13 VITALS — BP 154/84 | HR 66 | Resp 16 | Ht 63.0 in | Wt 187.0 lb

## 2014-07-13 DIAGNOSIS — M79605 Pain in left leg: Secondary | ICD-10-CM | POA: Insufficient documentation

## 2014-07-13 DIAGNOSIS — R531 Weakness: Secondary | ICD-10-CM | POA: Insufficient documentation

## 2014-07-13 DIAGNOSIS — M79609 Pain in unspecified limb: Secondary | ICD-10-CM

## 2014-07-13 DIAGNOSIS — J449 Chronic obstructive pulmonary disease, unspecified: Secondary | ICD-10-CM | POA: Diagnosis not present

## 2014-07-13 DIAGNOSIS — I701 Atherosclerosis of renal artery: Secondary | ICD-10-CM

## 2014-07-13 DIAGNOSIS — R5383 Other fatigue: Secondary | ICD-10-CM

## 2014-07-13 DIAGNOSIS — R5381 Other malaise: Secondary | ICD-10-CM

## 2014-07-13 DIAGNOSIS — J4489 Other specified chronic obstructive pulmonary disease: Secondary | ICD-10-CM | POA: Insufficient documentation

## 2014-07-13 NOTE — Progress Notes (Addendum)
Established Renal Artery Stenosis  History of Present Illness  Joanne [name] is a 73 y.o. (04/17/1941) female patient of Dr. Donnetta Hutching who returns today for followup of renal artery stenosis. This was diagnosed at the time of cardiac catheterization in 2011 with a 50% left renal artery stenosis. She's been followed in our nonevasive vascular lab since that time with varying results. She was planning to have lumbar disc surgery with Dr. Ronnald Ramp. She does have pain in her low back and pain more so in her right leg than her left leg. She reports this is positional. She has difficulty walking for a great distance to to leg discomfort. He has no history of lower extremity arterial insufficiency and no history of nonhealing wounds in her lower extremity. Her blood pressure has remained elevated, her cardiologist increased her dose of metoprolol recently, the dose of verapamil remains the same. She has a problem with bloating and gas, as evidences on today's renal artery Duplex.  The patient's urinary history: she feels like she needs to urinate frequently and cannot urinate, and has urinary frequency at night; she states she has an appointment with her PCP tomorrow morning and will discuss this with her PCP; states this has been going on for about 4 months, progressively worse.  She states she is afraid of the nuclear stress test coming up; I advised her to discuss her concerns with her cardiologist office.   She denies any history of stroke or TIA.  Past Medical History  Diagnosis Date  . Left renal artery stenosis     a. Moderate L RAS  . Hyperlipidemia   . Hypertension   . Cancer     Left breast  . COPD (chronic obstructive pulmonary disease)   . Arthritis   . Atrial fibrillation   . Coronary artery disease     a. 11/2009 CABGx3: LIMA->LAD, VG->RCA, VG->OM2.  . Chronic diastolic CHF (congestive heart failure)     a. 08/2010 Echo: EF 65-70%, Gr 1 DD, Mild MR  . Back pain July 2014  . Scoliosis  of thoracic spine     Social History History  Substance Use Topics  . Smoking status: Former Smoker -- 1.50 packs/day for 50 years    Types: Cigarettes    Quit date: 01/01/2010  . Smokeless tobacco: Never Used  . Alcohol Use: No    Family History Family History  Problem Relation Age of Onset  . Heart failure Mother   . Heart disease Mother     Before age 41  . Diabetes Mother   . Hyperlipidemia Mother   . Hypertension Mother   . COPD Father   . Heart failure Grandchild   . Heart attack Brother   . Diabetes Brother   . Heart disease Brother     Before age 21  . Hyperlipidemia Brother   . Hypertension Brother   . Cancer Sister     small    Surgical History Past Surgical History  Procedure Laterality Date  . Breast surgery      left lumpectomy for Cancer  . Coronary artery bypass graft  12/21/2009    Cabg X3  Dr. Prescott Gum  . Tubal ligation    . Cholecystectomy      Laparoscopic  . Tonsillectomy      Allergies  Allergen Reactions  . Iohexol      Code: SOB, Desc: ct @ ser w/ throat swelling & sob, ok w/ 13 hr prep//a.c., Onset Date: [id number]  Current Outpatient Prescriptions  Medication Sig Dispense Refill  . albuterol (PROVENTIL HFA;VENTOLIN HFA) 108 (90 BASE) MCG/ACT inhaler Inhale 2 puffs into the lungs every 6 (six) hours as needed for wheezing.  1 Inhaler  1  . allopurinol (ZYLOPRIM) 100 MG tablet Take 1 tablet by mouth Daily.      Marland Kitchen ALPRAZolam (XANAX) 0.25 MG tablet Once daily as needed.      Marland Kitchen aspirin 81 MG chewable tablet Chew 81 mg by mouth daily.      . CRESTOR 40 MG tablet TAKE 1 TABLET BY MOUTH DAILY  30 tablet  0  . dicyclomine (BENTYL) 10 MG capsule Take 20 mg by mouth.       Marland Kitchen HYDROcodone-acetaminophen (LORTAB) 7.5-500 MG per tablet as needed.      Marland Kitchen ipratropium (ATROVENT) 0.02 % nebulizer solution       . metoprolol (LOPRESSOR) 50 MG tablet Take 25 mg by mouth 2 (two) times daily.      . predniSONE (DELTASONE) 20 MG tablet Take 1 tablet  by mouth daily.      Marland Kitchen PROCTOSOL HC 2.5 % rectal cream as directed.      . verapamil (COVERA HS) 240 MG (CO) 24 hr tablet Take 240 mg by mouth at bedtime.        Marland Kitchen zolpidem (AMBIEN) 10 MG tablet as needed. FOR SLEEP      . oxyCODONE-acetaminophen (PERCOCET) 10-325 MG per tablet Take 1 tablet by mouth daily.       No current facility-administered medications for this visit.    REVIEW OF SYSTEMS: see HPI for pertinent positives and negatives    Physical Examination  Filed Vitals:   07/13/14 0951  BP: 154/84  Pulse: 66  Resp: 16  Height: 5\' 3"  (1.6 m)  Weight: 187 lb (84.823 kg)  SpO2: 98%   Body mass index is 33.13 kg/(m^2).   General: The patient appears their stated age.  Obese female. HEENT:  No gross abnormalities Pulmonary: Respirations are non-labored Abdomen: Soft and non-tender. Musculoskeletal: There are no major deformities.   Neurologic: No focal weakness or paresthesias are detected, Skin: There are no ulcer or rashes noted. Psychiatric: The patient has normal affect. Cardiovascular: There is a regular rate and rhythm without significant murmur appreciated.   Vascular: Vessel Right Left  Radial 2+Palpable 2+Palpable  Carotid  without bruit  without bruit  Aorta Not palpable N/A  Femoral 1+Palpable 1+Palpable  Popliteal Not palpable Not palpable  PT notPalpable notPalpable  DP 2+Palpable 2+Palpable    Non-Invasive Vascular Imaging  Renal Duplex (Date: 07/13/2014) RENAL ARTERY DUPLEX EVALUATION    INDICATION: Atherosclerosis    PREVIOUS INTERVENTION(S): copd    DUPLEX EXAM:     AORTA Peak Systolic Velocity (cm/s): 102    RIGHT  LEFT   Peak Systolic Velocities (cm/s) Comments  Peak Systolic Velocities (cm/s) Comments  101  Renal Artery Origin/Proximal 231   200  Renal Artery Mid 115   123  Renal Artery Distal 78   -  Accessory Renal Artery (when present) -   1.9  Renal / Aortic Ratio (RAR) 2.2  9.0 Kidney Size (cm) 9.0  phasic  Renal Vein  phasic    ADDITIONAL FINDINGS:     IMPRESSION: 1. Suboptimal exam due to body habitus and breathing artifact. 2. No visualized stenosis of the right renal artery 3. 60 - 79 % left renal artery stenosis, limited visualization    Compared to the previous exam:  No change  Medical Decision Making  PERSEPHONE SCHRIEVER is a 73 y.o. female who presents with: left RAS.  Based on the patient's vascular studies and examination, and after discussing with Dr. Donnetta Hutching, I have offered the patient: return in 1 year for renal artery Duplex.  I discussed in depth with the patient the nature of atherosclerosis, and emphasized the importance of maximal medical management including strict control of blood pressure, blood glucose, and lipid levels, antiplatelet agents, obtaining regular exercise, and cessation of smoking.  The patient is aware that without maximal medical management the underlying atherosclerotic disease process will progress, limiting the benefit of any interventions.  Thank you for allowing [place] to participate in this patient's care.  Leeandra Ellerson, Sharmon Leyden, RN, MSN, FNP-C [facility] Office: [phone]  07/13/2014, 9:56 AM  Clinic MD:  Early

## 2014-07-13 NOTE — Patient Instructions (Signed)
Renal Artery Stenosis Renal artery stenosis (RAS) is the narrowing of the artery that supplies blood to the kidney. If the narrowing is critical and the kidney does not get enough blood, hypertension (high blood pressure) can develop. This is called renal vascular hypertension (RVH). This is a common, uncommon cause of secondary hypertension. It does not usually happen until there is at least a 70% narrowing of the artery. Decreased blood flow through the renal artery causes the kidney to release increased amounts of a hormone. It is called renin. Renin is a strong blood pressure regulator. When it is high, it causes changes that lead to hypertension. Eventually the kidney not receiving enough blood may shrink in size and become less useful. The high blood pressure that is produced can eventually damage and destroy the remaining kidney. This is called hypertensive nephrosclerosis. If both kidneys fail, it will lead to chronic renal failure.  CAUSES  Most renal artery stenosis is caused by a hardening of the arteries (atherosclerosis). This is called Atherosclerotic Renal Artery Stenosis (AS-RAS). It is caused by a build-up of cholesterol (plaques) on the inner lining of the renal artery. A much less common cause is Fibromuscular Dysplasia (FMD). With it, there is an abnormality in the muscular lining of the renal artery. FMD-RAS occurs almost exclusively in women aged 71 to 29. It rarely affects African Americans or Asians.  SYMPTOMS  Often high blood pressure is discovered on a routine blood pressure check. It may be the only sign that something is wrong. Other problems that may occur are:  You may develop calf pain when walking. This is called intermittent claudication. It may be a sign of bad circulation in the legs.  Inability to use certain blood pressure pills such as angiotensin-I (ACE-I) inhibitors or angiotensin receptor blockers (ARB's). These could cause sudden drops in blood pressure with  worsening of kidney function.  More than three antihypertensive medications may be needed for blood pressure control.  New onset of high blood pressure if you are over 55. DIAGNOSIS  Your caregiver may find suggestions of this on exam if he finds bruits (like murmurs) on listening to your abdomen (belly) or the large arteries in your neck. Your caregiver may also suspect this there is a sudden worsening of your blood pressure when it has been well controlled and you are over age 22. Additional testing that may be done includes:  One diagnostic method used for renal artery stenosis (RAS) is to measure and compare the level of renin, (blood pressure-regulating hormone released by the kidneys), in the right to the left renal veins. If the amount of renin released by one-side is markedly higher than the other, this identifies a high renin-releasing kidney consistent with RAS.  FMD-RAS is often found on renal scan with ACE-inhibitor challenge, or ultrasound with Doppler.  FMD responds well to angioplasty and stenting. The results of stenting in FMD are usually long lasting. RISK FACTORS  Most renal artery stenosis is caused by a hardening of the arteries. This is called atherosclerosis. Other risk factors associated with the development of atherosclerotic RAS include the following:   Carotid artery disease.  Obesity.  High blood pressure.  Heredity.  Old age.  Fibromuscular dysplasia.  Diabetes mellitus.  Smoking.  Hardening of the arteries. TREATMENT   Renal vascular hypertension can be very severe. It can also be difficult to control.  Medication is used to control high blood pressure (hypertension). Blood pressure medications that directly affect the renin angiotensin pathway  can be used toe help control blood pressure. ACE inhibitors and angiotensin receptor blockers (ARB's) are often effective in patients with unilateral RAS. In some cases, patients with RAS are resistant to  these medications.  In patients with bilateral RAS, these medications must be used carefully. They may cause acute renal failure (ARF). If acute renal failure develops (if creatinine increases by more than 30%), the medication is discontinued. The patient is evaluated for bilateral RAS.  Angioplasty and stenting may be used to improve blood flow. The goal is to improve the circulation of blood flow to the kidney and prevent the release of excess renin, which can help to decrease blood pressure. This helps to prevent atrophy of the kidney. In general, patients with AS-RAS should have stenting done. This is because plasty by itself has a high incidence of re-stenosis.  Surgery to bypass the narrowing may be done. If the kidney with RAS has diminished in size or strength (atrophied ), surgical removal of the kidney may be advised. This is called nephrectomy. Document Released: 07/09/2005 Document Revised: 01/05/2012 Document Reviewed: 01/26/2014 [facility] Patient Information 2015 [place], [place]. This information is not intended to replace advice given to you by your health care provider. Make sure you discuss any questions you have with your health care provider.

## 2014-07-14 NOTE — Addendum Note (Signed)
Addended by: Mena Goes on: 07/14/2014 09:31 AM   Modules accepted: Orders

## 2014-08-31 NOTE — Progress Notes (Signed)
Erroneous Encounter

## 2014-09-25 ENCOUNTER — Ambulatory Visit (INDEPENDENT_AMBULATORY_CARE_PROVIDER_SITE_OTHER): Payer: Commercial Managed Care - HMO | Admitting: Cardiovascular Disease

## 2014-09-25 ENCOUNTER — Encounter: Payer: Self-pay | Admitting: Cardiovascular Disease

## 2014-09-25 VITALS — BP 128/80 | HR 79 | Ht 63.0 in | Wt 182.8 lb

## 2014-09-25 DIAGNOSIS — I25700 Atherosclerosis of coronary artery bypass graft(s), unspecified, with unstable angina pectoris: Secondary | ICD-10-CM

## 2014-09-25 DIAGNOSIS — R072 Precordial pain: Secondary | ICD-10-CM

## 2014-09-25 DIAGNOSIS — E785 Hyperlipidemia, unspecified: Secondary | ICD-10-CM

## 2014-09-25 DIAGNOSIS — I10 Essential (primary) hypertension: Secondary | ICD-10-CM

## 2014-09-25 DIAGNOSIS — I480 Paroxysmal atrial fibrillation: Secondary | ICD-10-CM

## 2014-09-25 NOTE — Progress Notes (Signed)
History of Present Illness: 73 yo WF with history of CAD s/p CABG in 2011, RBBB, renal artery stenosis, HTN, HLD, COPD, former tobacco abuse, breast cancer s/p lumpectomy 1997, post-op atrial fibrillation and anemia here today for cardiac follow up. She has been followed in the past by Dr Doreatha Lew. Her coronary artery bypass grafting was in February 2011. She had a left internal mammary artery to the LAD, vein graft to OM2, and a vein graft to the RCA. Echo November 2011 with normal LV function with LVEF of 70%, severe LVH, thickened MV. Dr Early follows her renal artery stenosis. She is known to have diastolic dysfunction and had some LE edema, was started on Lasix which improvement in lower extremity edema. She was seen in our office April 2015 and c/o chest pain, dyspnea and palpitations. I arranged a stress myoview but she cancelled. Beta blocker increased for palpitations.   She is here today for follow up. She reports worsening of her chest pains. Constant pressure all day in the center of her chest. Worsens with exertion. Associated dyspnea. She has baseline SOB with COPD.   Primary Care Physician: Leanna Battles   Last Lipid Profile: Followed in primary care.   Past Medical History  Diagnosis Date  . Left renal artery stenosis     a. Moderate L RAS  . Hyperlipidemia   . Hypertension   . Cancer     Left breast  . COPD (chronic obstructive pulmonary disease)   . Arthritis   . Atrial fibrillation   . Coronary artery disease     a. 11/2009 CABGx3: LIMA->LAD, VG->RCA, VG->OM2.  . Chronic diastolic CHF (congestive heart failure)     a. 08/2010 Echo: EF 65-70%, Gr 1 DD, Mild MR  . Back pain July 2014  . Scoliosis of thoracic spine     Past Surgical History  Procedure Laterality Date  . Breast surgery      left lumpectomy for Cancer  . Coronary artery bypass graft  12/21/2009    Cabg X3  Dr. Prescott Gum  . Tubal ligation    . Cholecystectomy      Laparoscopic  . Tonsillectomy       Current Outpatient Prescriptions  Medication Sig Dispense Refill  . albuterol (PROVENTIL HFA;VENTOLIN HFA) 108 (90 BASE) MCG/ACT inhaler Inhale 2 puffs into the lungs every 6 (six) hours as needed for wheezing. 1 Inhaler 1  . allopurinol (ZYLOPRIM) 100 MG tablet Take 1 tablet by mouth Daily.    Marland Kitchen ALPRAZolam (XANAX) 0.25 MG tablet Once daily as needed.    Marland Kitchen aspirin 81 MG chewable tablet Chew 81 mg by mouth daily.    . CRESTOR 40 MG tablet TAKE 1 TABLET BY MOUTH DAILY 30 tablet 0  . HYDROcodone-acetaminophen (NORCO) 10-325 MG per tablet as needed.    Marland Kitchen ipratropium (ATROVENT) 0.02 % nebulizer solution Take 0.5 mg by nebulization every 2 (two) hours as needed.     . metoprolol (LOPRESSOR) 50 MG tablet Take 25 mg by mouth 2 (two) times daily.    Marland Kitchen NITROSTAT 0.4 MG SL tablet as needed.  12  . PROCTOSOL HC 2.5 % rectal cream as directed.    . verapamil (COVERA HS) 240 MG (CO) 24 hr tablet Take 240 mg by mouth at bedtime.      Marland Kitchen zolpidem (AMBIEN) 10 MG tablet as needed. FOR SLEEP     No current facility-administered medications for this visit.    Allergies  Allergen Reactions  . Iohexol  Code: SOB, Desc: ct @ ser w/ throat swelling & sob, ok w/ 13 hr prep//a.c., Onset Date: [id number]     History   Social History  . Marital Status: Married    Spouse Name: N/A    Number of Children: 6  . Years of Education: N/A   Occupational History  . Retired-[employer]    Social History Main Topics  . Smoking status: Former Smoker -- 1.50 packs/day for 50 years    Types: Cigarettes    Quit date: 01/01/2010  . Smokeless tobacco: Never Used  . Alcohol Use: No  . Drug Use: No  . Sexual Activity: Not on file   Other Topics Concern  . Not on file   Social History Narrative    Family History  Problem Relation Age of Onset  . Heart failure Mother   . Heart disease Mother     Before age 71  . Diabetes Mother   . Hyperlipidemia Mother   . Hypertension Mother   . COPD Father   .  Heart failure Grandchild   . Heart attack Brother   . Diabetes Brother   . Heart disease Brother     Before age 61  . Hyperlipidemia Brother   . Hypertension Brother   . Cancer Sister     small    Review of Systems:  As stated in the HPI and otherwise negative.   BP 128/80 mmHg  Pulse 79  Ht 5\' 3"  (1.6 m)  Wt 182 lb 12.8 oz (82.918 kg)  BMI 32.39 kg/m2  Physical Examination: General: Well developed, well nourished, NAD HEENT: OP clear, mucus membranes moist SKIN: warm, dry. No rashes. Neuro: No focal deficits Musculoskeletal: Muscle strength 5/5 all ext Psychiatric: Mood and affect normal Neck: No JVD, no carotid bruits, no thyromegaly, no lymphadenopathy. Lungs:Clear bilaterally, no wheezes, rhonci, crackles Cardiovascular: Regular rate and rhythm. No murmurs, gallops or rubs. Abdomen:Soft. Bowel sounds present. Non-tender.  Extremities: No lower extremity edema. Pulses are 2 + in the bilateral DP/PT.  EKG: NSR, rate 79 bpm. RBBB. PACs  Echo 05/27/12:  Left ventricle: The cavity size was normal. There was mild focal basal hypertrophy of the septum. Systolic function was normal. The estimated ejection fraction was in the range of 60% to 65%. Wall motion was normal; there were no regional wall motion abnormalities. Doppler parameters are consistent with abnormal left ventricular relaxation (grade 1 diastolic dysfunction). - Aortic valve: Trivial regurgitation. - Mitral valve: Calcified annulus. Mild regurgitation. - Left atrium: The atrium was moderately dilated. - Right atrium: The atrium was mildly dilated. - Tricuspid valve: Mild-moderate regurgitation.  Assessment and Plan:   1. CAD/chest pain: Recent chest pains/tightness consistent with unstable angina. Known to have CAD with prior CABG. We have discussed a cardiac cath. She wishes to think about this. I have recommended that we proceed with a cath. She will call back to let us know if she would like to arrange.  Continue current therapy for now.  2. HYPERTENSION: BP elevated. Will increase Lopressor to 50 mg po BID.   3. Renal artery stenosis: Followed in [facility] by Dr. Donnetta Hutching.   4. Hyperlipidemia: She is on a statin. Lipids are followed in primary care.   5. Paroxysmal atrial fibrillation: Occurred post-op in 2011. Maintaining sinus.

## 2014-09-25 NOTE — Patient Instructions (Signed)
Call us if you would like to schedule a catheterization

## 2014-09-27 ENCOUNTER — Encounter: Payer: Self-pay | Admitting: *Deleted

## 2014-09-27 ENCOUNTER — Telehealth: Payer: Self-pay | Admitting: *Deleted

## 2014-09-27 DIAGNOSIS — R079 Chest pain, unspecified: Secondary | ICD-10-CM

## 2014-09-27 MED ORDER — PREDNISONE 20 MG PO TABS: ORAL_TABLET | ORAL | Status: DC

## 2014-09-27 NOTE — Telephone Encounter (Signed)
Spoke with pt and she would like to proceed with cath. She is available on December 16 or 18.  Will arrange and call pt back with instructions.

## 2014-09-27 NOTE — Telephone Encounter (Signed)
New Message  Pt wants information on Cath; please call back and discuss

## 2014-09-27 NOTE — Telephone Encounter (Signed)
Left message to call back  

## 2014-09-27 NOTE — Telephone Encounter (Signed)
Cath arranged for October 11, 2014 at 10:00.  Pt is allergic to dye.  She will take prednisone 60 mg at 6 PM and 10 PM evening before procedure and at 8 AM morning of procedure.  Will send prescription to [facility] on [address].  She will come in for lab work on December 14.  Post cath appt set up for November 24, 2014 at 11:30.  I verbally went over all instructions with pt and will mail copy to her.

## 2014-09-28 NOTE — Telephone Encounter (Signed)
Cath orders placed. cdm 

## 2014-09-28 NOTE — Addendum Note (Signed)
Addended by: Lauree Chandler D on: 09/28/2014 01:52 PM   Modules accepted: Orders

## 2014-10-09 ENCOUNTER — Other Ambulatory Visit (INDEPENDENT_AMBULATORY_CARE_PROVIDER_SITE_OTHER): Payer: Commercial Managed Care - HMO | Admitting: *Deleted

## 2014-10-09 DIAGNOSIS — R079 Chest pain, unspecified: Secondary | ICD-10-CM

## 2014-10-09 LAB — BASIC METABOLIC PANEL
BUN: 18 mg/dL (ref 6–23)
CALCIUM: 9.4 mg/dL (ref 8.4–10.5)
CO2: 24 mEq/L (ref 19–32)
Chloride: 106 mEq/L (ref 96–112)
Creatinine, Ser: 1.2 mg/dL (ref 0.4–1.2)
GFR: 47.84 mL/min — AB (ref >60.00)
Glucose, Bld: 103 mg/dL — ABNORMAL HIGH (ref 70–99)
POTASSIUM: 4.3 meq/L (ref 3.5–5.1)
SODIUM: 138 meq/L (ref 135–145)

## 2014-10-09 LAB — PROTIME-INR
INR: 1 ratio (ref 0.8–1.0)
PROTHROMBIN TIME: 10.9 s (ref 9.6–13.1)

## 2014-10-09 LAB — CBC
HCT: 37.4 % (ref 36.0–46.0)
Hemoglobin: 12 g/dL (ref 12.0–15.0)
MCHC: 32 g/dL (ref 30.0–36.0)
MCV: 79.8 fl (ref 78.0–100.0)
PLATELETS: 235 10*3/uL (ref 150.0–400.0)
RBC: 4.68 Mil/uL (ref 3.87–5.11)
RDW: 16.9 % — ABNORMAL HIGH (ref 11.5–15.5)
WBC: 7.4 10*3/uL (ref 4.0–10.5)

## 2014-10-11 ENCOUNTER — Encounter (HOSPITAL_COMMUNITY): Payer: Self-pay | Admitting: General Practice

## 2014-10-11 ENCOUNTER — Ambulatory Visit (HOSPITAL_COMMUNITY)
Admission: RE | Admit: 2014-10-11 | Discharge: 2014-10-12 | Disposition: A | Discharge status: Home / Self Care | Payer: Commercial Managed Care - HMO | Source: Ambulatory Visit | Attending: Cardiovascular Disease | Admitting: Cardiovascular Disease

## 2014-10-11 ENCOUNTER — Encounter (HOSPITAL_COMMUNITY)
Admission: RE | Disposition: A | Discharge status: Home / Self Care | Payer: Commercial Managed Care - HMO | Source: Ambulatory Visit | Attending: Cardiovascular Disease

## 2014-10-11 DIAGNOSIS — Z955 Presence of coronary angioplasty implant and graft: Secondary | ICD-10-CM | POA: Insufficient documentation

## 2014-10-11 DIAGNOSIS — R072 Precordial pain: Secondary | ICD-10-CM

## 2014-10-11 DIAGNOSIS — I701 Atherosclerosis of renal artery: Secondary | ICD-10-CM | POA: Insufficient documentation

## 2014-10-11 DIAGNOSIS — Z853 Personal history of malignant neoplasm of breast: Secondary | ICD-10-CM | POA: Diagnosis not present

## 2014-10-11 DIAGNOSIS — I480 Paroxysmal atrial fibrillation: Secondary | ICD-10-CM | POA: Insufficient documentation

## 2014-10-11 DIAGNOSIS — F17200 Nicotine dependence, unspecified, uncomplicated: Secondary | ICD-10-CM | POA: Diagnosis present

## 2014-10-11 DIAGNOSIS — E785 Hyperlipidemia, unspecified: Secondary | ICD-10-CM | POA: Diagnosis not present

## 2014-10-11 DIAGNOSIS — I5032 Chronic diastolic (congestive) heart failure: Secondary | ICD-10-CM | POA: Diagnosis not present

## 2014-10-11 DIAGNOSIS — I10 Essential (primary) hypertension: Secondary | ICD-10-CM | POA: Diagnosis not present

## 2014-10-11 DIAGNOSIS — R9431 Abnormal electrocardiogram [ECG] [EKG]: Secondary | ICD-10-CM | POA: Diagnosis not present

## 2014-10-11 DIAGNOSIS — I25710 Atherosclerosis of autologous vein coronary artery bypass graft(s) with unstable angina pectoris: Secondary | ICD-10-CM | POA: Insufficient documentation

## 2014-10-11 DIAGNOSIS — I25110 Atherosclerotic heart disease of native coronary artery with unstable angina pectoris: Secondary | ICD-10-CM | POA: Diagnosis present

## 2014-10-11 DIAGNOSIS — Z951 Presence of aortocoronary bypass graft: Secondary | ICD-10-CM | POA: Insufficient documentation

## 2014-10-11 DIAGNOSIS — J449 Chronic obstructive pulmonary disease, unspecified: Secondary | ICD-10-CM | POA: Insufficient documentation

## 2014-10-11 DIAGNOSIS — Z87891 Personal history of nicotine dependence: Secondary | ICD-10-CM | POA: Insufficient documentation

## 2014-10-11 DIAGNOSIS — I200 Unstable angina: Secondary | ICD-10-CM | POA: Diagnosis present

## 2014-10-11 DIAGNOSIS — I4510 Unspecified right bundle-branch block: Secondary | ICD-10-CM | POA: Diagnosis not present

## 2014-10-11 HISTORY — DX: Low back pain, unspecified: M54.50

## 2014-10-11 HISTORY — DX: Low back pain: M54.5

## 2014-10-11 HISTORY — DX: Gastro-esophageal reflux disease without esophagitis: K21.9

## 2014-10-11 HISTORY — DX: Cardiac murmur, unspecified: R01.1

## 2014-10-11 HISTORY — PX: CORONARY ANGIOPLASTY WITH STENT PLACEMENT: SHX49

## 2014-10-11 HISTORY — DX: Malignant neoplasm of unspecified site of unspecified female breast: C50.919

## 2014-10-11 HISTORY — PX: LEFT HEART CATHETERIZATION WITH CORONARY ANGIOGRAM: SHX5451

## 2014-10-11 HISTORY — DX: Acute myocardial infarction, unspecified: I21.9

## 2014-10-11 HISTORY — DX: Other chronic pain: G89.29

## 2014-10-11 HISTORY — DX: Personal history of other medical treatment: Z92.89

## 2014-10-11 LAB — POCT ACTIVATED CLOTTING TIME: Activated Clotting Time: 509 seconds

## 2014-10-11 SURGERY — LEFT HEART CATHETERIZATION WITH CORONARY ANGIOGRAM
Anesthesia: LOCAL

## 2014-10-11 MED ORDER — SODIUM CHLORIDE 0.9 % IV SOLN
INTRAVENOUS | Status: DC
  Administered 2014-10-11: 09:00:00 via INTRAVENOUS

## 2014-10-11 MED ORDER — MIDAZOLAM HCL 2 MG/2ML IJ SOLN
INTRAMUSCULAR | Status: AC
  Filled 2014-10-11: qty 2

## 2014-10-11 MED ORDER — ASPIRIN 81 MG PO CHEW
81.0000 mg | CHEWABLE_TABLET | ORAL | Status: AC
  Administered 2014-10-11: 81 mg via ORAL

## 2014-10-11 MED ORDER — PANTOPRAZOLE SODIUM 40 MG PO TBEC
40.0000 mg | DELAYED_RELEASE_TABLET | Freq: Every day | ORAL | Status: DC
  Administered 2014-10-11 – 2014-10-12 (×2): 40 mg via ORAL
  Filled 2014-10-11 (×2): qty 1

## 2014-10-11 MED ORDER — BIVALIRUDIN 250 MG IV SOLR
INTRAVENOUS | Status: AC
  Filled 2014-10-11: qty 250

## 2014-10-11 MED ORDER — IPRATROPIUM BROMIDE 0.02 % IN SOLN
0.5000 mg | Freq: Two times a day (BID) | RESPIRATORY_TRACT | Status: DC
Start: 2014-10-11 — End: 2014-10-12
  Administered 2014-10-11: 20:00:00 0.5 mg via RESPIRATORY_TRACT
  Filled 2014-10-11: qty 2.5

## 2014-10-11 MED ORDER — ALBUTEROL SULFATE (2.5 MG/3ML) 0.083% IN NEBU
2.5000 mg | INHALATION_SOLUTION | Freq: Two times a day (BID) | RESPIRATORY_TRACT | Status: DC
  Administered 2014-10-11: 20:00:00 2.5 mg via RESPIRATORY_TRACT
  Filled 2014-10-11: qty 3

## 2014-10-11 MED ORDER — BUDESONIDE-FORMOTEROL FUMARATE 160-4.5 MCG/ACT IN AERO
2.0000 | INHALATION_SPRAY | Freq: Two times a day (BID) | RESPIRATORY_TRACT | Status: DC
Start: 2014-10-11 — End: 2014-10-12
  Administered 2014-10-11 – 2014-10-12 (×2): 2 via RESPIRATORY_TRACT
  Filled 2014-10-11: qty 6

## 2014-10-11 MED ORDER — VERAPAMIL HCL 2.5 MG/ML IV SOLN
INTRAVENOUS | Status: AC
  Filled 2014-10-11: qty 2

## 2014-10-11 MED ORDER — ALBUTEROL SULFATE (2.5 MG/3ML) 0.083% IN NEBU: 2.5000 mg | INHALATION_SOLUTION | Freq: Four times a day (QID) | RESPIRATORY_TRACT | Status: DC | PRN

## 2014-10-11 MED ORDER — VERAPAMIL HCL ER 120 MG PO TBCR
240.0000 mg | EXTENDED_RELEASE_TABLET | Freq: Every day | ORAL | Status: DC
  Administered 2014-10-12: 09:00:00 240 mg via ORAL
  Filled 2014-10-11: qty 2

## 2014-10-11 MED ORDER — HEPARIN (PORCINE) IN NACL 2-0.9 UNIT/ML-% IJ SOLN
INTRAMUSCULAR | Status: AC
  Filled 2014-10-11: qty 1500

## 2014-10-11 MED ORDER — ONDANSETRON HCL 4 MG/2ML IJ SOLN: 4.0000 mg | Freq: Four times a day (QID) | INTRAMUSCULAR | Status: DC | PRN

## 2014-10-11 MED ORDER — ALBUTEROL SULFATE HFA 108 (90 BASE) MCG/ACT IN AERS: 1.0000 | INHALATION_SPRAY | Freq: Four times a day (QID) | RESPIRATORY_TRACT | Status: DC | PRN

## 2014-10-11 MED ORDER — CLOPIDOGREL BISULFATE 300 MG PO TABS
ORAL_TABLET | ORAL | Status: AC
  Filled 2014-10-11: qty 2

## 2014-10-11 MED ORDER — FENTANYL CITRATE 0.05 MG/ML IJ SOLN
INTRAMUSCULAR | Status: AC
  Filled 2014-10-11: qty 2

## 2014-10-11 MED ORDER — GI COCKTAIL ~~LOC~~
30.0000 mL | Freq: Once | ORAL | Status: AC
  Administered 2014-10-11: 30 mL via ORAL
  Filled 2014-10-11: qty 30

## 2014-10-11 MED ORDER — SODIUM CHLORIDE 0.9 % IJ SOLN: 3.0000 mL | Freq: Two times a day (BID) | INTRAMUSCULAR | Status: DC

## 2014-10-11 MED ORDER — SODIUM CHLORIDE 0.9 % IV SOLN: INTRAVENOUS | Status: AC

## 2014-10-11 MED ORDER — ASPIRIN 81 MG PO CHEW
81.0000 mg | CHEWABLE_TABLET | Freq: Every day | ORAL | Status: DC
  Administered 2014-10-12: 81 mg via ORAL
  Filled 2014-10-11: qty 1

## 2014-10-11 MED ORDER — SODIUM CHLORIDE 0.9 % IV SOLN: 250.0000 mL | INTRAVENOUS | Status: DC | PRN

## 2014-10-11 MED ORDER — ROSUVASTATIN CALCIUM 40 MG PO TABS
40.0000 mg | ORAL_TABLET | Freq: Every day | ORAL | Status: DC
  Administered 2014-10-11 – 2014-10-12 (×2): 40 mg via ORAL
  Filled 2014-10-11 (×2): qty 1

## 2014-10-11 MED ORDER — ZOLPIDEM TARTRATE 10 MG PO TABS: 10.0000 mg | ORAL_TABLET | Freq: Every evening | ORAL | Status: DC | PRN

## 2014-10-11 MED ORDER — OXYCODONE-ACETAMINOPHEN 5-325 MG PO TABS: 1.0000 | ORAL_TABLET | ORAL | Status: DC | PRN

## 2014-10-11 MED ORDER — FAMOTIDINE IN NACL 20-0.9 MG/50ML-% IV SOLN
INTRAVENOUS | Status: AC
Start: 2014-10-11 — End: 2014-10-11
  Filled 2014-10-11: qty 50

## 2014-10-11 MED ORDER — SODIUM CHLORIDE 0.9 % IJ SOLN
3.0000 mL | INTRAMUSCULAR | Status: DC | PRN
Start: 2014-10-11 — End: 2014-10-11

## 2014-10-11 MED ORDER — ZOLPIDEM TARTRATE 5 MG PO TABS
5.0000 mg | ORAL_TABLET | Freq: Every evening | ORAL | Status: DC | PRN
  Administered 2014-10-11: 5 mg via ORAL
  Filled 2014-10-11: qty 1

## 2014-10-11 MED ORDER — CLOPIDOGREL BISULFATE 75 MG PO TABS
75.0000 mg | ORAL_TABLET | Freq: Every day | ORAL | Status: DC
  Administered 2014-10-12: 09:00:00 75 mg via ORAL
  Filled 2014-10-11: qty 1

## 2014-10-11 MED ORDER — FAMOTIDINE IN NACL 20-0.9 MG/50ML-% IV SOLN
INTRAVENOUS | Status: AC
  Filled 2014-10-11: qty 50

## 2014-10-11 MED ORDER — METOPROLOL TARTRATE 1 MG/ML IV SOLN
INTRAVENOUS | Status: AC
  Filled 2014-10-11: qty 5

## 2014-10-11 MED ORDER — LIDOCAINE HCL (PF) 1 % IJ SOLN
INTRAMUSCULAR | Status: AC
  Filled 2014-10-11: qty 30

## 2014-10-11 MED ORDER — DIPHENHYDRAMINE HCL 50 MG/ML IJ SOLN
25.0000 mg | INTRAMUSCULAR | Status: AC
  Administered 2014-10-11: 25 mg via INTRAVENOUS

## 2014-10-11 MED ORDER — METOPROLOL TARTRATE 25 MG PO TABS
25.0000 mg | ORAL_TABLET | Freq: Two times a day (BID) | ORAL | Status: DC
  Administered 2014-10-11 – 2014-10-12 (×2): 25 mg via ORAL
  Filled 2014-10-11 (×3): qty 1

## 2014-10-11 MED ORDER — FAMOTIDINE IN NACL 20-0.9 MG/50ML-% IV SOLN
20.0000 mg | INTRAVENOUS | Status: AC
  Administered 2014-10-11: 20 mg via INTRAVENOUS

## 2014-10-11 MED ORDER — ACETAMINOPHEN 325 MG PO TABS: 650.0000 mg | ORAL_TABLET | ORAL | Status: DC | PRN

## 2014-10-11 MED ORDER — ASPIRIN 81 MG PO CHEW
CHEWABLE_TABLET | ORAL | Status: AC
  Filled 2014-10-11: qty 1

## 2014-10-11 MED ORDER — DIPHENHYDRAMINE HCL 50 MG/ML IJ SOLN
INTRAMUSCULAR | Status: AC
  Filled 2014-10-11: qty 1

## 2014-10-11 MED ORDER — NITROGLYCERIN 0.4 MG SL SUBL: 0.4000 mg | SUBLINGUAL_TABLET | SUBLINGUAL | Status: DC | PRN

## 2014-10-11 NOTE — H&P (View-Only) (Signed)
History of Present Illness: 73 yo WF with history of CAD s/p CABG in 2011, RBBB, renal artery stenosis, HTN, HLD, COPD, former tobacco abuse, breast cancer s/p lumpectomy 1997, post-op atrial fibrillation and anemia here today for cardiac follow up. She has been followed in the past by Dr Doreatha Lew. Her coronary artery bypass grafting was in February 2011. She had a left internal mammary artery to the LAD, vein graft to OM2, and a vein graft to the RCA. Echo November 2011 with normal LV function with LVEF of 70%, severe LVH, thickened MV. Dr Early follows her renal artery stenosis. She is known to have diastolic dysfunction and had some LE edema, was started on Lasix which improvement in lower extremity edema. She was seen in our office April 2015 and c/o chest pain, dyspnea and palpitations. I arranged a stress myoview but she cancelled. Beta blocker increased for palpitations.   She is here today for follow up. She reports worsening of her chest pains. Constant pressure all day in the center of her chest. Worsens with exertion. Associated dyspnea. She has baseline SOB with COPD.   Primary Care Physician: Leanna Battles   Last Lipid Profile: Followed in primary care.   Past Medical History  Diagnosis Date  . Left renal artery stenosis     a. Moderate L RAS  . Hyperlipidemia   . Hypertension   . Cancer     Left breast  . COPD (chronic obstructive pulmonary disease)   . Arthritis   . Atrial fibrillation   . Coronary artery disease     a. 11/2009 CABGx3: LIMA->LAD, VG->RCA, VG->OM2.  . Chronic diastolic CHF (congestive heart failure)     a. 08/2010 Echo: EF 65-70%, Gr 1 DD, Mild MR  . Back pain July 2014  . Scoliosis of thoracic spine     Past Surgical History  Procedure Laterality Date  . Breast surgery      left lumpectomy for Cancer  . Coronary artery bypass graft  12/21/2009    Cabg X3  Dr. Prescott Gum  . Tubal ligation    . Cholecystectomy      Laparoscopic  . Tonsillectomy       Current Outpatient Prescriptions  Medication Sig Dispense Refill  . albuterol (PROVENTIL HFA;VENTOLIN HFA) 108 (90 BASE) MCG/ACT inhaler Inhale 2 puffs into the lungs every 6 (six) hours as needed for wheezing. 1 Inhaler 1  . allopurinol (ZYLOPRIM) 100 MG tablet Take 1 tablet by mouth Daily.    Marland Kitchen ALPRAZolam (XANAX) 0.25 MG tablet Once daily as needed.    Marland Kitchen aspirin 81 MG chewable tablet Chew 81 mg by mouth daily.    . CRESTOR 40 MG tablet TAKE 1 TABLET BY MOUTH DAILY 30 tablet 0  . HYDROcodone-acetaminophen (NORCO) 10-325 MG per tablet as needed.    Marland Kitchen ipratropium (ATROVENT) 0.02 % nebulizer solution Take 0.5 mg by nebulization every 2 (two) hours as needed.     . metoprolol (LOPRESSOR) 50 MG tablet Take 25 mg by mouth 2 (two) times daily.    Marland Kitchen NITROSTAT 0.4 MG SL tablet as needed.  12  . PROCTOSOL HC 2.5 % rectal cream as directed.    . verapamil (COVERA HS) 240 MG (CO) 24 hr tablet Take 240 mg by mouth at bedtime.      Marland Kitchen zolpidem (AMBIEN) 10 MG tablet as needed. FOR SLEEP     No current facility-administered medications for this visit.    Allergies  Allergen Reactions  . Iohexol  Code: SOB, Desc: ct @ ser w/ throat swelling & sob, ok w/ 13 hr prep//a.c., Onset Date: [id number]     History   Social History  . Marital Status: Married    Spouse Name: N/A    Number of Children: 6  . Years of Education: N/A   Occupational History  . Retired-[employer]    Social History Main Topics  . Smoking status: Former Smoker -- 1.50 packs/day for 50 years    Types: Cigarettes    Quit date: 01/01/2010  . Smokeless tobacco: Never Used  . Alcohol Use: No  . Drug Use: No  . Sexual Activity: Not on file   Other Topics Concern  . Not on file   Social History Narrative    Family History  Problem Relation Age of Onset  . Heart failure Mother   . Heart disease Mother     Before age 69  . Diabetes Mother   . Hyperlipidemia Mother   . Hypertension Mother   . COPD Father   .  Heart failure Grandchild   . Heart attack Brother   . Diabetes Brother   . Heart disease Brother     Before age 5  . Hyperlipidemia Brother   . Hypertension Brother   . Cancer Sister     small    Review of Systems:  As stated in the HPI and otherwise negative.   BP 128/80 mmHg  Pulse 79  Ht 5\' 3"  (1.6 m)  Wt 182 lb 12.8 oz (82.918 kg)  BMI 32.39 kg/m2  Physical Examination: General: Well developed, well nourished, NAD HEENT: OP clear, mucus membranes moist SKIN: warm, dry. No rashes. Neuro: No focal deficits Musculoskeletal: Muscle strength 5/5 all ext Psychiatric: Mood and affect normal Neck: No JVD, no carotid bruits, no thyromegaly, no lymphadenopathy. Lungs:Clear bilaterally, no wheezes, rhonci, crackles Cardiovascular: Regular rate and rhythm. No murmurs, gallops or rubs. Abdomen:Soft. Bowel sounds present. Non-tender.  Extremities: No lower extremity edema. Pulses are 2 + in the bilateral DP/PT.  EKG: NSR, rate 79 bpm. RBBB. PACs  Echo 05/27/12:  Left ventricle: The cavity size was normal. There was mild focal basal hypertrophy of the septum. Systolic function was normal. The estimated ejection fraction was in the range of 60% to 65%. Wall motion was normal; there were no regional wall motion abnormalities. Doppler parameters are consistent with abnormal left ventricular relaxation (grade 1 diastolic dysfunction). - Aortic valve: Trivial regurgitation. - Mitral valve: Calcified annulus. Mild regurgitation. - Left atrium: The atrium was moderately dilated. - Right atrium: The atrium was mildly dilated. - Tricuspid valve: Mild-moderate regurgitation.  Assessment and Plan:   1. CAD/chest pain: Recent chest pains/tightness consistent with unstable angina. Known to have CAD with prior CABG. We have discussed a cardiac cath. She wishes to think about this. I have recommended that we proceed with a cath. She will call back to let us know if she would like to arrange.  Continue current therapy for now.  2. HYPERTENSION: BP elevated. Will increase Lopressor to 50 mg po BID.   3. Renal artery stenosis: Followed in [facility] by Dr. Donnetta Hutching.   4. Hyperlipidemia: She is on a statin. Lipids are followed in primary care.   5. Paroxysmal atrial fibrillation: Occurred post-op in 2011. Maintaining sinus.

## 2014-10-11 NOTE — Interval H&P Note (Signed)
History and Physical Interval Note:  10/11/2014 10:59 AM  Joanne [name]  has presented today for surgery, with the diagnosis of cp  The various methods of treatment have been discussed with the patient and family. After consideration of risks, benefits and other options for treatment, the patient has consented to  Procedure(s): LEFT HEART CATHETERIZATION WITH CORONARY ANGIOGRAM (N/A) as a surgical intervention .  The patient's history has been reviewed, patient examined, no change in status, stable for surgery.  I have reviewed the patient's chart and labs.  Questions were answered to the patient's satisfaction.    Cath Lab Visit (complete for each Cath Lab visit)  Clinical Evaluation Leading to the Procedure:   ACS: No.  Non-ACS:    Anginal Classification: CCS III  Anti-ischemic medical therapy: Maximal Therapy (2 or more classes of medications)  Non-Invasive Test Results: No non-invasive testing performed  Prior CABG: Previous CABG         MCALHANY,CHRISTOPHER

## 2014-10-11 NOTE — Progress Notes (Signed)
Pt's bp has been in the 034'J systolic since arriving to floor and pt states that is her normal. However, now bp is in the 160's-170 and pt has not had her bp meds today-lopressor and verapamil.  Brittnay PA paged and orders received to give 2200 po lopressor now and verapamil to start tom 12/17.

## 2014-10-11 NOTE — CV Procedure (Signed)
Cardiac Catheterization Operative Report  Joanne [name] [id number] 12/16/201512:28 PM Donnajean Lopes, MD  Procedure Performed:  1. Left Heart Catheterization 2. Selective Coronary Angiography 3. SVG angiography 4. LIMA graft angiography 5. Left ventricular angiogram 6. PTCA/DES x 1 ostial/proximal LAD  Operator: Lauree Chandler, MD  Indication:  73 yo female with history of CAD s/p CABG with recent exertional chest pain and fatigue c/w unstable angina.                                   Procedure Details: The risks, benefits, complications, treatment options, and expected outcomes were discussed with the patient. The patient and/or family concurred with the proposed plan, giving informed consent. The patient was brought to the cath lab after IV hydration was begun and oral premedication was given. The patient was further sedated with Versed and Fentanyl. The right groin was prepped and draped in the usual manner. Using the modified Seldinger access technique, a 5 French sheath was placed in the right femoral artery. Standard diagnostic catheters were used to perform selective coronary angiography. Both vein grafts were selectively engaged with the JR4 catheter. The LIMA graft was engaged with the IMA catheter.  A pigtail catheter was used to perform a left ventricular angiogram. The LIMA to the LAD is atretic and non-functional. The LAD is known to have an ostial 90% stenosis. PCI of the LAD was planned.   PCI Note: Angiomax weight based bolus and drip. Plavix 600 mg po x 1. LAD engaged with a XB LAD 3.5 guiding catheter. When the ACT was over 200, I passed a Cougar IC wire down the LAD. A 2.5 x 12 mm balloon was used to pre-dilate the ostial stenosis. A 3.5 x 12 mm Xience DES was deployed in the ostium of the LAD. The stent was post-dilated with a 3.75 x 8 mm Byron balloon x 1. The stenosis was taken from 90% down to 0%. Of note, the lesion extended back to the ostium of the LAD. There  was no left main. The Circumflex arose from another ostium. Angioseal placed right femoral artery.   There were no immediate complications. The patient was taken to the recovery area in stable condition.   Hemodynamic Findings: Central aortic pressure: 136/65 Left ventricular pressure: 133/4/21  Angiographic Findings:  Left main: No left main. The LAD and Circumflex arise from different ostia.   Left Anterior Descending Artery: Large caliber vessel that courses to the apex. The vessel becomes small in caliber past the mid segment. There is an ostial 90% stenosis. (There is significant pressure dampening of the catheter with engagement of the LAD). The mid vessel has 30% stenosis. Small caliber diagonal branch with mild plaque.   Circumflex Artery: Proximal 30% stenosis. Patent OM1 with no obstructive disease. The OM2 is a small to moderate caliber branch with filling from antegrade flow and from the patent vein graft. The distal Circumflex is occluded.   Right Coronary Artery: Large dominant vessel with diffuse 30% proximal and mid vessel stenosis. No flow limiting lesions noted.   Graft Anatomy:  SVG to OM2 is patent SVG to OM1 is occluded LIMA to LAD is atretic and non-functional  Left Ventricular Angiogram: LVEF=50-55%.   Impression: 1. Triple vessel CAD s/p 3 Vessel CABG with 1/3 patent grafts.  2. Severe stenosis ostium of native LAD, not protected by a graft (atretic flow in LIMA graft to this  vessel) 3. Preserved LV systolic function 4. Successful PTCA/DES x 1 ostium of LAD  Recommendations: She will need ASA and Plavix for at least one year. Continue statin and beta blocker.        Complications:  None. The patient tolerated the procedure well.

## 2014-10-12 ENCOUNTER — Encounter (HOSPITAL_COMMUNITY): Payer: Self-pay | Admitting: Physician Assistant

## 2014-10-12 DIAGNOSIS — I200 Unstable angina: Secondary | ICD-10-CM

## 2014-10-12 DIAGNOSIS — I25110 Atherosclerotic heart disease of native coronary artery with unstable angina pectoris: Secondary | ICD-10-CM | POA: Diagnosis not present

## 2014-10-12 DIAGNOSIS — I701 Atherosclerosis of renal artery: Secondary | ICD-10-CM | POA: Diagnosis not present

## 2014-10-12 DIAGNOSIS — I10 Essential (primary) hypertension: Secondary | ICD-10-CM

## 2014-10-12 DIAGNOSIS — I5032 Chronic diastolic (congestive) heart failure: Secondary | ICD-10-CM | POA: Diagnosis not present

## 2014-10-12 DIAGNOSIS — R072 Precordial pain: Secondary | ICD-10-CM

## 2014-10-12 DIAGNOSIS — J410 Simple chronic bronchitis: Secondary | ICD-10-CM

## 2014-10-12 DIAGNOSIS — I25710 Atherosclerosis of autologous vein coronary artery bypass graft(s) with unstable angina pectoris: Secondary | ICD-10-CM | POA: Diagnosis not present

## 2014-10-12 MED ORDER — IPRATROPIUM-ALBUTEROL 0.5-2.5 (3) MG/3ML IN SOLN
3.0000 mL | Freq: Two times a day (BID) | RESPIRATORY_TRACT | Status: DC
  Administered 2014-10-12: 3 mL via RESPIRATORY_TRACT
  Filled 2014-10-12: qty 3

## 2014-10-12 MED ORDER — CLOPIDOGREL BISULFATE 75 MG PO TABS: 75.0000 mg | ORAL_TABLET | Freq: Every day | ORAL | Status: DC

## 2014-10-12 MED ORDER — PANTOPRAZOLE SODIUM 40 MG PO TBEC: 40.0000 mg | DELAYED_RELEASE_TABLET | Freq: Every day | ORAL | Status: DC

## 2014-10-12 MED ORDER — ALUM & MAG HYDROXIDE-SIMETH 200-200-20 MG/5ML PO SUSP
30.0000 mL | ORAL | Status: DC | PRN
  Administered 2014-10-12: 11:00:00 30 mL via ORAL
  Filled 2014-10-12: qty 30

## 2014-10-12 MED FILL — Sodium Chloride IV Soln 0.9%: INTRAVENOUS | Qty: 50 | Status: AC

## 2014-10-12 NOTE — Progress Notes (Signed)
CARDIAC REHAB PHASE I   PRE:  Rate/Rhythm: 85 SR  BP:  Supine:   Sitting: 179/79  Standing:    SaO2:   MODE:  Ambulation: 600 ft   POST:  Rate/Rhythm: 100 ST  BP:  Supine:   Sitting: 199/64  Standing:    SaO2:  [id number] Pt walked 600 ft on RA with steady gait. Some DOE with COPD but no CP or tightness. BP elevated but pt stated she has not been restarted on some of her meds. Education completed with pt who voiced understanding. Discussed heart healthy food choices and walking for ex as pt wants to lose some weight. Declined CRP 2 but stated she is thinking of joining [employer] which will be free for her. Discussed importance of plavix and reviewed NTG use.   Graylon Good, RN BSN  10/12/2014 8:42 AM

## 2014-10-12 NOTE — Discharge Instructions (Signed)
No driving for 24 hours. No lifting over 5 lbs for 1 week. No sexual activity for 1 week. Keep procedure site clean & dry. If you notice increased pain, swelling, bleeding or pus, call/return!  You may shower, but no soaking baths/hot tubs/pools for 1 week.  ° ° °

## 2014-10-12 NOTE — Discharge Summary (Signed)
Discharge Summary   Patient ID: Joanne [name],  MRN: [id number], DOB/AGE: Nov 28, 1940 73 y.o.  Admit date: 10/11/2014 Discharge date: 10/12/2014  Primary Care Provider: Donnajean Lopes Primary Cardiologist: Dr. Angelena Form  Discharge Diagnoses Principal Problem:   Unstable angina Active Problems:   TOBACCO ABUSE   Essential hypertension   COPD (chronic obstructive pulmonary disease)   Allergies Allergies  Allergen Reactions  . Iohexol      Code: SOB, Desc: ct @ ser w/ throat swelling & sob, ok w/ 13 hr prep//a.c., Onset Date: [id number]     Procedures  Cardiac catheterization Procedure Performed:  1. Left Heart Catheterization 2. Selective Coronary Angiography 3. SVG angiography 4. LIMA graft angiography 5. Left ventricular angiogram 6. PTCA/DES x 1 ostial/proximal LAD  Hemodynamic Findings: Central aortic pressure: 136/65 Left ventricular pressure: 133/4/21  Angiographic Findings:  Left main: No left main. The LAD and Circumflex arise from different ostia.   Left Anterior Descending Artery: Large caliber vessel that courses to the apex. The vessel becomes small in caliber past the mid segment. There is an ostial 90% stenosis. (There is significant pressure dampening of the catheter with engagement of the LAD). The mid vessel has 30% stenosis. Small caliber diagonal branch with mild plaque.   Circumflex Artery: Proximal 30% stenosis. Patent OM1 with no obstructive disease. The OM2 is a small to moderate caliber branch with filling from antegrade flow and from the patent vein graft. The distal Circumflex is occluded.   Right Coronary Artery: Large dominant vessel with diffuse 30% proximal and mid vessel stenosis. No flow limiting lesions noted.   Graft Anatomy:  SVG to OM2 is patent SVG to OM1 is occluded LIMA to LAD is atretic and non-functional  Left Ventricular Angiogram: LVEF=50-55%.   Impression: 1. Triple vessel CAD s/p 3 Vessel CABG with 1/3 patent  grafts.  2. Severe stenosis ostium of native LAD, not protected by a graft (atretic flow in LIMA graft to this vessel) 3. Preserved LV systolic function 4. Successful PTCA/DES x 1 ostium of LAD  Recommendations: She will need ASA and Plavix for at least one year. Continue statin and beta blocker.    Complications: None. The patient tolerated the procedure well.       Hospital Course  The patient is a 73 year old female with history of CAD s/p CABG in 2011, RBBB, renal artery stenosis followed by Dr. Donnetta Hutching of [facility], hypertension, hyperlipidemia, COPD, former smoker and history of breast cancer status post lumpectomy in 1997. She had a history of postop atrial fibrillation. She stopped smoking 6 yrs ago. Patient was seen by Dr. Angelena Form in the clinic on 09/25/2014 at which time she complained of worsening chest pain with exertion and also have associated dyspnea. Dr. Angelena Form recommended cardiac catheterization, however patient wished to think about it at home. She eventually agreed to undergo cardiac catheterization. Cardiac cath performed on 10/11/2014 showed triple-vessel disease status post three-vessel CABG. Patent SVG to OM 2, occluded SVG to OM1, atretic and nonfunctional LIMA to LAD. Native LAD has ostial 90% stenosis which was treated with drug-eluting stent. Post cath, patient was placed on aspirin and Plavix.  Patient was seen in the morning of 10/12/2014, at which time she denies any significant chest discomfort or shortness breath. She ambulated ok with cardiac rehab without significant discomfort. She is deemed stable for discharge from cardiology perspective. Patient has a follow-up scheduled with Dr. Angelena Form on January 29, however I was unable to schedule closer follow-up with any of the PA in  the clinic as the schedule was full at this time. Patient is encouraged to call cardiology clinic if she has any recurrent chest discomfort during this time.   Discharge  Vitals Blood pressure 179/79, pulse 88, temperature 97.2 F (36.2 C), temperature source Oral, resp. rate 17, height 5\' 3"  (1.6 m), weight 184 lb 1.4 oz (83.5 kg), SpO2 95 %.  Filed Weights   10/11/14 0805 10/12/14 0015  Weight: 183 lb (83.008 kg) 184 lb 1.4 oz (83.5 kg)    Labs  CBC No results for input(s): WBC, NEUTROABS, HGB, HCT, MCV, PLT in the last 72 hours. Basic Metabolic Panel No results for input(s): NA, K, CL, CO2, GLUCOSE, BUN, CREATININE, CALCIUM, MG, PHOS in the last 72 hours.  Disposition  Pt is being discharged home today in good condition.  Follow-up Plans & Appointments      Follow-up Information    Follow up with [facility], MD On 11/24/2014.   Specialty:  Cardiology   Why:  11:30am   Contact information:   [place] [place] [address] [phone]       Discharge Medications    Medication List    TAKE these medications        albuterol (2.5 MG/3ML) 0.083% nebulizer solution  Commonly known as:  PROVENTIL  Take 2.5 mg by nebulization 2 (two) times daily.     albuterol 108 (90 BASE) MCG/ACT inhaler  Commonly known as:  PROVENTIL HFA;VENTOLIN HFA  Inhale 1 puff into the lungs every 6 (six) hours as needed for wheezing or shortness of breath.     aspirin EC 81 MG tablet  Take 81 mg by mouth daily.     budesonide-formoterol 160-4.5 MCG/ACT inhaler  Commonly known as:  SYMBICORT  Inhale 2 puffs into the lungs 2 (two) times daily.     clopidogrel 75 MG tablet  Commonly known as:  PLAVIX  Take 1 tablet (75 mg total) by mouth daily with breakfast.     CRESTOR 40 MG tablet  Generic drug:  rosuvastatin  TAKE 1 TABLET BY MOUTH DAILY     HYDROcodone-acetaminophen 10-325 MG per tablet  Commonly known as:  NORCO  Take 1 tablet by mouth every 6 (six) hours.     ipratropium 0.02 % nebulizer solution  Commonly known as:  ATROVENT  Take 0.5 mg by nebulization 2 (two) times daily.     metoprolol 50 MG tablet    Commonly known as:  LOPRESSOR  Take 25 mg by mouth 2 (two) times daily.     NITROSTAT 0.4 MG SL tablet  Generic drug:  nitroGLYCERIN  Place 0.4 mg under the tongue every 5 (five) minutes as needed for chest pain.     predniSONE 20 MG tablet  Commonly known as:  DELTASONE  Take 3 tablets by mouth at 6 PM and 10 PM night before procedure and at 8 AM day of procedure     verapamil 240 MG (CO) 24 hr tablet  Commonly known as:  COVERA HS  Take 240 mg by mouth at bedtime.     VITAMIN B 12 PO  Take 1 tablet by mouth daily.     zolpidem 10 MG tablet  Commonly known as:  AMBIEN  Take 10 mg by mouth at bedtime as needed for sleep.         Duration of Discharge Encounter   Greater than 30 minutes including physician time.  Hilbert Corrigan PA-C Pager: [phone] 10/12/2014, 10:37 AM

## 2014-10-12 NOTE — Progress Notes (Signed)
Patient Name: Joanne [name] Date of Encounter: 10/12/2014     Principal Problem:   Unstable angina Active Problems:   TOBACCO ABUSE   Essential hypertension   COPD (chronic obstructive pulmonary disease)    SUBJECTIVE  Denies any CP or SOB.   CURRENT MEDS . aspirin  81 mg Oral Daily  . budesonide-formoterol  2 puff Inhalation BID  . clopidogrel  75 mg Oral Q breakfast  . ipratropium-albuterol  3 mL Nebulization BID  . metoprolol  25 mg Oral BID  . pantoprazole  40 mg Oral Daily  . rosuvastatin  40 mg Oral Daily  . verapamil  240 mg Oral Daily    OBJECTIVE  Filed Vitals:   10/11/14 2002 10/11/14 2004 10/12/14 0015 10/12/14 0624  BP: 140/67  138/54 153/69  Pulse: 70  70 71  Temp: 98 F (36.7 C)  97.6 F (36.4 C) 97.2 F (36.2 C)  TempSrc: Oral  Oral Oral  Resp: 16  18 16   Height:      Weight:   184 lb 1.4 oz (83.5 kg)   SpO2: 94% 96% 99% 96%    Intake/Output Summary (Last 24 hours) at 10/12/14 0807 Last data filed at 10/12/14 7371  Gross per 24 hour  Intake   1073 ml  Output   1400 ml  Net   -327 ml   Filed Weights   10/11/14 0805 10/12/14 0015  Weight: 183 lb (83.008 kg) 184 lb 1.4 oz (83.5 kg)    PHYSICAL EXAM  General: Pleasant, NAD. Neuro: Alert and oriented X 3. Moves all extremities spontaneously. Psych: Normal affect. HEENT:  Normal  Neck: Supple without bruits or JVD. Lungs:  Resp regular and unlabored, CTA. Heart: RRR no s3, s4, or murmurs. Abdomen: Soft, non-tender, non-distended, BS + x 4.  Extremities: No clubbing, cyanosis or edema. DP/PT/Radials 2+ and equal bilaterally.  Accessory Clinical Findings  CBC  Recent Labs  10/09/14 1029  WBC 7.4  HGB 12.0  HCT 37.4  MCV 79.8  PLT 062.6   Basic Metabolic Panel  Recent Labs  10/09/14 1029  NA 138  K 4.3  CL 106  CO2 24  GLUCOSE 103*  BUN 18  CREATININE 1.2  CALCIUM 9.4    TELE NSR with HR 60-70s    ECG  NSR with RBBB, TWI in anterior lead  Echocardiogram  05/27/2012  LV EF: 60% -  65%  ------------------------------------------------------------ Indications:   Dyspnea 786.09.  ------------------------------------------------------------ History:  PMH: Acquired from the patient and from the patient's chart. Dyspnea, exertional dyspnea, and bilateral lower extremity edema. Atrial fibrillation. Coronary artery disease. Congestive heart failure. Chronic obstructive pulmonary disease. Risk factors: Hypertension. Dyslipidemia.  ------------------------------------------------------------ Study Conclusions  - Left ventricle: The cavity size was normal. There was mild focal basal hypertrophy of the septum. Systolic function was normal. The estimated ejection fraction was in the range of 60% to 65%. Wall motion was normal; there were no regional wall motion abnormalities. Doppler parameters are consistent with abnormal left ventricular relaxation (grade 1 diastolic dysfunction). - Aortic valve: Trivial regurgitation. - Mitral valve: Calcified annulus. Mild regurgitation. - Left atrium: The atrium was moderately dilated. - Right atrium: The atrium was mildly dilated. - Tricuspid valve: Mild-moderate regurgitation.    ASSESSMENT AND PLAN  1. Unstable angina  - Cath 10/11/2014 EF 50-55%,  patent SVG to OM2, occlueded SVG to OM1, atretic and nonfunctional LIMA to LAD, 90% ostial LAD lesion treated with PTCA/LAD  - likely discharge today once seen  by cardiac rehab  2. CAD s/p CABG in 2011 LIMA to LAD, SVG to OM2, SVG to OM1  - Cath 10/11/2014 EF 50-55%,  patent SVG to OM2, occlueded SVG to OM1, atretic and nonfunctional LIMA to LAD, 90% ostial LAD lesion treated with PTCA/LAD  3. Renal artery stenosis: follow by Dr. Donnetta Hutching of [facility] 4. RBBB 5. HTN 6. HLD 7. COPD 8. Former tobacco abuse: quit 6 yrs ago 35. H/o postop PAF  Signed, Almyra Deforest PA-C Pager: [phone]

## 2014-10-13 DIAGNOSIS — R072 Precordial pain: Secondary | ICD-10-CM | POA: Insufficient documentation

## 2014-11-24 ENCOUNTER — Ambulatory Visit: Payer: Commercial Managed Care - HMO | Admitting: Cardiovascular Disease

## 2015-01-25 ENCOUNTER — Telehealth: Payer: Self-pay

## 2015-01-25 NOTE — Telephone Encounter (Signed)
01/25/15 Disc received from [facility] and filed on shelf./IH °

## 2015-01-26 ENCOUNTER — Ambulatory Visit: Payer: Commercial Managed Care - HMO | Admitting: Cardiovascular Disease

## 2015-02-21 ENCOUNTER — Other Ambulatory Visit: Payer: Self-pay | Admitting: Cardiovascular Disease

## 2015-03-26 ENCOUNTER — Other Ambulatory Visit: Payer: Self-pay | Admitting: Physician Assistant

## 2015-03-27 NOTE — Telephone Encounter (Signed)
Refill sent for protonix

## 2015-03-27 NOTE — Telephone Encounter (Signed)
Please review for refill. Thanks!  

## 2015-03-27 NOTE — Telephone Encounter (Signed)
OK to refill but please include note stating pt needs to call to schedule appt.

## 2015-04-20 ENCOUNTER — Other Ambulatory Visit: Payer: Self-pay | Admitting: Cardiovascular Disease

## 2015-05-25 ENCOUNTER — Ambulatory Visit: Payer: Self-pay | Admitting: Emergency Medicine

## 2015-06-01 ENCOUNTER — Observation Stay (HOSPITAL_COMMUNITY)
Admission: EM | Admit: 2015-06-01 | Discharge: 2015-06-02 | Disposition: A | Discharge status: Home / Self Care | Payer: PPO | Attending: Internal Medicine | Admitting: Internal Medicine

## 2015-06-01 ENCOUNTER — Encounter (HOSPITAL_COMMUNITY): Payer: Self-pay | Admitting: Emergency Medicine

## 2015-06-01 DIAGNOSIS — Z7951 Long term (current) use of inhaled steroids: Secondary | ICD-10-CM | POA: Diagnosis not present

## 2015-06-01 DIAGNOSIS — Z7982 Long term (current) use of aspirin: Secondary | ICD-10-CM | POA: Diagnosis not present

## 2015-06-01 DIAGNOSIS — R55 Syncope and collapse: Principal | ICD-10-CM | POA: Insufficient documentation

## 2015-06-01 DIAGNOSIS — I2510 Atherosclerotic heart disease of native coronary artery without angina pectoris: Secondary | ICD-10-CM | POA: Insufficient documentation

## 2015-06-01 DIAGNOSIS — Z87891 Personal history of nicotine dependence: Secondary | ICD-10-CM | POA: Diagnosis not present

## 2015-06-01 DIAGNOSIS — K219 Gastro-esophageal reflux disease without esophagitis: Secondary | ICD-10-CM | POA: Diagnosis not present

## 2015-06-01 DIAGNOSIS — D649 Anemia, unspecified: Secondary | ICD-10-CM | POA: Diagnosis present

## 2015-06-01 DIAGNOSIS — Z79899 Other long term (current) drug therapy: Secondary | ICD-10-CM | POA: Insufficient documentation

## 2015-06-01 DIAGNOSIS — Z7952 Long term (current) use of systemic steroids: Secondary | ICD-10-CM | POA: Insufficient documentation

## 2015-06-01 DIAGNOSIS — Z951 Presence of aortocoronary bypass graft: Secondary | ICD-10-CM | POA: Diagnosis not present

## 2015-06-01 DIAGNOSIS — I252 Old myocardial infarction: Secondary | ICD-10-CM | POA: Insufficient documentation

## 2015-06-01 DIAGNOSIS — Z79891 Long term (current) use of opiate analgesic: Secondary | ICD-10-CM | POA: Diagnosis not present

## 2015-06-01 DIAGNOSIS — Z955 Presence of coronary angioplasty implant and graft: Secondary | ICD-10-CM | POA: Insufficient documentation

## 2015-06-01 DIAGNOSIS — I5032 Chronic diastolic (congestive) heart failure: Secondary | ICD-10-CM | POA: Insufficient documentation

## 2015-06-01 DIAGNOSIS — N183 Chronic kidney disease, stage 3 (moderate): Secondary | ICD-10-CM | POA: Insufficient documentation

## 2015-06-01 DIAGNOSIS — E785 Hyperlipidemia, unspecified: Secondary | ICD-10-CM | POA: Insufficient documentation

## 2015-06-01 DIAGNOSIS — I4891 Unspecified atrial fibrillation: Secondary | ICD-10-CM | POA: Insufficient documentation

## 2015-06-01 DIAGNOSIS — G454 Transient global amnesia: Secondary | ICD-10-CM

## 2015-06-01 DIAGNOSIS — Z853 Personal history of malignant neoplasm of breast: Secondary | ICD-10-CM | POA: Diagnosis not present

## 2015-06-01 DIAGNOSIS — M545 Low back pain: Secondary | ICD-10-CM | POA: Diagnosis not present

## 2015-06-01 DIAGNOSIS — G8929 Other chronic pain: Secondary | ICD-10-CM | POA: Insufficient documentation

## 2015-06-01 DIAGNOSIS — Z8679 Personal history of other diseases of the circulatory system: Secondary | ICD-10-CM

## 2015-06-01 DIAGNOSIS — I129 Hypertensive chronic kidney disease with stage 1 through stage 4 chronic kidney disease, or unspecified chronic kidney disease: Secondary | ICD-10-CM | POA: Insufficient documentation

## 2015-06-01 DIAGNOSIS — I10 Essential (primary) hypertension: Secondary | ICD-10-CM | POA: Diagnosis present

## 2015-06-01 DIAGNOSIS — J449 Chronic obstructive pulmonary disease, unspecified: Secondary | ICD-10-CM | POA: Insufficient documentation

## 2015-06-01 LAB — I-STAT TROPONIN, ED: Troponin i, poc: 0 ng/mL (ref 0.00–0.08)

## 2015-06-01 LAB — CBC WITH DIFFERENTIAL/PLATELET
Basophils Absolute: 0 10*3/uL (ref 0.0–0.1)
Basophils Relative: 0 % (ref 0–1)
EOS ABS: 0.4 10*3/uL (ref 0.0–0.7)
EOS PCT: 5 % (ref 0–5)
HEMATOCRIT: 33.6 % — AB (ref 36.0–46.0)
Hemoglobin: 9.6 g/dL — ABNORMAL LOW (ref 12.0–15.0)
Lymphocytes Relative: 17 % (ref 12–46)
Lymphs Abs: 1.3 10*3/uL (ref 0.7–4.0)
MCH: 22.1 pg — AB (ref 26.0–34.0)
MCHC: 28.6 g/dL — AB (ref 30.0–36.0)
MCV: 77.4 fL — ABNORMAL LOW (ref 78.0–100.0)
MONOS PCT: 15 % — AB (ref 3–12)
Monocytes Absolute: 1.1 10*3/uL — ABNORMAL HIGH (ref 0.1–1.0)
NEUTROS ABS: 4.8 10*3/uL (ref 1.7–7.7)
NEUTROS PCT: 63 % (ref 43–77)
PLATELETS: 190 10*3/uL (ref 150–400)
RBC: 4.34 MIL/uL (ref 3.87–5.11)
RDW: 16.8 % — AB (ref 11.5–15.5)
WBC: 7.6 10*3/uL (ref 4.0–10.5)

## 2015-06-01 LAB — BASIC METABOLIC PANEL
Anion gap: 11 (ref 5–15)
BUN: 29 mg/dL — ABNORMAL HIGH (ref 6–20)
CHLORIDE: 108 mmol/L (ref 101–111)
CO2: 23 mmol/L (ref 22–32)
CREATININE: 1.51 mg/dL — AB (ref 0.44–1.00)
Calcium: 9.1 mg/dL (ref 8.9–10.3)
GFR calc non Af Amer: 33 mL/min — ABNORMAL LOW (ref >60)
GFR, EST AFRICAN AMERICAN: 39 mL/min — AB (ref >60)
Glucose, Bld: 111 mg/dL — ABNORMAL HIGH (ref 65–99)
POTASSIUM: 4.7 mmol/L (ref 3.5–5.1)
Sodium: 142 mmol/L (ref 135–145)
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
## 2015-06-02 ENCOUNTER — Observation Stay (HOSPITAL_COMMUNITY): Payer: PPO

## 2015-06-02 ENCOUNTER — Observation Stay (HOSPITAL_BASED_OUTPATIENT_CLINIC_OR_DEPARTMENT_OTHER): Payer: PPO

## 2015-06-02 ENCOUNTER — Encounter (HOSPITAL_COMMUNITY): Payer: Self-pay | Admitting: Internal Medicine

## 2015-06-02 DIAGNOSIS — D649 Anemia, unspecified: Secondary | ICD-10-CM | POA: Diagnosis not present

## 2015-06-02 DIAGNOSIS — R55 Syncope and collapse: Secondary | ICD-10-CM

## 2015-06-02 DIAGNOSIS — I10 Essential (primary) hypertension: Secondary | ICD-10-CM

## 2015-06-02 LAB — COMPREHENSIVE METABOLIC PANEL
ALK PHOS: 63 U/L (ref 38–126)
ALT: 19 U/L (ref 14–54)
AST: 21 U/L (ref 15–41)
Albumin: 3.5 g/dL (ref 3.5–5.0)
Anion gap: 12 (ref 5–15)
BUN: 26 mg/dL — ABNORMAL HIGH (ref 6–20)
CO2: 22 mmol/L (ref 22–32)
CREATININE: 1.23 mg/dL — AB (ref 0.44–1.00)
Calcium: 9.3 mg/dL (ref 8.9–10.3)
Chloride: 107 mmol/L (ref 101–111)
GFR, EST AFRICAN AMERICAN: 50 mL/min — AB (ref >60)
GFR, EST NON AFRICAN AMERICAN: 43 mL/min — AB (ref >60)
Glucose, Bld: 125 mg/dL — ABNORMAL HIGH (ref 65–99)
Potassium: 4.3 mmol/L (ref 3.5–5.1)
Sodium: 141 mmol/L (ref 135–145)
Total Bilirubin: 0.3 mg/dL (ref 0.3–1.2)
Total Protein: 6.2 g/dL — ABNORMAL LOW (ref 6.5–8.1)

## 2015-06-02 LAB — CBC
HEMATOCRIT: 33.4 % — AB (ref 36.0–46.0)
Hemoglobin: 9.5 g/dL — ABNORMAL LOW (ref 12.0–15.0)
MCH: 21.6 pg — AB (ref 26.0–34.0)
MCHC: 28.4 g/dL — ABNORMAL LOW (ref 30.0–36.0)
MCV: 75.9 fL — ABNORMAL LOW (ref 78.0–100.0)
PLATELETS: 177 10*3/uL (ref 150–400)
RBC: 4.4 MIL/uL (ref 3.87–5.11)
RDW: 16.7 % — AB (ref 11.5–15.5)
WBC: 9 10*3/uL (ref 4.0–10.5)

## 2015-06-02 LAB — RAPID URINE DRUG SCREEN, HOSP PERFORMED
AMPHETAMINES: NOT DETECTED
BARBITURATES: NOT DETECTED
Benzodiazepines: NOT DETECTED
COCAINE: NOT DETECTED
Opiates: POSITIVE — AB
Tetrahydrocannabinol: NOT DETECTED

## 2015-06-02 LAB — CREATININE, SERUM
Creatinine, Ser: 1.23 mg/dL — ABNORMAL HIGH (ref 0.44–1.00)
GFR calc Af Amer: 50 mL/min — ABNORMAL LOW (ref >60)
GFR, EST NON AFRICAN AMERICAN: 43 mL/min — AB (ref >60)

## 2015-06-02 LAB — POC OCCULT BLOOD, ED: FECAL OCCULT BLD: NEGATIVE

## 2015-06-02 LAB — TROPONIN I

## 2015-06-02 LAB — AMMONIA: Ammonia: 18 umol/L (ref 9–35)

## 2015-06-02 MED ORDER — METOPROLOL TARTRATE 50 MG PO TABS
50.0000 mg | ORAL_TABLET | Freq: Two times a day (BID) | ORAL | Status: DC
  Administered 2015-06-02: 50 mg via ORAL
  Filled 2015-06-02 (×2): qty 1

## 2015-06-02 MED ORDER — BUDESONIDE-FORMOTEROL FUMARATE 160-4.5 MCG/ACT IN AERO
2.0000 | INHALATION_SPRAY | Freq: Two times a day (BID) | RESPIRATORY_TRACT | Status: DC
  Filled 2015-06-02: qty 6

## 2015-06-02 MED ORDER — FERROUS SULFATE 325 (65 FE) MG PO TABS: 325.0000 mg | ORAL_TABLET | Freq: Two times a day (BID) | ORAL | Status: AC

## 2015-06-02 MED ORDER — ALBUTEROL SULFATE (2.5 MG/3ML) 0.083% IN NEBU: 3.0000 mL | INHALATION_SOLUTION | Freq: Four times a day (QID) | RESPIRATORY_TRACT | Status: DC | PRN

## 2015-06-02 MED ORDER — ASPIRIN EC 81 MG PO TBEC
81.0000 mg | DELAYED_RELEASE_TABLET | Freq: Every day | ORAL | Status: DC
  Administered 2015-06-02: 81 mg via ORAL
  Filled 2015-06-02: qty 1

## 2015-06-02 MED ORDER — ROSUVASTATIN CALCIUM 40 MG PO TABS
40.0000 mg | ORAL_TABLET | Freq: Every day | ORAL | Status: DC
  Administered 2015-06-02: 40 mg via ORAL
  Filled 2015-06-02: qty 1

## 2015-06-02 MED ORDER — SODIUM CHLORIDE 0.9 % IV SOLN
INTRAVENOUS | Status: DC
  Administered 2015-06-02: 04:00:00 via INTRAVENOUS

## 2015-06-02 MED ORDER — HYDROCODONE-ACETAMINOPHEN 10-325 MG PO TABS: 1.0000 | ORAL_TABLET | Freq: Four times a day (QID) | ORAL | Status: DC | PRN

## 2015-06-02 MED ORDER — PANTOPRAZOLE SODIUM 40 MG PO TBEC
40.0000 mg | DELAYED_RELEASE_TABLET | Freq: Every day | ORAL | Status: DC
Start: 2015-06-02 — End: 2015-06-02
  Administered 2015-06-02: 40 mg via ORAL
  Filled 2015-06-02: qty 1

## 2015-06-02 MED ORDER — ENOXAPARIN SODIUM 40 MG/0.4ML ~~LOC~~ SOLN
40.0000 mg | SUBCUTANEOUS | Status: DC
  Administered 2015-06-02: 40 mg via SUBCUTANEOUS
  Filled 2015-06-02 (×2): qty 0.4

## 2015-06-02 MED ORDER — ALPRAZOLAM 0.25 MG PO TABS: 0.2500 mg | ORAL_TABLET | Freq: Every evening | ORAL | Status: DC | PRN

## 2015-06-02 MED ORDER — VERAPAMIL HCL ER 240 MG PO TBCR
240.0000 mg | EXTENDED_RELEASE_TABLET | Freq: Every day | ORAL | Status: DC
  Administered 2015-06-02: 240 mg via ORAL
  Filled 2015-06-02 (×2): qty 1

## 2015-06-02 MED ORDER — IPRATROPIUM BROMIDE 0.02 % IN SOLN: 0.5000 mg | Freq: Two times a day (BID) | RESPIRATORY_TRACT | Status: DC

## 2015-06-02 MED ORDER — ZOLPIDEM TARTRATE 5 MG PO TABS: 5.0000 mg | ORAL_TABLET | Freq: Every evening | ORAL | Status: DC | PRN

## 2015-06-02 MED ORDER — ALBUTEROL SULFATE (2.5 MG/3ML) 0.083% IN NEBU
2.5000 mg | INHALATION_SOLUTION | Freq: Two times a day (BID) | RESPIRATORY_TRACT | Status: DC
  Administered 2015-06-02: 2.5 mg via RESPIRATORY_TRACT
  Filled 2015-06-02: qty 3

## 2015-06-02 MED ORDER — IPRATROPIUM-ALBUTEROL 0.5-2.5 (3) MG/3ML IN SOLN
3.0000 mL | Freq: Two times a day (BID) | RESPIRATORY_TRACT | Status: DC
  Administered 2015-06-02: 3 mL via RESPIRATORY_TRACT
  Filled 2015-06-02 (×2): qty 3

## 2015-06-02 MED ORDER — HYDROCODONE-ACETAMINOPHEN 10-325 MG PO TABS: 1.0000 | ORAL_TABLET | Freq: Three times a day (TID) | ORAL | Status: DC | PRN

## 2015-06-02 MED ORDER — NITROGLYCERIN 0.4 MG SL SUBL: 0.4000 mg | SUBLINGUAL_TABLET | SUBLINGUAL | Status: DC | PRN

## 2015-06-02 MED ORDER — CLOPIDOGREL BISULFATE 75 MG PO TABS
75.0000 mg | ORAL_TABLET | Freq: Every day | ORAL | Status: DC
  Administered 2015-06-02: 75 mg via ORAL
  Filled 2015-06-02 (×2): qty 1

## 2015-06-02 NOTE — H&P (Signed)
[facility] History and Physical  Joanne [name] ZOX:[id number] DOB: 09-17-1941 DOA: 06/01/2015  Referring physician: Dr. Lavena Bullion. PCP: Donnajean Lopes, MD  Specialists: Dr. Traci Sermon. Cardiologist.  Chief Complaint: Loss of consciousness.  HPI: Joanne [name] is a 74 y.o. female with history of CAD status post CABG and stenting, COPD, hypertension and chronic kidney disease and chronic anemia presents to the ER after patient had a transient episode where patient may have loss consciousness. Patient states that she was driving her car and suddenly found herself driving on the field not knowing how she reached there. She has not had any incontinence of urine tongue bite. Hasn't been having some headaches over the last few weeks. Denies any new medications. Patient was brought to the ER and on exam patient is nonfocal. EKG shows normal sinus rhythm with RBBB. Patient denies any chest pain shortness of breath nausea vomiting abdominal pain or diarrhea. Patient will be admitted for further observation.   Review of Systems: As presented in the history of presenting illness, rest negative.  Past Medical History  Diagnosis Date  . Left renal artery stenosis     a. Moderate L RAS  . Hyperlipidemia   . Hypertension   . COPD (chronic obstructive pulmonary disease)   . Atrial fibrillation   . Coronary artery disease     a. 11/2009 CABGx3: LIMA->LAD, VG->RCA, VG->OM2. b. Cath 10/11/2014 EF 50-55%,  patent SVG to OM2, occlueded SVG to OM1, atretic and nonfunctional LIMA to LAD, 90% ostial LAD lesion treated with PTCA/LAD  . Chronic diastolic CHF (congestive heart failure)     a. 08/2010 Echo: EF 65-70%, Gr 1 DD, Mild MR  . Scoliosis of thoracic spine   . Breast cancer 1997    "left"  . Heart murmur   . Myocardial infarction 2011  . History of blood transfusion 11/2009    "related to OHS"   . GERD (gastroesophageal reflux disease)   . Arthritis     "all over"  . Chronic lower back pain    Past  Surgical History  Procedure Laterality Date  . Cardiac catheterization  11/2009  . Coronary angioplasty with stent placement  10/11/2014    "1"  . Tonsillectomy  1973  . Appendectomy  1970's  . Abdominal hysterectomy  1970's  . Laparoscopic cholecystectomy  2001  . Breast biopsy Left 1997  . Breast lumpectomy Left 1997  . Tubal ligation  1982?  Marland Kitchen Coronary artery bypass graft  12/21/2009    CABGX3  Dr. Prescott Gum  . Left heart catheterization with coronary angiogram N/A 10/11/2014    Procedure: LEFT HEART CATHETERIZATION WITH CORONARY ANGIOGRAM;  Surgeon: Burnell Blanks, MD;  Location: [facility] CATH LAB;  Service: Cardiovascular;  Laterality: N/A;   Social History:  reports that she quit smoking about 5 years ago. Her smoking use included Cigarettes. She has a 100 pack-year smoking history. She has never used smokeless tobacco. She reports that she does not drink alcohol or use illicit drugs. Where does patient live at home. Can patient participate in ADLs? Yes.  Allergies  Allergen Reactions  . Iohexol      Code: SOB, Desc: ct @ ser w/ throat swelling & sob, ok w/ 13 hr prep//a.c., Onset Date: [id number]     Family History:  Family History  Problem Relation Age of Onset  . Heart failure Mother   . Heart disease Mother     Before age 65  . Diabetes Mother   . Hyperlipidemia Mother   .  Hypertension Mother   . COPD Father   . Heart failure Grandchild   . Heart attack Brother   . Diabetes Brother   . Heart disease Brother     Before age 42  . Hyperlipidemia Brother   . Hypertension Brother   . Cancer Sister     small      Prior to Admission medications   Medication Sig Start Date End Date Taking? Authorizing Provider  albuterol (PROVENTIL HFA;VENTOLIN HFA) 108 (90 BASE) MCG/ACT inhaler Inhale 1 puff into the lungs every 6 (six) hours as needed for wheezing or shortness of breath.   Yes Historical Provider, MD  albuterol (PROVENTIL) (2.5 MG/3ML) 0.083% nebulizer solution  Take 2.5 mg by nebulization 2 (two) times daily.   Yes Historical Provider, MD  ALPRAZolam (XANAX) 0.25 MG tablet Take 0.25 mg by mouth daily as needed. 04/27/15  Yes Historical Provider, MD  aspirin EC 81 MG tablet Take 81 mg by mouth daily.   Yes Historical Provider, MD  budesonide-formoterol (SYMBICORT) 160-4.5 MCG/ACT inhaler Inhale 2 puffs into the lungs 2 (two) times daily.   Yes Historical Provider, MD  clopidogrel (PLAVIX) 75 MG tablet Take 1 tablet (75 mg total) by mouth daily with breakfast. 10/12/14  Yes Almyra Deforest, PA  CRESTOR 40 MG tablet TAKE 1 TABLET BY MOUTH DAILY 06/20/14  Yes Burnell Blanks, MD  Cyanocobalamin (VITAMIN B 12 PO) Take 1 tablet by mouth daily.   Yes Historical Provider, MD  ipratropium (ATROVENT) 0.02 % nebulizer solution Take 0.5 mg by nebulization 2 (two) times daily.  04/08/13  Yes Historical Provider, MD  metoprolol (LOPRESSOR) 50 MG tablet TAKE 1 TABLET (50 MG TOTAL) BY MOUTH 2 (TWO) TIMES DAILY. 02/22/15  Yes Burnell Blanks, MD  NITROSTAT 0.4 MG SL tablet Place 0.4 mg under the tongue every 5 (five) minutes as needed for chest pain.  07/05/14  Yes Historical Provider, MD  pantoprazole (PROTONIX) 40 MG tablet TAKE 1 TABLET EVERY DAY 04/20/15  Yes Burnell Blanks, MD  verapamil (COVERA HS) 240 MG (CO) 24 hr tablet Take 240 mg by mouth at bedtime.     Yes Historical Provider, MD  zolpidem (AMBIEN) 10 MG tablet Take 10 mg by mouth at bedtime as needed for sleep.  04/26/12  Yes Historical Provider, MD  HYDROcodone-acetaminophen (NORCO) 10-325 MG per tablet Take 1 tablet by mouth every 6 (six) hours.  09/24/14   Historical Provider, MD  predniSONE (DELTASONE) 20 MG tablet Take 3 tablets by mouth at 6 PM and 10 PM night before procedure and at 8 AM day of procedure Patient not taking: Reported on 06/01/2015 09/27/14   Burnell Blanks, MD    Physical Exam: Filed Vitals:   06/01/15 2245 06/01/15 2330 06/01/15 2345 06/01/15 2354  BP: 169/91 154/70  173/59   Pulse: 65 69 74   Temp:    98.3 F (36.8 C)  TempSrc:      Resp:  14 17   Height:      Weight:      SpO2: 97% 98% 98%      General:  Moderately built and nourished.  Eyes: Anicteric no pallor.  ENT: No discharge from the ears eyes nose and mouth.  Neck: No mass felt. No neck rigidity.  Cardiovascular: S1-S2 heard.  Respiratory: No rhonchi or crepitations.  Abdomen: Soft nontender bowel sounds present.  Skin: No rash.  Musculoskeletal: No edema.  Psychiatric: Appears normal.  Neurologic: Alert awake oriented to time place and  person. Moves all extremities.  Labs on Admission:  Basic Metabolic Panel:  Recent Labs Lab 06/01/15 1903  NA 142  K 4.7  CL 108  CO2 23  GLUCOSE 111*  BUN 29*  CREATININE 1.51*  CALCIUM 9.1   Liver Function Tests: No results for input(s): AST, ALT, ALKPHOS, BILITOT, PROT, ALBUMIN in the last 168 hours. No results for input(s): LIPASE, AMYLASE in the last 168 hours. No results for input(s): AMMONIA in the last 168 hours. CBC:  Recent Labs Lab 06/01/15 1903  WBC 7.6  NEUTROABS 4.8  HGB 9.6*  HCT 33.6*  MCV 77.4*  PLT 190   Cardiac Enzymes: No results for input(s): CKTOTAL, CKMB, CKMBINDEX, TROPONINI in the last 168 hours.  BNP (last 3 results) No results for input(s): BNP in the last 8760 hours.  ProBNP (last 3 results) No results for input(s): PROBNP in the last 8760 hours.  CBG: No results for input(s): GLUCAP in the last 168 hours.  Radiological Exams on Admission: No results found.  EKG: Independently reviewed. Normal sinus rhythm with RBBB.  Assessment/Plan Principal Problem:   Syncope Active Problems:   Essential hypertension   COPD (chronic obstructive pulmonary disease)   Chronic anemia   1. Syncope versus transient global amnesia - patient at this time appears nonfocal. MRI brain EEG and chest x-ray have been ordered and are pending. Closely observe in telemetry. 2. CAD status post CABG  status post stenting - patient standing was recently done in December 2015. Denies any chest pain. Continue antiplatelets agents statins and beta blockers. 3. COPD proceeding on wheezing. 4. Chronic anemia - stool for occult blood has been negative. Follow CBC. Further workup as outpatient. 5. Chronic kidney disease stage III - follow metabolic panel. 6. History of breast cancer status post lumpectomy.  I have reviewed patient's old charts and labs. I have personally reviewed patient's EKG.   DVT Prophylaxis Lovenox.  Code Status: Full code.  Family Communication: Patient's son.  Disposition Plan: Admit for observation.    KAKRAKANDY,ARSHAD N. [facility] Pager [phone].  If 7PM-7AM, please contact night-coverage [email or web address] Password [email or web address] 06/02/2015, 12:27 AM

## 2015-06-02 NOTE — ED Notes (Signed)
Patient transported to MRI 

## 2015-06-02 NOTE — Progress Notes (Signed)
EEG completed, results pending. 

## 2015-06-02 NOTE — Discharge Summary (Signed)
Physician Discharge Summary  Joanne [name] HFW:[id number] DOB: February 23, 1941 DOA: 06/01/2015  PCP: Donnajean Lopes, MD  Admit date: 06/01/2015 Discharge date: 06/02/2015  Time spent: 35 minutes  Recommendations for Outpatient Follow-up:  1. Needs MRA head to evaluate for aneurysmal.  2. Needs to follow up with cardiology , further work up for syncope as needed.  3. Patient advised to stop Ambien. Needs to be careful with opioids.  4. Needs outpatient colonoscopy and anemia panel.   Discharge Diagnoses:    Syncope   Essential hypertension   COPD (chronic obstructive pulmonary disease)   Chronic anemia   Discharge Condition: stable.   Diet recommendation: heart healthy  Filed Weights   06/01/15 1848 06/02/15 0121  Weight: 79.969 kg (176 lb 4.8 oz) 80.105 kg (176 lb 9.6 oz)    History of present illness:  Joanne [name] is a 74 y.o. female with history of CAD status post CABG and stenting, COPD, hypertension and chronic kidney disease and chronic anemia presents to the ER after patient had a transient episode where patient may have loss consciousness. Patient states that she was driving her car and suddenly found herself driving on the field not knowing how she reached there. She has not had any incontinence of urine tongue bite. Hasn't been having some headaches over the last few weeks. Denies any new medications. Patient was brought to the ER and on exam patient is nonfocal. EKG shows normal sinus rhythm with RBBB. Patient denies any chest pain shortness of breath nausea vomiting abdominal pain or diarrhea. Patient will be admitted for further observation.   Hospital Course:  1-Syncope; MRI negative for stroke, EEG negative for seizure. No arrhythmia on telemetry. Troponin negative. ECHO normal EF, no aortic valve stenosis.. Neurology consulted. Plan to continue with aspirin, plavix. Events might be related to Ambien and or pain medications. Patient was advised not to drive.   Incidental  Aneurismal left carotid terminus:  Aneurysmal LEFT carotid terminus for which follow-up MRA of the head is recommended on a nonemergent basis.  Anemia;  Discharge on iron supplement. Denies melena, blood in the stool. Needs outpatient colonoscopy. stool for occult blood has been negative. Needs anemia panel.   CAD status post CABG status post stenting - patient standing was recently done in December 2015. Denies any chest pain. Continue antiplatelets agents statins and beta blockers.  Procedures:  ECHO; - Left ventricle: The cavity size was normal. There was mild concentric hypertrophy. Systolic function was vigorous. The estimated ejection fraction was in the range of 65% to 70%. Wall motion was normal; there were no regional wall motion abnormalities. Features are consistent with a pseudonormal left ventricular filling pattern, with concomitant abnormal relaxation and increased filling pressure (grade 2 diastolic dysfunction). - Aortic valve: There was trivial regurgitation. - Mitral valve: Moderately to severely calcified annulus. There was mild regurgitation. - Left atrium: The atrium was mildly dilated. - Right atrium: The atrium was mildly dilated. - Tricuspid valve: There was moderate regurgitation. - Pulmonary arteries: Systolic pressure was moderately increased. PA peak pressure: 52 mm Hg (S).  EEG; negative  Consultations:  neurology  Discharge Exam: Filed Vitals:   06/02/15 1513  BP: 128/64  Pulse: 61  Temp: 97.9 F (36.6 C)  Resp: 20    General: Alert in no distress.  Cardiovascular: S 1, S 2 RRR Respiratory: CTA  Discharge Instructions   Discharge Instructions    Diet - low sodium heart healthy    Complete by:  As directed  Increase activity slowly    Complete by:  As directed           Current Discharge Medication List    START taking these medications   Details  ferrous sulfate 325 (65 FE) MG tablet Take 1 tablet (325 mg  total) by mouth 2 (two) times daily with a meal. Qty: 60 tablet, Refills: 3      CONTINUE these medications which have CHANGED   Details  HYDROcodone-acetaminophen (NORCO) 10-325 MG per tablet Take 1 tablet by mouth every 8 (eight) hours as needed. Qty: 30 tablet, Refills: 0      CONTINUE these medications which have NOT CHANGED   Details  albuterol (PROVENTIL HFA;VENTOLIN HFA) 108 (90 BASE) MCG/ACT inhaler Inhale 1 puff into the lungs every 6 (six) hours as needed for wheezing or shortness of breath.    albuterol (PROVENTIL) (2.5 MG/3ML) 0.083% nebulizer solution Take 2.5 mg by nebulization 2 (two) times daily.    ALPRAZolam (XANAX) 0.25 MG tablet Take 0.25 mg by mouth daily as needed. Refills: 3    aspirin EC 81 MG tablet Take 81 mg by mouth daily.    budesonide-formoterol (SYMBICORT) 160-4.5 MCG/ACT inhaler Inhale 2 puffs into the lungs 2 (two) times daily.    clopidogrel (PLAVIX) 75 MG tablet Take 1 tablet (75 mg total) by mouth daily with breakfast. Qty: 30 tablet, Refills: 11    CRESTOR 40 MG tablet TAKE 1 TABLET BY MOUTH DAILY Qty: 30 tablet, Refills: 0    Cyanocobalamin (VITAMIN B 12 PO) Take 1 tablet by mouth daily.    ipratropium (ATROVENT) 0.02 % nebulizer solution Take 0.5 mg by nebulization 2 (two) times daily.     metoprolol (LOPRESSOR) 50 MG tablet TAKE 1 TABLET (50 MG TOTAL) BY MOUTH 2 (TWO) TIMES DAILY. Qty: 60 tablet, Refills: 3    NITROSTAT 0.4 MG SL tablet Place 0.4 mg under the tongue every 5 (five) minutes as needed for chest pain.  Refills: 12    pantoprazole (PROTONIX) 40 MG tablet TAKE 1 TABLET EVERY DAY Qty: 30 tablet, Refills: 3    verapamil (COVERA HS) 240 MG (CO) 24 hr tablet Take 240 mg by mouth at bedtime.        STOP taking these medications     zolpidem (AMBIEN) 10 MG tablet      predniSONE (DELTASONE) 20 MG tablet        Allergies  Allergen Reactions  . Iohexol      Code: SOB, Desc: ct @ ser w/ throat swelling & sob, ok w/ 13  hr prep//a.c., Onset Date: [id number]    Follow-up Information    Follow up with Donnajean Lopes, MD In 1 week.   Specialty:  Internal Medicine   Contact information:   [address] [place] [address] [phone]       Follow up with Lauree Chandler, MD In 1 week.   Specialty:  Cardiology   Contact information:   [address] [place] [address] [phone]        The results of significant diagnostics from this hospitalization (including imaging, microbiology, ancillary and laboratory) are listed below for reference.    Significant Diagnostic Studies: Dg Chest 2 View  06/02/2015   CLINICAL DATA:  Acute onset of syncope while driving. Initial encounter.  EXAM: CHEST  2 VIEW  COMPARISON:  Chest radiograph performed 02/27/2010  FINDINGS: The lungs are well-aerated. Vascular congestion is noted. Mild bibasilar atelectasis is noted. There is no evidence of  pleural effusion or pneumothorax.  The heart is mildly enlarged. The patient is status post median sternotomy. No acute osseous abnormalities are seen. Clips are seen overlying the left axilla.  IMPRESSION: Vascular congestion and mild cardiomegaly. Mild bibasilar atelectasis noted.   Electronically Signed   By: Garald Balding M.D.   On: 06/02/2015 00:46   Mr Brain Wo Contrast  06/02/2015   CLINICAL DATA:  Syncopal episode earlier this evening while driving. Transient global amnesia. History of hypertension, hyperlipidemia, CHF, breast cancer.  EXAM: MRI HEAD WITHOUT CONTRAST  TECHNIQUE: Multiplanar, multiecho pulse sequences of the brain and surrounding structures were obtained without intravenous contrast.  COMPARISON:  None.  FINDINGS: The ventricles and sulci are normal for patient's age. No suspicious parenchymal signal, mass lesions, mass effect. Patchy to confluent supratentorial white matter FLAIR T2 hyperintense signal. No reduced diffusion to suggest acute ischemia. No susceptibility artifact to  suggest hemorrhage.  No abnormal extra-axial fluid collections. No extra-axial masses though, contrast enhanced sequences would be more sensitive. Normal major intracranial vascular flow voids seen at the skull base. Aneurysmal LEFT carotid terminus.  Ocular globes and orbital contents are unremarkable though not tailored for evaluation. No abnormal sellar expansion. Visualized paranasal sinuses and mastoid air cells are well-aerated. No suspicious calvarial bone marrow signal. No abnormal sellar expansion. Craniocervical junction maintained. Patient is edentulous.  IMPRESSION: No acute intracranial process.  Moderate to severe white matter changes compatible with chronic small vessel ischemic disease.  Aneurysmal LEFT carotid terminus for which follow-up MRA of the head is recommended on a nonemergent basis.   Electronically Signed   By: Elon Alas M.D.   On: 06/02/2015 01:44    Microbiology: No results found for this or any previous visit (from the past 240 hour(s)).   Labs: Basic Metabolic Panel:  Recent Labs Lab 06/01/15 1903 06/02/15 0302  NA 142 141  K 4.7 4.3  CL 108 107  CO2 23 22  GLUCOSE 111* 125*  BUN 29* 26*  CREATININE 1.51* 1.23*  1.23*  CALCIUM 9.1 9.3   Liver Function Tests:  Recent Labs Lab 06/02/15 0302  AST 21  ALT 19  ALKPHOS 63  BILITOT 0.3  PROT 6.2*  ALBUMIN 3.5   No results for input(s): LIPASE, AMYLASE in the last 168 hours.  Recent Labs Lab 06/02/15 0300  AMMONIA 18   CBC:  Recent Labs Lab 06/01/15 1903 06/02/15 0302  WBC 7.6 9.0  NEUTROABS 4.8  --   HGB 9.6* 9.5*  HCT 33.6* 33.4*  MCV 77.4* 75.9*  PLT 190 177   Cardiac Enzymes:  Recent Labs Lab 06/02/15 1340  TROPONINI <0.03   BNP: BNP (last 3 results) No results for input(s): BNP in the last 8760 hours.  ProBNP (last 3 results) No results for input(s): PROBNP in the last 8760 hours.  CBG: No results for input(s): GLUCAP in the last 168  hours.     Signed:  Niel Hummer A  [facility] 06/02/2015, 4:12 PM

## 2015-06-02 NOTE — Progress Notes (Signed)
Initial Nutrition Assessment  DOCUMENTATION CODES:  Obesity unspecified  INTERVENTION:  Magic cup BID with meals, each supplement provides 290 kcal and 9 grams of protein  Recommend MVI  NUTRITION DIAGNOSIS:  Inadequate oral intake related to poor appetite as evidenced by reportedly eating only 1 meal a day for the past few months  GOAL:  Patient will meet greater than or equal to 90% of their needs  MONITOR:  PO intake, Supplement acceptance, Labs, I & O's  REASON FOR ASSESSMENT:  Malnutrition Screening Tool    ASSESSMENT:  74 y.o. female PMHx Copd, Ckd, chronic anemia, GERD, Breast Cancer, A-Fib, HTN, HLD, CHF, and CAD status post CABG/ stenting  presents to the ER  transient episode where patient may have loss consciousness  Pt reports that she has not been eating well the past few month due to her husband being in hospice and recent death. She has been eating about 1 meal a day. She denies following any sort or diet and did not take any vitamins/supplements at home. She also denies n/v/c/d.  She state her "Normal weight" is 190 lbs which she states she weighed after her surgery in December. Per epic documentation, it appears she weighed slightly less than that at that time: 183-184 lbs.   I offered her multiple supplements for extra protein. She said she would like the [employer]  Diet Order:  Diet Heart Room service appropriate?: Yes; Fluid consistency:: Thin  Skin:  Reviewed, no issues  Last BM:  Unknown  Height:  Ht Readings from Last 1 Encounters:  06/02/15 5\' 3"  (1.6 m)   Weight:  Wt Readings from Last 1 Encounters:  06/02/15 176 lb 9.6 oz (80.105 kg)   Wt Readings from Last 10 Encounters:  06/02/15 176 lb 9.6 oz (80.105 kg)  10/12/14 184 lb 1.4 oz (83.5 kg)  09/25/14 182 lb 12.8 oz (82.918 kg)  07/13/14 187 lb (84.823 kg)  02/07/14 188 lb (85.276 kg)  05/31/13 185 lb (83.915 kg)  05/25/13 186 lb (84.369 kg)  10/05/12 192 lb (87.091 kg)  08/27/12 190 lb  9.6 oz (86.456 kg)  08/06/12 186 lb (84.369 kg)    Ideal Body Weight:  52.3 kg  BMI:  Body mass index is 31.29 kg/(m^2).  Estimated Nutritional Needs:  Kcal:  1500-1650 (19-21 kcal/kg) Protein:  63-73 g (1.2-1.4 g/kg IBW) Fluid:  1.5-1.7 liters  EDUCATION NEEDS:  No education needs identified at this time  Burtis Junes RD, LDN Nutrition Pager: [phone] 06/02/2015 10:53 AM
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
## 2015-06-02 NOTE — ED Notes (Signed)
Patient transported to X-ray 

## 2015-06-02 NOTE — Procedures (Signed)
ELECTROENCEPHALOGRAM REPORT   Patient: Joanne [name]       Room #: 3E-08 Age: 74 y.o.        Sex: female Referring Physician: Dr Tyrell Antonio Report Date:  06/02/2015        Interpreting Physician: Hulen Luster  History: Joanne [name] is an 74 y.o. female admitted with transient episode of loss of consciousness  Medications:  Scheduled: . aspirin EC  81 mg Oral Daily  . budesonide-formoterol  2 puff Inhalation BID  . clopidogrel  75 mg Oral Q breakfast  . enoxaparin (LOVENOX) injection  40 mg Subcutaneous Q24H  . ipratropium-albuterol  3 mL Nebulization BID  . metoprolol  50 mg Oral BID  . pantoprazole  40 mg Oral Daily  . rosuvastatin  40 mg Oral Daily  . verapamil  240 mg Oral QHS    Conditions of Recording:  This is a 19 channel EEG carried out with the patient in the awake state.  Description:  The waking background activity consists of a low voltage, symmetrical, fairly well organized, 10-11 Hz alpha activity, seen from the parieto-occipital and posterior temporal regions.  Low voltage fast activity, poorly organized, is seen anteriorly and is at times superimposed on more posterior regions. No focal slowing or epileptiform activity is noted.   The patient does not drowse or sleep. Hyperventilation was not performed. Intermittent photic stimulation was performed but failed to illicit any change in the tracing.    IMPRESSION: Normal electroencephalogram. There are no focal lateralizing or epileptiform features.   Jim Like, DO [facility] [phone]  If 7pm- 7am, please page neurology on call as listed in AMION. 06/02/2015, 11:52 AM

## 2015-06-02 NOTE — ED Notes (Signed)
Pt returned from xray and

## 2015-06-08 ENCOUNTER — Ambulatory Visit: Payer: PPO | Admitting: Cardiology
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
## 2015-06-11 ENCOUNTER — Encounter: Payer: PPO | Admitting: Physician Assistant

## 2015-07-17 ENCOUNTER — Encounter: Payer: Self-pay | Admitting: Family

## 2015-07-19 ENCOUNTER — Ambulatory Visit: Payer: Commercial Managed Care - HMO | Admitting: Family

## 2015-07-19 ENCOUNTER — Inpatient Hospital Stay (HOSPITAL_COMMUNITY): Admission: RE | Admit: 2015-07-19 | Payer: PPO | Source: Ambulatory Visit

## 2015-08-03 ENCOUNTER — Other Ambulatory Visit: Payer: Self-pay | Admitting: Cardiovascular Disease

## 2015-09-01 ENCOUNTER — Other Ambulatory Visit: Payer: Self-pay | Admitting: Cardiovascular Disease

## 2015-11-05 ENCOUNTER — Ambulatory Visit: Payer: PPO | Admitting: Cardiovascular Disease

## 2015-11-15 ENCOUNTER — Encounter: Payer: PPO | Admitting: Cardiovascular Disease

## 2015-11-15 NOTE — Progress Notes (Signed)
cancel

## 2015-11-23 ENCOUNTER — Other Ambulatory Visit: Payer: Self-pay | Admitting: Cardiovascular Disease

## 2016-01-11 ENCOUNTER — Other Ambulatory Visit: Payer: Self-pay | Admitting: *Deleted

## 2016-01-11 DIAGNOSIS — I701 Atherosclerosis of renal artery: Secondary | ICD-10-CM

## 2016-01-14 ENCOUNTER — Encounter: Payer: Self-pay | Admitting: Family

## 2016-01-18 ENCOUNTER — Ambulatory Visit: Payer: PPO | Admitting: Family

## 2016-01-18 ENCOUNTER — Encounter (HOSPITAL_COMMUNITY): Payer: PPO

## 2016-01-30 ENCOUNTER — Encounter: Payer: Self-pay | Admitting: Cardiovascular Disease

## 2016-01-30 ENCOUNTER — Ambulatory Visit (INDEPENDENT_AMBULATORY_CARE_PROVIDER_SITE_OTHER): Payer: Commercial Managed Care - HMO | Admitting: Cardiovascular Disease

## 2016-01-30 VITALS — Ht 63.0 in | Wt 171.1 lb

## 2016-01-30 DIAGNOSIS — I25119 Atherosclerotic heart disease of native coronary artery with unspecified angina pectoris: Secondary | ICD-10-CM

## 2016-01-30 DIAGNOSIS — I480 Paroxysmal atrial fibrillation: Secondary | ICD-10-CM

## 2016-01-30 DIAGNOSIS — I10 Essential (primary) hypertension: Secondary | ICD-10-CM | POA: Diagnosis not present

## 2016-01-30 DIAGNOSIS — E785 Hyperlipidemia, unspecified: Secondary | ICD-10-CM

## 2016-01-30 MED ORDER — ISOSORBIDE MONONITRATE ER 30 MG PO TB24: 30.0000 mg | ORAL_TABLET | Freq: Every day | ORAL | Status: DC

## 2016-01-30 NOTE — Progress Notes (Signed)
Chief Complaint  Patient presents with  . Follow-up    pt having sob and chest pain requiring taking nitroglycerin for 3 months on and off, tiredness and heart fluttering      History of Present Illness: 75 yo WF with history of CAD s/p CABG in 2011, RBBB, renal artery stenosis, HTN, HLD, COPD, former tobacco abuse, breast cancer s/p lumpectomy 1997, post-op atrial fibrillation and anemia here today for cardiac follow up. She has been followed in the past by Dr Doreatha Lew. Her coronary artery bypass grafting was in February 2011. She had a left internal mammary artery to the LAD, vein graft to OM2, and a vein graft to the RCA. Dr Early follows her renal artery stenosis. She is known to have diastolic dysfunction and had some LE edema, was started on Lasix which improvement in lower extremity edema. She was seen in our office April 2015 and c/o chest pain, dyspnea and palpitations. I arranged a stress myoview but she cancelled. Beta blocker increased for palpitations. She agreed to cardiac cath December 2015 and a DES was placed in the severe ostial LAD stenosis. Last echo August 2016 with normal LV systolic function, trivial AI, mild MR.   She is here today for follow up. She reports feeling well. No chest pain or worsening of baseline dyspnea. She has baseline SOB with COPD.   Primary Care Physician: Leanna Battles   Last Lipid Profile: Followed in primary care.   Past Medical History  Diagnosis Date  . Left renal artery stenosis ([place])     a. Moderate L RAS  . Hyperlipidemia   . Hypertension   . COPD (chronic obstructive pulmonary disease) ([place])   . Atrial fibrillation ([place])   . Coronary artery disease     a. 11/2009 CABGx3: LIMA->LAD, VG->RCA, VG->OM2. b. Cath 10/11/2014 EF 50-55%,  patent SVG to OM2, occlueded SVG to OM1, atretic and nonfunctional LIMA to LAD, 90% ostial LAD lesion treated with PTCA/LAD  . Chronic diastolic CHF (congestive heart failure) ([place])     a. 08/2010 Echo: EF  65-70%, Gr 1 DD, Mild MR  . Scoliosis of thoracic spine   . Breast cancer ([facility]) 1997    "left"  . Heart murmur   . Myocardial infarction ([place]) 2011  . History of blood transfusion 11/2009    "related to OHS"   . GERD (gastroesophageal reflux disease)   . Arthritis     "all over"  . Chronic lower back pain     Past Surgical History  Procedure Laterality Date  . Cardiac catheterization  11/2009  . Coronary angioplasty with stent placement  10/11/2014    "1"  . Tonsillectomy  1973  . Appendectomy  1970's  . Abdominal hysterectomy  1970's  . Laparoscopic cholecystectomy  2001  . Breast biopsy Left 1997  . Breast lumpectomy Left 1997  . Tubal ligation  1982?  Marland Kitchen Coronary artery bypass graft  12/21/2009    CABGX3  Dr. Prescott Gum  . Left heart catheterization with coronary angiogram N/A 10/11/2014    Procedure: LEFT HEART CATHETERIZATION WITH CORONARY ANGIOGRAM;  Surgeon: Burnell Blanks, MD;  Location: [facility] C[facility] LAB;  Service: Cardiovascular;  Laterality: N/A;    Current Outpatient Prescriptions  Medication Sig Dispense Refill  . albuterol (PROVENTIL HFA;VENTOLIN HFA) 108 (90 BASE) MCG/ACT inhaler Inhale 1 puff into the lungs every 6 (six) hours as needed for wheezing or shortness of breath.    Marland Kitchen albuterol (PROVENTIL) (2.5 MG/3ML) 0.083% nebulizer  solution Take 2.5 mg by nebulization 2 (two) times daily.    Marland Kitchen ALPRAZolam (XANAX) 0.25 MG tablet Take 0.25 mg by mouth daily as needed.  3  . budesonide-formoterol (SYMBICORT) 160-4.5 MCG/ACT inhaler Inhale 2 puffs into the lungs 2 (two) times daily.    . clopidogrel (PLAVIX) 75 MG tablet Take 1 tablet (75 mg total) by mouth daily with breakfast. 30 tablet 11  . CRESTOR 40 MG tablet TAKE 1 TABLET BY MOUTH DAILY 30 tablet 0  . ferrous sulfate 325 (65 FE) MG tablet Take 1 tablet (325 mg total) by mouth 2 (two) times daily with a meal. 60 tablet 3  . HYDROcodone-acetaminophen (NORCO) 10-325 MG per tablet Take 1 tablet by mouth every 8  (eight) hours as needed. 30 tablet 0  . ipratropium (ATROVENT) 0.02 % nebulizer solution Take 0.5 mg by nebulization 2 (two) times daily.     . metoprolol (LOPRESSOR) 50 MG tablet TAKE 1 TABLET TWICE A DAY 60 tablet 2  . NITROSTAT 0.4 MG SL tablet Place 0.4 mg under the tongue every 5 (five) minutes as needed for chest pain.   12  . pantoprazole (PROTONIX) 40 MG tablet TAKE 1 TABLET EVERY DAY 30 tablet 0  . verapamil (COVERA HS) 240 MG (CO) 24 hr tablet Take 240 mg by mouth at bedtime.      . isosorbide mononitrate (IMDUR) 30 MG 24 hr tablet Take 1 tablet (30 mg total) by mouth daily. 30 tablet 11  . zolpidem (AMBIEN) 10 MG tablet Take 10 mg by mouth at bedtime as needed. for sleep  3   No current facility-administered medications for this visit.    Allergies  Allergen Reactions  . Iohexol      Code: SOB, Desc: ct @ ser w/ throat swelling & sob, ok w/ 13 hr prep//a.c., Onset Date: [id number]     Social History   Social History  . Marital Status: Widowed    Spouse Name: N/A  . Number of Children: 6  . Years of Education: N/A   Occupational History  . Retired-[employer]    Social History Main Topics  . Smoking status: Former Smoker -- 2.00 packs/day for 50 years    Types: Cigarettes    Quit date: 01/01/2010  . Smokeless tobacco: Never Used  . Alcohol Use: No  . Drug Use: No  . Sexual Activity: Not on file   Other Topics Concern  . Not on file   Social History Narrative    Family History  Problem Relation Age of Onset  . Heart failure Mother   . Heart disease Mother     Before age 39  . Diabetes Mother   . Hyperlipidemia Mother   . Hypertension Mother   . COPD Father   . Heart failure Grandchild   . Heart attack Brother   . Diabetes Brother   . Heart disease Brother     Before age 95  . Hyperlipidemia Brother   . Hypertension Brother   . Cancer Sister     small    Review of Systems:  As stated in the HPI and otherwise negative.   Ht 5\' 3"  (1.6 m)  Wt 171  lb 1.9 oz (77.62 kg)  BMI 30.32 kg/m2  Physical Examination: General: Well developed, well nourished, NAD HEENT: OP clear, mucus membranes moist SKIN: warm, dry. No rashes. Neuro: No focal deficits Musculoskeletal: Muscle strength 5/5 all ext Psychiatric: Mood and affect normal Neck: No JVD, no carotid bruits, no thyromegaly,  no lymphadenopathy. Lungs:Clear bilaterally, no wheezes, rhonci, crackles Cardiovascular: Regular rate and rhythm. No murmurs, gallops or rubs. Abdomen:Soft. Bowel sounds present. Non-tender.  Extremities: No lower extremity edema. Pulses are 2 + in the bilateral DP/PT.  Cardiac cath 10/11/14: Hemodynamic Findings: Central aortic pressure: 136/65 Left ventricular pressure: 133/4/21 Angiographic Findings: Left main: No left main. The LAD and Circumflex arise from different ostia.  Left Anterior Descending Artery: Large caliber vessel that courses to the apex. The vessel becomes small in caliber past the mid segment. There is an ostial 90% stenosis. (There is significant pressure dampening of the catheter with engagement of the LAD). The mid vessel has 30% stenosis. Small caliber diagonal branch with mild plaque.  Circumflex Artery: Proximal 30% stenosis. Patent OM1 with no obstructive disease. The OM2 is a small to moderate caliber branch with filling from antegrade flow and from the patent vein graft. The distal Circumflex is occluded.  Right Coronary Artery: Large dominant vessel with diffuse 30% proximal and mid vessel stenosis. No flow limiting lesions noted.  Graft Anatomy:  SVG to OM2 is patent SVG to OM1 is occluded LIMA to LAD is atretic and non-functional Left Ventricular Angiogram: LVEF=50-55%.   Echo August 2016: Left ventricle: The cavity size was normal. There was mild  concentric hypertrophy. Systolic function was vigorous. The  estimated ejection fraction was in the range of 65% to 70%. Wall  motion was normal; there were no regional wall  motion  abnormalities. Features are consistent with a pseudonormal left  ventricular filling pattern, with concomitant abnormal relaxation  and increased filling pressure (grade 2 diastolic dysfunction). - Aortic valve: There was trivial regurgitation. - Mitral valve: Moderately to severely calcified annulus. There was  mild regurgitation. - Left atrium: The atrium was mildly dilated. - Right atrium: The atrium was mildly dilated. - Tricuspid valve: There was moderate regurgitation. - Pulmonary arteries: Systolic pressure was moderately increased.  PA peak pressure: 52 mm Hg (S).  EKG:  EKG is ordered today. The ekg ordered today demonstrates sinus brady, rate 51 bpm. RBBB. Non-specific T wave abnormality.   Recent Labs: 06/02/2015: ALT 19; BUN 26*; Creatinine, Ser 1.23*; Creatinine, Ser 1.23*; Hemoglobin 9.5*; Platelets 177; Potassium 4.3; Sodium 141   Lipid Panel Followed in primary care   Wt Readings from Last 3 Encounters:  01/30/16 171 lb 1.9 oz (77.62 kg)  06/02/15 176 lb 9.6 oz (80.105 kg)  10/12/14 184 lb 1.4 oz (83.5 kg)     Other studies Reviewed: Additional studies/ records that were reviewed today include: . Review of the above records demonstrates:    Assessment and Plan:   1. CAD with stable angina:  Will continue statin, beta blocker and Plavix. Will stop ASA with easy bruising. Will add Imdur.   2. HYPERTENSION: BP controlled today. No changes.   3. Renal artery stenosis: Followed in [facility] by Dr. Donnetta Hutching.   4. Hyperlipidemia: She is on a statin. Lipids are followed in primary care.   5. Paroxysmal atrial fibrillation: Occurred post-op in 2011. Maintaining sinus.   Current medicines are reviewed at length with the patient today.  The patient does not have concerns regarding medicines.  Labs/ tests ordered today include:   Orders Placed This Encounter  Procedures  . EKG 12-Lead     Disposition:   FU with me in 6 months   Signed, Lauree Chandler, MD 01/30/2016 1:35 PM    [facility] [address], [place], [place]  [address] Phone: [phone])  [phone]; Fax: [phone]    

## 2016-01-30 NOTE — Patient Instructions (Signed)
Medication Instructions:  Your physician has recommended you make the following change in your medication:  Stop Aspirin. Start Isosorbide mononitrate 30 mg by mouth daily.    Labwork: none  Testing/Procedures: none  Follow-Up: Your physician wants you to follow-up in: 6 months.  You will receive a reminder letter in the mail two months in advance. If you don't receive a letter, please call our office to schedule the follow-up appointment.   Any Other Special Instructions Will Be Listed Below (If Applicable).     If you need a refill on your cardiac medications before your next appointment, please call your pharmacy.

## 2016-02-22 ENCOUNTER — Encounter: Payer: Self-pay | Admitting: Family

## 2016-03-03 ENCOUNTER — Ambulatory Visit (HOSPITAL_COMMUNITY): Payer: Commercial Managed Care - HMO | Attending: Vascular Surgery

## 2016-03-03 ENCOUNTER — Ambulatory Visit: Payer: Commercial Managed Care - HMO | Admitting: Family

## 2016-09-09 ENCOUNTER — Other Ambulatory Visit: Payer: Self-pay | Admitting: Neurological Surgery

## 2016-09-09 DIAGNOSIS — M4316 Spondylolisthesis, lumbar region: Secondary | ICD-10-CM

## 2016-09-21 ENCOUNTER — Ambulatory Visit
Admission: RE | Admit: 2016-09-21 | Discharge: 2016-09-21 | Disposition: A | Discharge status: Home / Self Care | Payer: Commercial Managed Care - HMO | Source: Ambulatory Visit | Attending: Neurological Surgery | Admitting: Neurological Surgery

## 2016-09-21 DIAGNOSIS — M4316 Spondylolisthesis, lumbar region: Secondary | ICD-10-CM

## 2016-10-21 ENCOUNTER — Other Ambulatory Visit: Payer: Self-pay

## 2016-10-21 MED ORDER — CLOPIDOGREL BISULFATE 75 MG PO TABS: 75.0000 mg | ORAL_TABLET | Freq: Every day | ORAL | 11 refills | Status: DC

## 2016-10-21 NOTE — Telephone Encounter (Signed)
OK to refill Plavix

## 2016-10-28 DIAGNOSIS — I10 Essential (primary) hypertension: Secondary | ICD-10-CM | POA: Diagnosis not present

## 2016-10-28 DIAGNOSIS — M5416 Radiculopathy, lumbar region: Secondary | ICD-10-CM | POA: Diagnosis not present

## 2016-10-28 DIAGNOSIS — M4316 Spondylolisthesis, lumbar region: Secondary | ICD-10-CM | POA: Diagnosis not present

## 2016-10-28 DIAGNOSIS — M48062 Spinal stenosis, lumbar region with neurogenic claudication: Secondary | ICD-10-CM | POA: Diagnosis not present

## 2016-10-28 DIAGNOSIS — M546 Pain in thoracic spine: Secondary | ICD-10-CM | POA: Diagnosis not present

## 2016-10-31 ENCOUNTER — Encounter: Payer: Self-pay | Admitting: Cardiovascular Disease

## 2016-10-31 ENCOUNTER — Encounter (INDEPENDENT_AMBULATORY_CARE_PROVIDER_SITE_OTHER): Payer: Self-pay

## 2016-10-31 ENCOUNTER — Ambulatory Visit (INDEPENDENT_AMBULATORY_CARE_PROVIDER_SITE_OTHER): Payer: Commercial Managed Care - HMO | Admitting: Cardiovascular Disease

## 2016-10-31 VITALS — BP 160/84 | HR 74 | Ht 62.0 in | Wt 170.0 lb

## 2016-10-31 DIAGNOSIS — E7800 Pure hypercholesterolemia, unspecified: Secondary | ICD-10-CM | POA: Diagnosis not present

## 2016-10-31 DIAGNOSIS — I2510 Atherosclerotic heart disease of native coronary artery without angina pectoris: Secondary | ICD-10-CM

## 2016-10-31 DIAGNOSIS — I480 Paroxysmal atrial fibrillation: Secondary | ICD-10-CM | POA: Diagnosis not present

## 2016-10-31 DIAGNOSIS — I10 Essential (primary) hypertension: Secondary | ICD-10-CM

## 2016-10-31 MED ORDER — CLONIDINE HCL 0.1 MG PO TABS: 0.1000 mg | ORAL_TABLET | Freq: Two times a day (BID) | ORAL | 6 refills | Status: DC

## 2016-10-31 MED ORDER — FUROSEMIDE 40 MG PO TABS: 40.0000 mg | ORAL_TABLET | Freq: Every day | ORAL | 6 refills | Status: DC

## 2016-10-31 MED ORDER — CLONIDINE HCL 0.1 MG PO TABS
0.1000 mg | ORAL_TABLET | ORAL | Status: AC
  Administered 2016-10-31: 0.1 mg via ORAL

## 2016-10-31 MED ORDER — METOPROLOL TARTRATE 50 MG PO TABS: 50.0000 mg | ORAL_TABLET | Freq: Two times a day (BID) | ORAL | 6 refills | Status: DC

## 2016-10-31 NOTE — Progress Notes (Signed)
Chief Complaint  Patient presents with  . Routine OV    History of Present Illness: 76 yo WF with history of CAD s/p CABG in 2011, RBBB, renal artery stenosis, HTN, HLD, COPD, former tobacco abuse, breast cancer s/p lumpectomy 1997, post-op atrial fibrillation and anemia here today for cardiac follow up. She has been followed in the past by Dr Doreatha Lew. Her coronary artery bypass grafting was in February 2011. She had a left internal mammary artery to the LAD, vein graft to OM2, and a vein graft to the RCA. Dr Early follows her renal artery stenosis. She is known to have diastolic dysfunction and had some LE edema, was started on Lasix which improvement in lower extremity edema. She was seen in our office April 2015 and c/o chest pain, dyspnea and palpitations. I arranged a stress myoview but she cancelled. Beta blocker increased for palpitations. She agreed to cardiac cath December 2015 and a DES was placed in the severe ostial LAD stenosis. Last echo August 2016 with normal LV systolic function, trivial AI, mild MR.   She is here today for follow up. She reports feeling well but with recent headaches. BP has been up at home. No chest pain or worsening of baseline dyspnea. She has baseline SOB with COPD. She reports that her metoprolol dose had been reduced and her Lasix had been stopped. She has worsened LE edema at home.   Primary Care Physician: Donnajean Lopes, MD  Past Medical History:  Diagnosis Date  . Arthritis    "all over"  . Atrial fibrillation ([place])   . Breast cancer ([place]) 1997   "left"  . Chronic diastolic CHF (congestive heart failure) ([place])    a. 08/2010 Echo: EF 65-70%, Gr 1 DD, Mild MR  . Chronic lower back pain   . COPD (chronic obstructive pulmonary disease) ([place])   . Coronary artery disease    a. 11/2009 CABGx3: LIMA->LAD, VG->RCA, VG->OM2. b. Cath 10/11/2014 EF 50-55%,  patent SVG to OM2, occlueded SVG to OM1, atretic and nonfunctional LIMA to LAD, 90% ostial LAD  lesion treated with PTCA/LAD  . GERD (gastroesophageal reflux disease)   . Heart murmur   . History of blood transfusion 11/2009   "related to OHS"   . Hyperlipidemia   . Hypertension   . Left renal artery stenosis ([place])    a. Moderate L RAS  . Myocardial infarction 2011  . Scoliosis of thoracic spine     Past Surgical History:  Procedure Laterality Date  . ABDOMINAL HYSTERECTOMY  1970's  . APPENDECTOMY  1970's  . BREAST BIOPSY Left 1997  . BREAST LUMPECTOMY Left 1997  . CARDIAC CATHETERIZATION  11/2009  . CORONARY ANGIOPLASTY WITH STENT PLACEMENT  10/11/2014   "1"  . CORONARY ARTERY BYPASS GRAFT  12/21/2009   CABGX3  Dr. Prescott Gum  . LAPAROSCOPIC CHOLECYSTECTOMY  2001  . LEFT HEART CATHETERIZATION WITH CORONARY ANGIOGRAM N/A 10/11/2014   Procedure: LEFT HEART CATHETERIZATION WITH CORONARY ANGIOGRAM;  Surgeon: Burnell Blanks, MD;  Location: [facility] CATH LAB;  Service: Cardiovascular;  Laterality: N/A;  . TONSILLECTOMY  1973  . TUBAL LIGATION  1982?    Current Outpatient Prescriptions  Medication Sig Dispense Refill  . albuterol (PROVENTIL HFA;VENTOLIN HFA) 108 (90 BASE) MCG/ACT inhaler Inhale 1 puff into the lungs every 6 (six) hours as needed for wheezing or shortness of breath.    Marland Kitchen albuterol (PROVENTIL) (2.5 MG/3ML) 0.083% nebulizer solution Take 2.5 mg by nebulization 2 (two) times daily.    Marland Kitchen  ALPRAZolam (XANAX) 0.25 MG tablet Take 0.25 mg by mouth daily as needed.  3  . budesonide-formoterol (SYMBICORT) 160-4.5 MCG/ACT inhaler Inhale 2 puffs into the lungs 2 (two) times daily.    . clopidogrel (PLAVIX) 75 MG tablet Take 1 tablet (75 mg total) by mouth daily with breakfast. 30 tablet 11  . CRESTOR 40 MG tablet TAKE 1 TABLET BY MOUTH DAILY 30 tablet 0  . ferrous sulfate 325 (65 FE) MG tablet Take 1 tablet (325 mg total) by mouth 2 (two) times daily with a meal. 60 tablet 3  . HYDROcodone-acetaminophen (NORCO) 10-325 MG per tablet Take 1 tablet by mouth every 8 (eight) hours  as needed. 30 tablet 0  . ipratropium (ATROVENT) 0.02 % nebulizer solution Take 0.5 mg by nebulization 2 (two) times daily.     . isosorbide mononitrate (IMDUR) 30 MG 24 hr tablet Take 1 tablet (30 mg total) by mouth daily. 30 tablet 11  . metoprolol (LOPRESSOR) 50 MG tablet TAKE 1 TABLET TWICE A DAY 60 tablet 2  . NITROSTAT 0.4 MG SL tablet Place 0.4 mg under the tongue every 5 (five) minutes as needed for chest pain.   12  . pantoprazole (PROTONIX) 40 MG tablet TAKE 1 TABLET EVERY DAY 30 tablet 0  . verapamil (COVERA HS) 240 MG (CO) 24 hr tablet Take 240 mg by mouth at bedtime.      Marland Kitchen zolpidem (AMBIEN) 10 MG tablet Take 10 mg by mouth at bedtime as needed. for sleep  3   No current facility-administered medications for this visit.     Allergies  Allergen Reactions  . Iohexol      Code: SOB, Desc: ct @ ser w/ throat swelling & sob, ok w/ 13 hr prep//a.c., Onset Date: [id number]     Social History   Social History  . Marital status: Widowed    Spouse name: N/A  . Number of children: 6  . Years of education: N/A   Occupational History  . Retired-[employer] Retired   Social History Main Topics  . Smoking status: Former Smoker    Packs/day: 2.00    Years: 50.00    Types: Cigarettes    Quit date: 01/01/2010  . Smokeless tobacco: Never Used  . Alcohol use No  . Drug use: No  . Sexual activity: Not on file   Other Topics Concern  . Not on file   Social History Narrative  . No narrative on file    Family History  Problem Relation Age of Onset  . Heart failure Mother   . Heart disease Mother     Before age 70  . Diabetes Mother   . Hyperlipidemia Mother   . Hypertension Mother   . COPD Father   . Heart disease Brother     Before age 10  . Hyperlipidemia Brother   . Hypertension Brother   . Cancer Sister     small  . Heart failure Grandchild   . Heart attack Brother   . Diabetes Brother     Review of Systems:  As stated in the HPI and otherwise negative.   BP  (!) 200/96 (BP Location: Right Arm, Patient Position: Sitting, Cuff Size: Normal)   Pulse 74   Ht 5\' 2"  (1.575 m)   Wt 170 lb (77.1 kg)   SpO2 95%   BMI 31.09 kg/m   Physical Examination: General: Well developed, well nourished, NAD  HEENT: OP clear, mucus membranes moist  SKIN: warm, dry. No rashes.  Neuro: No focal deficits  Musculoskeletal: Muscle strength 5/5 all ext  Psychiatric: Mood and affect normal  Neck: No JVD, no carotid bruits, no thyromegaly, no lymphadenopathy.  Lungs:Clear bilaterally, no wheezes, rhonci, crackles Cardiovascular: Regular rate and rhythm. No murmurs, gallops or rubs. Abdomen:Soft. Bowel sounds present. Non-tender.  Extremities: No lower extremity edema. Pulses are 2 + in the bilateral DP/PT.  Cardiac cath 10/11/14: Hemodynamic Findings: Central aortic pressure: 136/65 Left ventricular pressure: 133/4/21 Angiographic Findings: Left main: No left main. The LAD and Circumflex arise from different ostia.  Left Anterior Descending Artery: Large caliber vessel that courses to the apex. The vessel becomes small in caliber past the mid segment. There is an ostial 90% stenosis. (There is significant pressure dampening of the catheter with engagement of the LAD). The mid vessel has 30% stenosis. Small caliber diagonal branch with mild plaque.  Circumflex Artery: Proximal 30% stenosis. Patent OM1 with no obstructive disease. The OM2 is a small to moderate caliber branch with filling from antegrade flow and from the patent vein graft. The distal Circumflex is occluded.  Right Coronary Artery: Large dominant vessel with diffuse 30% proximal and mid vessel stenosis. No flow limiting lesions noted.  Graft Anatomy:  SVG to OM2 is patent SVG to OM1 is occluded LIMA to LAD is atretic and non-functional Left Ventricular Angiogram: LVEF=50-55%.   Echo August 2016: Left ventricle: The cavity size was normal. There was mild  concentric hypertrophy. Systolic  function was vigorous. The  estimated ejection fraction was in the range of 65% to 70%. Wall  motion was normal; there were no regional wall motion  abnormalities. Features are consistent with a pseudonormal left  ventricular filling pattern, with concomitant abnormal relaxation  and increased filling pressure (grade 2 diastolic dysfunction). - Aortic valve: There was trivial regurgitation. - Mitral valve: Moderately to severely calcified annulus. There was  mild regurgitation. - Left atrium: The atrium was mildly dilated. - Right atrium: The atrium was mildly dilated. - Tricuspid valve: There was moderate regurgitation. - Pulmonary arteries: Systolic pressure was moderately increased.  PA peak pressure: 52 mm Hg (S).  EKG:  EKG is  ordered today. The ekg ordered today demonstrates NSR, rate 68 bpm. RBBB. LAFB  Recent Labs: No results found for requested labs within last 8760 hours.   Lipid Panel Followed in primary care   Wt Readings from Last 3 Encounters:  10/31/16 170 lb (77.1 kg)  01/30/16 171 lb 1.9 oz (77.6 kg)  06/02/15 176 lb 9.6 oz (80.1 kg)     Other studies Reviewed: Additional studies/ records that were reviewed today include: . Review of the above records demonstrates:    Assessment and Plan:   1. CAD without angina: No recent chest pain suggestive of angina.  Will continue statin, Imdur, beta blocker and Plavix. ASA stopped due to easy bruising.    2. HYPERTENSION: BP elevated today. We have given clonidine 0.1 mg once here in the office. BP is better. (down to 160/80). Will increase Metoprolol to 50 mg po BID. Restart Lasix 40 mg daily. Will start clonidine 0.1 mg po BID.  Check BMET today. She will need close f/u one week with BP check and repeat BMET.   3. Renal artery stenosis: Followed in [facility] by Dr. Donnetta Hutching. She needs to get back in to see him since her BP has been so much higher.   4. Hyperlipidemia: She is on a statin. Lipids are followed in  primary care.   5. Paroxysmal atrial  fibrillation: Occurred post-op in 2011. Maintaining sinus.   Current medicines are reviewed at length with the patient today.  The patient does not have concerns regarding medicines.  Labs/ tests ordered today include:   No orders of the defined types were placed in this encounter.    Disposition:   FU with office APP in one week for BP check and BMET   Signed, Lauree Chandler, MD 10/31/2016 3:25 PM    [facility] [address], [place], [place]  [address] Phone: [phone]; Fax: [phone]

## 2016-10-31 NOTE — Patient Instructions (Signed)
Medication Instructions:  Your physician has recommended you make the following change in your medication:  Increase lopressor to 50 mg by mouth twice daily. Start furosemide 40 mg by mouth daily. Start Clonidine 0.1 mg by mouth twice daily.    Labwork: Lab work to be done today--BMP  Testing/Procedures: none  Follow-Up: Your physician recommends that you schedule a follow-up appointment in: one week with NP or PA.     Any Other Special Instructions Will Be Listed Below (If Applicable).     If you need a refill on your cardiac medications before your next appointment, please call your pharmacy.

## 2016-11-01 LAB — BASIC METABOLIC PANEL
BUN / CREAT RATIO: 20 (ref 12–28)
BUN: 22 mg/dL (ref 8–27)
CALCIUM: 9.2 mg/dL (ref 8.7–10.3)
CO2: 23 mmol/L (ref 18–29)
Chloride: 103 mmol/L (ref 96–106)
Creatinine, Ser: 1.08 mg/dL — ABNORMAL HIGH (ref 0.57–1.00)
GFR calc Af Amer: 58 mL/min/{1.73_m2} — ABNORMAL LOW (ref >59)
GFR calc non Af Amer: 51 mL/min/{1.73_m2} — ABNORMAL LOW (ref >59)
Glucose: 108 mg/dL — ABNORMAL HIGH (ref 65–99)
POTASSIUM: 4.1 mmol/L (ref 3.5–5.2)
SODIUM: 145 mmol/L — AB (ref 134–144)

## 2016-11-03 DIAGNOSIS — M5416 Radiculopathy, lumbar region: Secondary | ICD-10-CM | POA: Diagnosis not present

## 2016-11-03 DIAGNOSIS — M4316 Spondylolisthesis, lumbar region: Secondary | ICD-10-CM | POA: Diagnosis not present

## 2016-11-03 DIAGNOSIS — M48062 Spinal stenosis, lumbar region with neurogenic claudication: Secondary | ICD-10-CM | POA: Diagnosis not present

## 2016-11-03 DIAGNOSIS — M546 Pain in thoracic spine: Secondary | ICD-10-CM | POA: Diagnosis not present

## 2016-11-06 ENCOUNTER — Encounter: Payer: Self-pay | Admitting: Cardiology

## 2016-11-06 ENCOUNTER — Ambulatory Visit (INDEPENDENT_AMBULATORY_CARE_PROVIDER_SITE_OTHER): Payer: Medicare HMO | Admitting: Cardiology

## 2016-11-06 VITALS — BP 120/70 | HR 62 | Ht 62.0 in | Wt 169.0 lb

## 2016-11-06 DIAGNOSIS — I10 Essential (primary) hypertension: Secondary | ICD-10-CM

## 2016-11-06 NOTE — Patient Instructions (Signed)
Medication Instructions:  Your physician recommends that you continue on your current medications as directed. Please refer to the Current Medication list given to you today.   Labwork: TODAY BMET  Testing/Procedures: NONE  Follow-Up: Your physician wants you to follow-up in: 6 MONTHS WITH DR. Angelena Form You will receive a reminder letter in the mail two months in advance. If you don't receive a letter, please call our office to schedule the follow-up appointment.   Any Other Special Instructions Will Be Listed Below (If Applicable).     If you need a refill on your cardiac medications before your next appointment, please call your pharmacy.

## 2016-11-06 NOTE — Progress Notes (Signed)
11/06/2016 Joanne [name]   1941-10-18  [id number]  Primary Physician Donnajean Lopes, MD Primary Cardiologist: Dr. Angelena Form    Reason for Visit/CC: f/u for HTN  HPI:  76 yo WF with history of CAD s/p CABG in 2011, RBBB, renal artery stenosis, HTN, HLD, COPD, former tobacco abuse, breast cancer s/p lumpectomy 1997, post-op atrial fibrillation and anemia here today for cardiac follow up. She has been followed in the past by Dr Doreatha Lew. Her coronary artery bypass grafting was in February 2011. She had a left internal mammary artery to the LAD, vein graft to OM2, and a vein graft to the RCA. Dr Early follows her renal artery stenosis. She is known to have diastolic dysfunction and had some LE edema, was started on Lasix which improvement in lower extremity edema. She was seen in our office April 2015 and c/o chest pain, dyspnea and palpitations. Dr. Angelena Form arranged a stress myoview but she cancelled. Beta blocker was increased for palpitations. She agreed to cardiac cath December 2015 and a DES was placed in the severe ostial LAD stenosis. Last echo August 2016 with normal LV systolic function, trivial AI, mild MR.  She was seen recently by Dr. Angelena Form for routien f/u on 10/31/2016. She denied CP but noted worsening LEE.  She was noted to be hypertensive with an initial BP of 200/96. She was symptomatic with a HA. She was given a dose of clonidine 0.1 mg in the office and her BP improved to 160/80. Dr. Angelena Form increased her metoprolol to 50 mg BID. He restarted lasix 40 mg daily and added clonidine 0.1 mg PO BID.  She presents back to clinic today for f/u and for repeat BMP. Her BP is much improved at 120/70. HR is stable at 62 bpm. She reports that she is tolerating her meds ok. No side effects. She reports full compliance. No chest pain, dyspnea, HA, fatigue, edema or dizziness.   She also states that she is needing a lumbar injection for low back pain and is asking if she can hold Plavix x 6  days.    Current Meds  Medication Sig  . albuterol (PROVENTIL HFA;VENTOLIN HFA) 108 (90 BASE) MCG/ACT inhaler Inhale 1 puff into the lungs every 6 (six) hours as needed for wheezing or shortness of breath.  Marland Kitchen albuterol (PROVENTIL) (2.5 MG/3ML) 0.083% nebulizer solution Take 2.5 mg by nebulization 2 (two) times daily.  Marland Kitchen ALPRAZolam (XANAX) 0.25 MG tablet Take 0.25 mg by mouth daily as needed.  . budesonide-formoterol (SYMBICORT) 160-4.5 MCG/ACT inhaler Inhale 2 puffs into the lungs 2 (two) times daily.  . cloNIDine (CATAPRES) 0.1 MG tablet Take 1 tablet (0.1 mg total) by mouth 2 (two) times daily.  . clopidogrel (PLAVIX) 75 MG tablet Take 1 tablet (75 mg total) by mouth daily with breakfast.  . CRESTOR 40 MG tablet TAKE 1 TABLET BY MOUTH DAILY  . ferrous sulfate 325 (65 FE) MG tablet Take 1 tablet (325 mg total) by mouth 2 (two) times daily with a meal.  . furosemide (LASIX) 40 MG tablet Take 1 tablet (40 mg total) by mouth daily.  Marland Kitchen HYDROcodone-acetaminophen (NORCO) 10-325 MG per tablet Take 1 tablet by mouth every 8 (eight) hours as needed.  Marland Kitchen ipratropium (ATROVENT) 0.02 % nebulizer solution Take 0.5 mg by nebulization 2 (two) times daily.   . isosorbide mononitrate (IMDUR) 30 MG 24 hr tablet Take 1 tablet (30 mg total) by mouth daily.  . methocarbamol (ROBAXIN) 500 MG tablet 2 (two) times daily.  Marland Kitchen  metoprolol (LOPRESSOR) 50 MG tablet Take 1 tablet (50 mg total) by mouth 2 (two) times daily.  Marland Kitchen NITROSTAT 0.4 MG SL tablet Place 0.4 mg under the tongue every 5 (five) minutes as needed for chest pain.   . pantoprazole (PROTONIX) 40 MG tablet TAKE 1 TABLET EVERY DAY  . verapamil (COVERA HS) 240 MG (CO) 24 hr tablet Take 240 mg by mouth at bedtime.    Marland Kitchen zolpidem (AMBIEN) 10 MG tablet Take 10 mg by mouth at bedtime as needed. for sleep   Allergies  Allergen Reactions  . Iohexol      Code: SOB, Desc: ct @ ser w/ throat swelling & sob, ok w/ 13 hr prep//a.c., Onset Date: [id number]    Past  Medical History:  Diagnosis Date  . Arthritis    "all over"  . Atrial fibrillation ([place])   . Breast cancer ([place]) 1997   "left"  . Chronic diastolic CHF (congestive heart failure) ([place])    a. 08/2010 Echo: EF 65-70%, Gr 1 DD, Mild MR  . Chronic lower back pain   . COPD (chronic obstructive pulmonary disease) ([place])   . Coronary artery disease    a. 11/2009 CABGx3: LIMA->LAD, VG->RCA, VG->OM2. b. Cath 10/11/2014 EF 50-55%,  patent SVG to OM2, occlueded SVG to OM1, atretic and nonfunctional LIMA to LAD, 90% ostial LAD lesion treated with PTCA/LAD  . GERD (gastroesophageal reflux disease)   . Heart murmur   . History of blood transfusion 11/2009   "related to OHS"   . Hyperlipidemia   . Hypertension   . Left renal artery stenosis ([place])    a. Moderate L RAS  . Myocardial infarction 2011  . Scoliosis of thoracic spine    Family History  Problem Relation Age of Onset  . Heart failure Mother   . Heart disease Mother     Before age 54  . Diabetes Mother   . Hyperlipidemia Mother   . Hypertension Mother   . COPD Father   . Heart disease Brother     Before age 80  . Hyperlipidemia Brother   . Hypertension Brother   . Cancer Sister     small  . Heart failure Grandchild   . Heart attack Brother   . Diabetes Brother    Past Surgical History:  Procedure Laterality Date  . ABDOMINAL HYSTERECTOMY  1970's  . APPENDECTOMY  1970's  . BREAST BIOPSY Left 1997  . BREAST LUMPECTOMY Left 1997  . CARDIAC CATHETERIZATION  11/2009  . CORONARY ANGIOPLASTY WITH STENT PLACEMENT  10/11/2014   "1"  . CORONARY ARTERY BYPASS GRAFT  12/21/2009   CABGX3  Dr. Prescott Gum  . LAPAROSCOPIC CHOLECYSTECTOMY  2001  . LEFT HEART CATHETERIZATION WITH CORONARY ANGIOGRAM N/A 10/11/2014   Procedure: LEFT HEART CATHETERIZATION WITH CORONARY ANGIOGRAM;  Surgeon: Burnell Blanks, MD;  Location: [facility] CATH LAB;  Service: Cardiovascular;  Laterality: N/A;  . TONSILLECTOMY  1973  . TUBAL LIGATION  1982?   Social  History   Social History  . Marital status: Widowed    Spouse name: N/A  . Number of children: 6  . Years of education: N/A   Occupational History  . Retired-[employer] Retired   Social History Main Topics  . Smoking status: Former Smoker    Packs/day: 2.00    Years: 50.00    Types: Cigarettes    Quit date: 01/01/2010  . Smokeless tobacco: Never Used  . Alcohol use No  . Drug use: No  . Sexual  activity: Not on file   Other Topics Concern  . Not on file   Social History Narrative  . No narrative on file     Review of Systems: General: negative for chills, fever, night sweats or weight changes.  Cardiovascular: negative for chest pain, dyspnea on exertion, edema, orthopnea, palpitations, paroxysmal nocturnal dyspnea or shortness of breath Dermatological: negative for rash Respiratory: negative for cough or wheezing Urologic: negative for hematuria Abdominal: negative for nausea, vomiting, diarrhea, bright red blood per rectum, melena, or hematemesis Neurologic: negative for visual changes, syncope, or dizziness All other systems reviewed and are otherwise negative except as noted above.   Physical Exam:  Blood pressure 120/70, pulse 62, height 5\' 2"  (1.575 m), weight 169 lb (76.7 kg), SpO2 96 %.  General appearance: alert, cooperative and no distress Neck: no carotid bruit and no JVD Lungs: clear to auscultation bilaterally Heart: regular rate and rhythm, S1, S2 normal, no murmur, click, rub or gallop Extremities: extremities normal, atraumatic, no cyanosis or edema Pulses: 2+ and symmetric Skin: Skin color, texture, turgor normal. No rashes or lesions Neurologic: Grossly normal  EKG not performed  ASSESSMENT AND PLAN:   1. CAD without angina: No recent chest pain suggestive of angina.  Last PCI was in 2015. Ok to hold Plavix x 6 days for back injection. Resume when able. Will continue statin, Imdur, beta blocker and Plavix. ASA stopped due to easy bruising.    2.  HYPERTENSION: Improved with increase in Metoprolol to 50 mg po BID, initiation of  Lasix 40 mg daily and clonidine 0.1 mg po BID. Symptoms of frequent HAs have resolved. BP is 120/70 today. LEE resolved and HR stable. Check BMET today to check renal function and K.   3. Renal artery stenosis: Followed in [facility] by Dr. Donnetta Hutching. She needs to get back in to see him since her BP has been so much higher.   4. Hyperlipidemia: She is on a statin. Lipids are followed in primary care.    5. Paroxysmal atrial fibrillation: Occurred post-op in 2011. Maintaining sinus. HR is well controlled with BB.    PLAN  F/u with Dr. Angelena Form in 6 months.   Lyda Jester PA-C 11/06/2016 5:25 PM

## 2016-11-07 LAB — BASIC METABOLIC PANEL
BUN/Creatinine Ratio: 25 (ref 12–28)
BUN: 30 mg/dL — AB (ref 8–27)
CALCIUM: 9.1 mg/dL (ref 8.7–10.3)
CHLORIDE: 101 mmol/L (ref 96–106)
CO2: 24 mmol/L (ref 18–29)
CREATININE: 1.22 mg/dL — AB (ref 0.57–1.00)
GFR, EST AFRICAN AMERICAN: 50 mL/min/{1.73_m2} — AB (ref >59)
GFR, EST NON AFRICAN AMERICAN: 44 mL/min/{1.73_m2} — AB (ref >59)
Glucose: 91 mg/dL (ref 65–99)
Potassium: 4.4 mmol/L (ref 3.5–5.2)
Sodium: 140 mmol/L (ref 134–144)

## 2016-12-02 DIAGNOSIS — N183 Chronic kidney disease, stage 3 (moderate): Secondary | ICD-10-CM | POA: Diagnosis not present

## 2016-12-02 DIAGNOSIS — R8299 Other abnormal findings in urine: Secondary | ICD-10-CM | POA: Diagnosis not present

## 2016-12-02 DIAGNOSIS — R7301 Impaired fasting glucose: Secondary | ICD-10-CM | POA: Diagnosis not present

## 2016-12-02 DIAGNOSIS — I10 Essential (primary) hypertension: Secondary | ICD-10-CM | POA: Diagnosis not present

## 2016-12-24 ENCOUNTER — Telehealth: Payer: Self-pay | Admitting: Cardiovascular Disease

## 2016-12-24 NOTE — Telephone Encounter (Signed)
New messge      Request for surgical clearance:  1. What type of surgery is being performed? Lumbar epidural  2. When is this surgery scheduled?  Pending clearance  Are there any medications that need to be held prior to surgery and how long? Hold plavix 7 days prior 3. Name of physician performing surgery?  Dr Brien Few  What is your office phone and fax number?  Fax [phone]

## 2016-12-24 NOTE — Telephone Encounter (Signed)
This note and Joanne [name]'s office note 11/06/16 with notes about holding Plavix faxed to Dr Brien Few.

## 2016-12-24 NOTE — Telephone Encounter (Signed)
See Joanne [name] note from 11/13/16 where she stated Plavix could be held. She can hold Plavix for her procedure. Can we send that office note?   Lauree Chandler 12/24/2016 4:35 PM

## 2016-12-24 NOTE — Telephone Encounter (Signed)
I will forward to Dr McAlhany for review. 

## 2017-01-27 DIAGNOSIS — I10 Essential (primary) hypertension: Secondary | ICD-10-CM | POA: Diagnosis not present

## 2017-01-27 DIAGNOSIS — M5416 Radiculopathy, lumbar region: Secondary | ICD-10-CM | POA: Diagnosis not present

## 2017-03-31 ENCOUNTER — Other Ambulatory Visit: Payer: Self-pay | Admitting: *Deleted

## 2017-03-31 MED ORDER — NITROGLYCERIN 0.4 MG SL SUBL
0.4000 mg | SUBLINGUAL_TABLET | SUBLINGUAL | 1 refills | Status: DC | PRN
Start: 2017-03-31 — End: 2018-02-24

## 2017-05-18 DIAGNOSIS — I10 Essential (primary) hypertension: Secondary | ICD-10-CM | POA: Diagnosis not present

## 2017-05-18 DIAGNOSIS — R69 Illness, unspecified: Secondary | ICD-10-CM | POA: Diagnosis not present

## 2017-05-18 DIAGNOSIS — M545 Low back pain: Secondary | ICD-10-CM | POA: Diagnosis not present

## 2017-05-18 DIAGNOSIS — C50919 Malignant neoplasm of unspecified site of unspecified female breast: Secondary | ICD-10-CM | POA: Diagnosis not present

## 2017-05-18 DIAGNOSIS — N183 Chronic kidney disease, stage 3 (moderate): Secondary | ICD-10-CM | POA: Diagnosis not present

## 2017-05-18 DIAGNOSIS — J449 Chronic obstructive pulmonary disease, unspecified: Secondary | ICD-10-CM | POA: Diagnosis not present

## 2017-05-18 DIAGNOSIS — L988 Other specified disorders of the skin and subcutaneous tissue: Secondary | ICD-10-CM | POA: Diagnosis not present

## 2017-05-18 DIAGNOSIS — R7301 Impaired fasting glucose: Secondary | ICD-10-CM | POA: Diagnosis not present

## 2017-05-18 DIAGNOSIS — Z1389 Encounter for screening for other disorder: Secondary | ICD-10-CM | POA: Diagnosis not present

## 2017-05-18 DIAGNOSIS — I2510 Atherosclerotic heart disease of native coronary artery without angina pectoris: Secondary | ICD-10-CM | POA: Diagnosis not present

## 2017-06-24 DIAGNOSIS — L821 Other seborrheic keratosis: Secondary | ICD-10-CM | POA: Diagnosis not present

## 2017-06-24 DIAGNOSIS — L570 Actinic keratosis: Secondary | ICD-10-CM | POA: Diagnosis not present

## 2017-06-24 DIAGNOSIS — C44612 Basal cell carcinoma of skin of right upper limb, including shoulder: Secondary | ICD-10-CM | POA: Diagnosis not present

## 2017-06-24 DIAGNOSIS — D485 Neoplasm of uncertain behavior of skin: Secondary | ICD-10-CM | POA: Diagnosis not present

## 2017-07-13 ENCOUNTER — Other Ambulatory Visit: Payer: Self-pay

## 2017-07-13 MED ORDER — METOPROLOL TARTRATE 50 MG PO TABS: 50.0000 mg | ORAL_TABLET | Freq: Two times a day (BID) | ORAL | 6 refills | Status: DC

## 2017-07-13 MED ORDER — CLONIDINE HCL 0.1 MG PO TABS: 0.1000 mg | ORAL_TABLET | Freq: Two times a day (BID) | ORAL | 3 refills | Status: DC

## 2017-10-12 ENCOUNTER — Other Ambulatory Visit: Payer: Self-pay

## 2017-10-12 MED ORDER — CLONIDINE HCL 0.1 MG PO TABS: 0.1000 mg | ORAL_TABLET | Freq: Two times a day (BID) | ORAL | 0 refills | Status: DC

## 2017-10-13 DIAGNOSIS — I2510 Atherosclerotic heart disease of native coronary artery without angina pectoris: Secondary | ICD-10-CM | POA: Diagnosis not present

## 2017-10-13 DIAGNOSIS — R410 Disorientation, unspecified: Secondary | ICD-10-CM | POA: Diagnosis not present

## 2017-10-13 DIAGNOSIS — D6489 Other specified anemias: Secondary | ICD-10-CM | POA: Diagnosis not present

## 2017-10-13 DIAGNOSIS — R7301 Impaired fasting glucose: Secondary | ICD-10-CM | POA: Diagnosis not present

## 2017-10-13 DIAGNOSIS — R69 Illness, unspecified: Secondary | ICD-10-CM | POA: Diagnosis not present

## 2017-10-13 DIAGNOSIS — I10 Essential (primary) hypertension: Secondary | ICD-10-CM | POA: Diagnosis not present

## 2017-10-13 DIAGNOSIS — R0602 Shortness of breath: Secondary | ICD-10-CM | POA: Diagnosis not present

## 2017-10-13 DIAGNOSIS — J449 Chronic obstructive pulmonary disease, unspecified: Secondary | ICD-10-CM | POA: Diagnosis not present

## 2017-10-13 DIAGNOSIS — N183 Chronic kidney disease, stage 3 (moderate): Secondary | ICD-10-CM | POA: Diagnosis not present

## 2017-10-13 DIAGNOSIS — R0609 Other forms of dyspnea: Secondary | ICD-10-CM | POA: Diagnosis not present

## 2017-10-23 ENCOUNTER — Emergency Department (HOSPITAL_COMMUNITY): Payer: Medicare HMO

## 2017-10-23 ENCOUNTER — Other Ambulatory Visit: Payer: Self-pay

## 2017-10-23 ENCOUNTER — Inpatient Hospital Stay (HOSPITAL_COMMUNITY)
Admission: EM | Admit: 2017-10-23 | Discharge: 2017-10-28 | DRG: 392 | Disposition: A | Discharge status: Home / Self Care | Payer: Medicare HMO | Attending: Family Medicine | Admitting: Family Medicine

## 2017-10-23 ENCOUNTER — Encounter (HOSPITAL_COMMUNITY): Payer: Self-pay | Admitting: Emergency Medicine

## 2017-10-23 DIAGNOSIS — M1990 Unspecified osteoarthritis, unspecified site: Secondary | ICD-10-CM | POA: Diagnosis present

## 2017-10-23 DIAGNOSIS — J449 Chronic obstructive pulmonary disease, unspecified: Secondary | ICD-10-CM | POA: Diagnosis present

## 2017-10-23 DIAGNOSIS — I110 Hypertensive heart disease with heart failure: Secondary | ICD-10-CM | POA: Diagnosis present

## 2017-10-23 DIAGNOSIS — K5732 Diverticulitis of large intestine without perforation or abscess without bleeding: Secondary | ICD-10-CM | POA: Diagnosis present

## 2017-10-23 DIAGNOSIS — I701 Atherosclerosis of renal artery: Secondary | ICD-10-CM | POA: Diagnosis present

## 2017-10-23 DIAGNOSIS — M419 Scoliosis, unspecified: Secondary | ICD-10-CM | POA: Diagnosis present

## 2017-10-23 DIAGNOSIS — I4891 Unspecified atrial fibrillation: Secondary | ICD-10-CM | POA: Diagnosis present

## 2017-10-23 DIAGNOSIS — K219 Gastro-esophageal reflux disease without esophagitis: Secondary | ICD-10-CM | POA: Diagnosis present

## 2017-10-23 DIAGNOSIS — Z91041 Radiographic dye allergy status: Secondary | ICD-10-CM

## 2017-10-23 DIAGNOSIS — N823 Fistula of vagina to large intestine: Secondary | ICD-10-CM | POA: Diagnosis not present

## 2017-10-23 DIAGNOSIS — K5720 Diverticulitis of large intestine with perforation and abscess without bleeding: Secondary | ICD-10-CM

## 2017-10-23 DIAGNOSIS — Z23 Encounter for immunization: Secondary | ICD-10-CM

## 2017-10-23 DIAGNOSIS — Z79899 Other long term (current) drug therapy: Secondary | ICD-10-CM | POA: Diagnosis not present

## 2017-10-23 DIAGNOSIS — Z880 Allergy status to penicillin: Secondary | ICD-10-CM

## 2017-10-23 DIAGNOSIS — I10 Essential (primary) hypertension: Secondary | ICD-10-CM | POA: Diagnosis present

## 2017-10-23 DIAGNOSIS — K625 Hemorrhage of anus and rectum: Secondary | ICD-10-CM | POA: Diagnosis not present

## 2017-10-23 DIAGNOSIS — Z87891 Personal history of nicotine dependence: Secondary | ICD-10-CM | POA: Diagnosis not present

## 2017-10-23 DIAGNOSIS — Z7951 Long term (current) use of inhaled steroids: Secondary | ICD-10-CM | POA: Diagnosis not present

## 2017-10-23 DIAGNOSIS — Z7902 Long term (current) use of antithrombotics/antiplatelets: Secondary | ICD-10-CM

## 2017-10-23 DIAGNOSIS — J410 Simple chronic bronchitis: Secondary | ICD-10-CM | POA: Diagnosis not present

## 2017-10-23 DIAGNOSIS — I700 Atherosclerosis of aorta: Secondary | ICD-10-CM | POA: Diagnosis present

## 2017-10-23 DIAGNOSIS — N824 Other female intestinal-genital tract fistulae: Secondary | ICD-10-CM

## 2017-10-23 DIAGNOSIS — K5792 Diverticulitis of intestine, part unspecified, without perforation or abscess without bleeding: Secondary | ICD-10-CM

## 2017-10-23 DIAGNOSIS — N898 Other specified noninflammatory disorders of vagina: Secondary | ICD-10-CM | POA: Diagnosis present

## 2017-10-23 DIAGNOSIS — Z955 Presence of coronary angioplasty implant and graft: Secondary | ICD-10-CM

## 2017-10-23 DIAGNOSIS — D509 Iron deficiency anemia, unspecified: Secondary | ICD-10-CM | POA: Diagnosis present

## 2017-10-23 DIAGNOSIS — R0602 Shortness of breath: Secondary | ICD-10-CM | POA: Diagnosis not present

## 2017-10-23 DIAGNOSIS — D649 Anemia, unspecified: Secondary | ICD-10-CM | POA: Diagnosis not present

## 2017-10-23 DIAGNOSIS — Z853 Personal history of malignant neoplasm of breast: Secondary | ICD-10-CM

## 2017-10-23 DIAGNOSIS — I5032 Chronic diastolic (congestive) heart failure: Secondary | ICD-10-CM | POA: Diagnosis present

## 2017-10-23 DIAGNOSIS — R197 Diarrhea, unspecified: Secondary | ICD-10-CM | POA: Diagnosis not present

## 2017-10-23 DIAGNOSIS — E785 Hyperlipidemia, unspecified: Secondary | ICD-10-CM | POA: Diagnosis not present

## 2017-10-23 DIAGNOSIS — K5780 Diverticulitis of intestine, part unspecified, with perforation and abscess without bleeding: Secondary | ICD-10-CM | POA: Diagnosis not present

## 2017-10-23 DIAGNOSIS — I2510 Atherosclerotic heart disease of native coronary artery without angina pectoris: Secondary | ICD-10-CM | POA: Diagnosis present

## 2017-10-23 DIAGNOSIS — D638 Anemia in other chronic diseases classified elsewhere: Secondary | ICD-10-CM | POA: Diagnosis present

## 2017-10-23 DIAGNOSIS — K651 Peritoneal abscess: Secondary | ICD-10-CM

## 2017-10-23 DIAGNOSIS — Z951 Presence of aortocoronary bypass graft: Secondary | ICD-10-CM

## 2017-10-23 DIAGNOSIS — E7800 Pure hypercholesterolemia, unspecified: Secondary | ICD-10-CM | POA: Diagnosis not present

## 2017-10-23 DIAGNOSIS — Z9049 Acquired absence of other specified parts of digestive tract: Secondary | ICD-10-CM

## 2017-10-23 DIAGNOSIS — I252 Old myocardial infarction: Secondary | ICD-10-CM

## 2017-10-23 LAB — URINALYSIS, ROUTINE W REFLEX MICROSCOPIC
BILIRUBIN URINE: NEGATIVE
GLUCOSE, UA: NEGATIVE mg/dL
Hgb urine dipstick: NEGATIVE
KETONES UR: NEGATIVE mg/dL
NITRITE: NEGATIVE
PH: 6 (ref 5.0–8.0)
Protein, ur: 30 mg/dL — AB
Specific Gravity, Urine: 1.021 (ref 1.005–1.030)

## 2017-10-23 LAB — IRON AND TIBC
Iron: 17 ug/dL — ABNORMAL LOW (ref 28–170)
SATURATION RATIOS: 4 % — AB (ref 10.4–31.8)
TIBC: 434 ug/dL (ref 250–450)
UIBC: 417 ug/dL

## 2017-10-23 LAB — COMPREHENSIVE METABOLIC PANEL
ALT: 16 U/L (ref 14–54)
ANION GAP: 10 (ref 5–15)
AST: 19 U/L (ref 15–41)
Albumin: 3.7 g/dL (ref 3.5–5.0)
Alkaline Phosphatase: 47 U/L (ref 38–126)
BUN: 21 mg/dL — ABNORMAL HIGH (ref 6–20)
CHLORIDE: 106 mmol/L (ref 101–111)
CO2: 25 mmol/L (ref 22–32)
CREATININE: 1.01 mg/dL — AB (ref 0.44–1.00)
Calcium: 8.7 mg/dL — ABNORMAL LOW (ref 8.9–10.3)
GFR, EST NON AFRICAN AMERICAN: 53 mL/min — AB (ref >60)
Glucose, Bld: 110 mg/dL — ABNORMAL HIGH (ref 65–99)
POTASSIUM: 4.1 mmol/L (ref 3.5–5.1)
Sodium: 141 mmol/L (ref 135–145)
Total Bilirubin: 0.6 mg/dL (ref 0.3–1.2)
Total Protein: 6.5 g/dL (ref 6.5–8.1)

## 2017-10-23 LAB — CBC
HCT: 31.8 % — ABNORMAL LOW (ref 36.0–46.0)
HEMOGLOBIN: 8.6 g/dL — AB (ref 12.0–15.0)
MCH: 20.5 pg — ABNORMAL LOW (ref 26.0–34.0)
MCHC: 27 g/dL — ABNORMAL LOW (ref 30.0–36.0)
MCV: 75.7 fL — AB (ref 78.0–100.0)
PLATELETS: 295 10*3/uL (ref 150–400)
RBC: 4.2 MIL/uL (ref 3.87–5.11)
RDW: 17 % — ABNORMAL HIGH (ref 11.5–15.5)
WBC: 11.9 10*3/uL — AB (ref 4.0–10.5)

## 2017-10-23 LAB — TYPE AND SCREEN
ABO/RH(D): A POS
Antibody Screen: NEGATIVE

## 2017-10-23 LAB — RETICULOCYTES
RBC.: 4.56 MIL/uL (ref 3.87–5.11)
RETIC CT PCT: 2.2 % (ref 0.4–3.1)
Retic Count, Absolute: 100.3 10*3/uL (ref 19.0–186.0)

## 2017-10-23 LAB — BRAIN NATRIURETIC PEPTIDE: B Natriuretic Peptide: 622.9 pg/mL — ABNORMAL HIGH (ref 0.0–100.0)

## 2017-10-23 LAB — I-STAT TROPONIN, ED: Troponin i, poc: 0.01 ng/mL (ref 0.00–0.08)

## 2017-10-23 LAB — PROTIME-INR
INR: 1.04
Prothrombin Time: 13.5 seconds (ref 11.4–15.2)

## 2017-10-23 LAB — VITAMIN B12: Vitamin B-12: 101 pg/mL — ABNORMAL LOW (ref 180–914)

## 2017-10-23 LAB — I-STAT CG4 LACTIC ACID, ED
LACTIC ACID, VENOUS: 1.42 mmol/L (ref 0.5–1.9)
Lactic Acid, Venous: 1.82 mmol/L (ref 0.5–1.9)

## 2017-10-23 LAB — LIPASE, BLOOD: Lipase: 23 U/L (ref 11–51)

## 2017-10-23 LAB — FERRITIN: FERRITIN: 13 ng/mL (ref 11–307)

## 2017-10-23 LAB — FOLATE: Folate: 16.4 ng/mL (ref >5.9)

## 2017-10-23 MED ORDER — ALPRAZOLAM 0.25 MG PO TABS
0.2500 mg | ORAL_TABLET | Freq: Every day | ORAL | Status: DC | PRN
  Filled 2017-10-23: qty 1

## 2017-10-23 MED ORDER — SODIUM CHLORIDE 0.9 % IV SOLN: 250.0000 mL | INTRAVENOUS | Status: DC | PRN

## 2017-10-23 MED ORDER — ROSUVASTATIN CALCIUM 40 MG PO TABS
40.0000 mg | ORAL_TABLET | Freq: Every day | ORAL | Status: DC
  Administered 2017-10-24 – 2017-10-28 (×5): 40 mg via ORAL
  Filled 2017-10-23 (×5): qty 1

## 2017-10-23 MED ORDER — HYDRALAZINE HCL 20 MG/ML IJ SOLN
20.0000 mg | Freq: Once | INTRAMUSCULAR | Status: AC
  Administered 2017-10-23: 20 mg via INTRAVENOUS
  Filled 2017-10-23: qty 1

## 2017-10-23 MED ORDER — ACETAMINOPHEN 325 MG PO TABS: 650.0000 mg | ORAL_TABLET | Freq: Four times a day (QID) | ORAL | Status: DC | PRN

## 2017-10-23 MED ORDER — SODIUM CHLORIDE 0.9% FLUSH: 3.0000 mL | INTRAVENOUS | Status: DC | PRN

## 2017-10-23 MED ORDER — FERROUS SULFATE 325 (65 FE) MG PO TABS
325.0000 mg | ORAL_TABLET | Freq: Two times a day (BID) | ORAL | Status: DC
  Administered 2017-10-24 – 2017-10-28 (×9): 325 mg via ORAL
  Filled 2017-10-23 (×9): qty 1

## 2017-10-23 MED ORDER — ZOLPIDEM TARTRATE 5 MG PO TABS
5.0000 mg | ORAL_TABLET | Freq: Every evening | ORAL | Status: DC | PRN
  Administered 2017-10-23 – 2017-10-27 (×5): 5 mg via ORAL
  Filled 2017-10-23 (×5): qty 1

## 2017-10-23 MED ORDER — PIPERACILLIN-TAZOBACTAM 3.375 G IVPB
3.3750 g | Freq: Three times a day (TID) | INTRAVENOUS | Status: DC
  Administered 2017-10-24 – 2017-10-28 (×14): 3.375 g via INTRAVENOUS
  Filled 2017-10-23 (×15): qty 50

## 2017-10-23 MED ORDER — METOPROLOL TARTRATE 50 MG PO TABS
50.0000 mg | ORAL_TABLET | Freq: Two times a day (BID) | ORAL | Status: DC
  Administered 2017-10-23 – 2017-10-28 (×10): 50 mg via ORAL
  Filled 2017-10-23 (×10): qty 1

## 2017-10-23 MED ORDER — VERAPAMIL HCL ER 240 MG PO TBCR
240.0000 mg | EXTENDED_RELEASE_TABLET | Freq: Every evening | ORAL | Status: DC
  Administered 2017-10-23 – 2017-10-27 (×5): 240 mg via ORAL
  Filled 2017-10-23 (×5): qty 1

## 2017-10-23 MED ORDER — SODIUM CHLORIDE 0.9% FLUSH
3.0000 mL | Freq: Two times a day (BID) | INTRAVENOUS | Status: DC
  Administered 2017-10-23 – 2017-10-27 (×6): 3 mL via INTRAVENOUS

## 2017-10-23 MED ORDER — OXYCODONE HCL 5 MG PO TABS
5.0000 mg | ORAL_TABLET | ORAL | Status: DC | PRN
  Administered 2017-10-23 – 2017-10-24 (×2): 5 mg via ORAL
  Filled 2017-10-23 (×2): qty 1

## 2017-10-23 MED ORDER — ONDANSETRON HCL 4 MG PO TABS: 4.0000 mg | ORAL_TABLET | Freq: Four times a day (QID) | ORAL | Status: DC | PRN

## 2017-10-23 MED ORDER — ACETAMINOPHEN 650 MG RE SUPP: 650.0000 mg | Freq: Four times a day (QID) | RECTAL | Status: DC | PRN

## 2017-10-23 MED ORDER — FLUOXETINE HCL 20 MG PO CAPS
20.0000 mg | ORAL_CAPSULE | Freq: Every day | ORAL | Status: DC
  Administered 2017-10-24 – 2017-10-28 (×5): 20 mg via ORAL
  Filled 2017-10-23 (×5): qty 1

## 2017-10-23 MED ORDER — ALBUTEROL SULFATE HFA 108 (90 BASE) MCG/ACT IN AERS: 1.0000 | INHALATION_SPRAY | Freq: Four times a day (QID) | RESPIRATORY_TRACT | Status: DC | PRN

## 2017-10-23 MED ORDER — MINOXIDIL 2.5 MG PO TABS
2.5000 mg | ORAL_TABLET | Freq: Every day | ORAL | Status: DC
  Administered 2017-10-24 – 2017-10-28 (×5): 2.5 mg via ORAL
  Filled 2017-10-23 (×5): qty 1

## 2017-10-23 MED ORDER — ALBUTEROL SULFATE (2.5 MG/3ML) 0.083% IN NEBU
2.5000 mg | INHALATION_SOLUTION | Freq: Four times a day (QID) | RESPIRATORY_TRACT | Status: DC
  Administered 2017-10-23 – 2017-10-24 (×2): 2.5 mg via RESPIRATORY_TRACT
  Filled 2017-10-23 (×2): qty 3

## 2017-10-23 MED ORDER — ALBUTEROL SULFATE (2.5 MG/3ML) 0.083% IN NEBU: 2.5000 mg | INHALATION_SOLUTION | Freq: Four times a day (QID) | RESPIRATORY_TRACT | Status: DC | PRN

## 2017-10-23 MED ORDER — IBUPROFEN 200 MG PO TABS: 200.0000 mg | ORAL_TABLET | Freq: Four times a day (QID) | ORAL | Status: DC | PRN

## 2017-10-23 MED ORDER — CLONIDINE HCL 0.1 MG PO TABS
0.1000 mg | ORAL_TABLET | Freq: Two times a day (BID) | ORAL | Status: DC
  Administered 2017-10-23 – 2017-10-28 (×9): 0.1 mg via ORAL
  Filled 2017-10-23 (×10): qty 1

## 2017-10-23 MED ORDER — ONDANSETRON HCL 4 MG/2ML IJ SOLN: 4.0000 mg | Freq: Four times a day (QID) | INTRAMUSCULAR | Status: DC | PRN

## 2017-10-23 MED ORDER — PIPERACILLIN-TAZOBACTAM 3.375 G IVPB 30 MIN
3.3750 g | Freq: Once | INTRAVENOUS | Status: AC
  Administered 2017-10-23: 3.375 g via INTRAVENOUS
  Filled 2017-10-23: qty 50

## 2017-10-23 MED ORDER — ISOSORBIDE MONONITRATE ER 30 MG PO TB24
30.0000 mg | ORAL_TABLET | Freq: Every day | ORAL | Status: DC
  Administered 2017-10-24 – 2017-10-28 (×5): 30 mg via ORAL
  Filled 2017-10-23 (×5): qty 1

## 2017-10-23 MED ORDER — IPRATROPIUM BROMIDE 0.02 % IN SOLN
0.5000 mg | Freq: Two times a day (BID) | RESPIRATORY_TRACT | Status: DC
  Administered 2017-10-23 – 2017-10-25 (×5): 0.5 mg via RESPIRATORY_TRACT
  Filled 2017-10-23 (×6): qty 2.5

## 2017-10-23 MED ORDER — FENTANYL CITRATE (PF) 100 MCG/2ML IJ SOLN
50.0000 ug | Freq: Once | INTRAMUSCULAR | Status: AC
  Administered 2017-10-23: 50 ug via INTRAVENOUS
  Filled 2017-10-23: qty 2

## 2017-10-23 MED ORDER — DOCUSATE SODIUM 100 MG PO CAPS
100.0000 mg | ORAL_CAPSULE | Freq: Two times a day (BID) | ORAL | Status: DC
  Administered 2017-10-23 – 2017-10-24 (×3): 100 mg via ORAL
  Filled 2017-10-23 (×6): qty 1

## 2017-10-23 MED ORDER — PANTOPRAZOLE SODIUM 40 MG PO TBEC
40.0000 mg | DELAYED_RELEASE_TABLET | Freq: Every day | ORAL | Status: DC
  Administered 2017-10-24 – 2017-10-28 (×5): 40 mg via ORAL
  Filled 2017-10-23 (×5): qty 1

## 2017-10-23 MED ORDER — MOMETASONE FURO-FORMOTEROL FUM 200-5 MCG/ACT IN AERO
2.0000 | INHALATION_SPRAY | Freq: Two times a day (BID) | RESPIRATORY_TRACT | Status: DC
  Administered 2017-10-23 – 2017-10-28 (×8): 2 via RESPIRATORY_TRACT
  Filled 2017-10-23: qty 8.8

## 2017-10-23 NOTE — ED Notes (Signed)
ED Provider at bedside. 

## 2017-10-23 NOTE — ED Provider Notes (Signed)
[facility] EMERGENCY DEPARTMENT Provider Note   CSN: [id number] Arrival date & time: 10/23/17  1055     History   Chief Complaint Chief Complaint  Patient presents with  . Vaginal Bleeding  . Rectal Bleeding    HPI Joanne [name] is a 76 y.o. female.  The history is provided by the patient and medical records. No language interpreter was used.  Abdominal Pain   This is a new problem. The current episode started more than 1 week ago. The problem occurs constantly. The problem has been rapidly worsening. The pain is associated with an unknown factor. The pain is located in the suprapubic region. The quality of the pain is aching, sharp and pressure-like. The pain is at a severity of 10/10. The pain is severe. Associated symptoms include nausea, constipation and frequency. Pertinent negatives include fever, diarrhea, vomiting and dysuria. Nothing aggravates the symptoms. Nothing relieves the symptoms. Past workup includes surgery.    Past Medical History:  Diagnosis Date  . Arthritis    "all over"  . Atrial fibrillation ([place])   . Breast cancer ([place]) 1997   "left"  . Chronic diastolic CHF (congestive heart failure) ([place])    a. 08/2010 Echo: EF 65-70%, Gr 1 DD, Mild MR  . Chronic lower back pain   . COPD (chronic obstructive pulmonary disease) ([place])   . Coronary artery disease    a. 11/2009 CABGx3: LIMA->LAD, VG->RCA, VG->OM2. b. Cath 10/11/2014 EF 50-55%,  patent SVG to OM2, occlueded SVG to OM1, atretic and nonfunctional LIMA to LAD, 90% ostial LAD lesion treated with PTCA/LAD  . GERD (gastroesophageal reflux disease)   . Heart murmur   . History of blood transfusion 11/2009   "related to OHS"   . Hyperlipidemia   . Hypertension   . Left renal artery stenosis ([place])    a. Moderate L RAS  . Myocardial infarction ([place]) 2011  . Scoliosis of thoracic spine     Patient Active Problem List   Diagnosis Date Noted  . Chronic anemia 06/02/2015  . Syncope  06/01/2015  . Precordial pain   . Unstable angina ([place]) 10/11/2014  . Pain of left lower extremity- and Right 07/13/2014  . Weakness-Bilateral Arm / Leg 07/13/2014  . Pain in joint, lower leg 05/31/2013  . Abnormal sensation of leg- Left  " [place]" 05/31/2013  . Atherosclerosis of renal artery ([place]) 07/02/2012  . Chronic diastolic CHF (congestive heart failure) ([place]) 05/20/2012  . Renal artery stenosis ([place]) 12/18/2011  . Hyperlipidemia 12/18/2011  . ANEMIA 10/09/2010  . BEN HYPERTENSIVE HEART DISEASE W/HEART FAILURE 10/09/2010  . CAD 10/09/2010  . NEOPLASM, MALIGNANT, BREAST, LEFT 08/22/2008  . TOBACCO ABUSE 08/22/2008  . Essential hypertension 08/22/2008  . ALLERGIC RHINITIS 08/22/2008  . EMPHYSEMA 08/22/2008  . COPD (chronic obstructive pulmonary disease) ([place]) 08/22/2008  . PULMONARY NODULE 08/22/2008    Past Surgical History:  Procedure Laterality Date  . ABDOMINAL HYSTERECTOMY  1970's  . APPENDECTOMY  1970's  . BREAST BIOPSY Left 1997  . BREAST LUMPECTOMY Left 1997  . CARDIAC CATHETERIZATION  11/2009  . CORONARY ANGIOPLASTY WITH STENT PLACEMENT  10/11/2014   "1"  . CORONARY ARTERY BYPASS GRAFT  12/21/2009   CABGX3  Dr. Prescott Gum  . LAPAROSCOPIC CHOLECYSTECTOMY  2001  . LEFT HEART CATHETERIZATION WITH CORONARY ANGIOGRAM N/A 10/11/2014   Procedure: LEFT HEART CATHETERIZATION WITH CORONARY ANGIOGRAM;  Surgeon: Burnell Blanks, MD;  Location[facility] CATH LAB;  Service: Cardiovascular;  Laterality: N/A;  .  TONSILLECTOMY  1973  . TUBAL LIGATION  1982?    OB History    No data available       Home Medications    Prior to Admission medications   Medication Sig Start Date End Date Taking? Authorizing Provider  albuterol (PROVENTIL HFA;VENTOLIN HFA) 108 (90 BASE) MCG/ACT inhaler Inhale 1 puff into the lungs every 6 (six) hours as needed for wheezing or shortness of breath.    [provider]  albuterol (PROVENTIL) (2.5 MG/3ML) 0.083% nebulizer solution  Take 2.5 mg by nebulization 2 (two) times daily.    [provider]  ALPRAZolam Duanne Moron) 0.25 MG tablet Take 0.25 mg by mouth daily as needed. 04/27/15   [provider]  budesonide-formoterol (SYMBICORT) 160-4.5 MCG/ACT inhaler Inhale 2 puffs into the lungs 2 (two) times daily.    [provider]  cloNIDine (CATAPRES) 0.1 MG tablet Take 1 tablet (0.1 mg total) by mouth 2 (two) times daily. 10/12/17   Burnell Blanks, MD  clopidogrel (PLAVIX) 75 MG tablet Take 1 tablet (75 mg total) by mouth daily with breakfast. 10/21/16   Burnell Blanks, MD  CRESTOR 40 MG tablet TAKE 1 TABLET BY MOUTH DAILY 06/20/14   Burnell Blanks, MD  ferrous sulfate 325 (65 FE) MG tablet Take 1 tablet (325 mg total) by mouth 2 (two) times daily with a meal. 06/02/15   Regalado, Belkys A, MD  furosemide (LASIX) 40 MG tablet Take 1 tablet (40 mg total) by mouth daily. 10/31/16 01/29/17  Burnell Blanks, MD  HYDROcodone-acetaminophen (NORCO) 10-325 MG per tablet Take 1 tablet by mouth every 8 (eight) hours as needed. 06/02/15   Regalado, Belkys A, MD  ipratropium (ATROVENT) 0.02 % nebulizer solution Take 0.5 mg by nebulization 2 (two) times daily.  04/08/13   [provider]  isosorbide mononitrate (IMDUR) 30 MG 24 hr tablet Take 1 tablet (30 mg total) by mouth daily. 01/30/16   Burnell Blanks, MD  methocarbamol (ROBAXIN) 500 MG tablet 2 (two) times daily. 11/03/16   [provider]  metoprolol tartrate (LOPRESSOR) 50 MG tablet Take 1 tablet (50 mg total) by mouth 2 (two) times daily. 07/13/17   Burnell Blanks, MD  nitroGLYCERIN (NITROSTAT) 0.4 MG SL tablet Place 1 tablet (0.4 mg total) under the tongue every 5 (five) minutes as needed for chest pain. 03/31/17   Burnell Blanks, MD  pantoprazole (PROTONIX) 40 MG tablet TAKE 1 TABLET EVERY DAY 08/03/15   Burnell Blanks, MD  verapamil (COVERA HS) 240 MG (CO) 24 hr tablet Take 240 mg by mouth  at bedtime.      [provider]  zolpidem (AMBIEN) 10 MG tablet Take 10 mg by mouth at bedtime as needed. for sleep 12/27/15   [provider]    Family History Family History  Problem Relation Age of Onset  . Heart failure Mother   . Heart disease Mother        Before age 13  . Diabetes Mother   . Hyperlipidemia Mother   . Hypertension Mother   . COPD Father   . Heart disease Brother        Before age 68  . Hyperlipidemia Brother   . Hypertension Brother   . Cancer Sister        small  . Heart failure Grandchild   . Heart attack Brother   . Diabetes Brother     Social History Social History   Tobacco Use  .  Smoking status: Former Smoker    Packs/day: 2.00    Years: 50.00    Pack years: 100.00    Types: Cigarettes    Last attempt to quit: 01/01/2010    Years since quitting: 7.8  . Smokeless tobacco: Never Used  Substance Use Topics  . Alcohol use: No  . Drug use: No     Allergies   Iohexol   Review of Systems Review of Systems  Constitutional: Negative for chills, diaphoresis, fatigue and fever.  HENT: Negative for congestion and rhinorrhea.   Eyes: Negative for visual disturbance.  Respiratory: Negative for cough, chest tightness, shortness of breath and wheezing.   Cardiovascular: Negative for chest pain and palpitations.  Gastrointestinal: Positive for abdominal pain, constipation and nausea. Negative for diarrhea and vomiting.  Genitourinary: Positive for frequency, pelvic pain, vaginal bleeding and vaginal discharge. Negative for dysuria and flank pain.  Musculoskeletal: Negative for back pain, neck pain and neck stiffness.  Skin: Negative for rash and wound.  Neurological: Negative for dizziness, weakness, light-headedness and numbness.  Psychiatric/Behavioral: Negative for agitation.  All other systems reviewed and are negative.    Physical Exam Updated Vital Signs BP 137/60 (BP Location: Right Arm)   Pulse 73   Temp 98.7 F  (37.1 C)   Resp 18   Ht 5\' 2"  (1.575 m)   Wt 63.5 kg (140 lb)   SpO2 96%   BMI 25.61 kg/m   Physical Exam  Constitutional: She appears well-developed and well-nourished. No distress.  HENT:  Head: Normocephalic.  Eyes: Conjunctivae and EOM are normal. Pupils are equal, round, and reactive to light.  Neck: Normal range of motion.  Cardiovascular: Normal rate and intact distal pulses.  No murmur heard. Pulmonary/Chest: Effort normal. No stridor. No respiratory distress. She has no wheezes. She has no rales. She exhibits no tenderness.  Abdominal: Soft. Normal appearance and bowel sounds are normal. She exhibits no distension. There is tenderness in the suprapubic area. There is no rigidity, no rebound, no guarding and no CVA tenderness.    Genitourinary:  Genitourinary Comments: PT id not tolerate pelvic exam.   Musculoskeletal: She exhibits edema. She exhibits no tenderness.  Neurological: She is alert. No sensory deficit. She exhibits normal muscle tone.  Skin: Capillary refill takes less than 2 seconds. She is not diaphoretic. No erythema.  Psychiatric: She has a normal mood and affect.  Nursing note and vitals reviewed.    ED Treatments / Results  Labs (all labs ordered are listed, but only abnormal results are displayed) Labs Reviewed  COMPREHENSIVE METABOLIC PANEL - Abnormal; Notable for the following components:      Result Value   Glucose, Bld 110 (*)    BUN 21 (*)    Creatinine, Ser 1.01 (*)    Calcium 8.7 (*)    GFR calc non Af Amer 53 (*)    All other components within normal limits  CBC - Abnormal; Notable for the following components:   WBC 11.9 (*)    Hemoglobin 8.6 (*)    HCT 31.8 (*)    MCV 75.7 (*)    MCH 20.5 (*)    MCHC 27.0 (*)    RDW 17.0 (*)    All other components within normal limits  BRAIN NATRIURETIC PEPTIDE - Abnormal; Notable for the following components:   B Natriuretic Peptide 622.9 (*)    All other components within normal limits    URINALYSIS, ROUTINE W REFLEX MICROSCOPIC - Abnormal; Notable for the following components:  APPearance HAZY (*)    Protein, ur 30 (*)    Leukocytes, UA LARGE (*)    Bacteria, UA FEW (*)    Squamous Epithelial / LPF 0-5 (*)    All other components within normal limits  VITAMIN B12 - Abnormal; Notable for the following components:   Vitamin B-12 101 (*)    All other components within normal limits  IRON AND TIBC - Abnormal; Notable for the following components:   Iron 17 (*)    Saturation Ratios 4 (*)    All other components within normal limits  URINE CULTURE  CULTURE, BLOOD (ROUTINE X 2)  CULTURE, BLOOD (ROUTINE X 2)  LIPASE, BLOOD  PROTIME-INR  FOLATE  FERRITIN  RETICULOCYTES  BASIC METABOLIC PANEL  CBC  PROTIME-INR  I-STAT CG4 LACTIC ACID, ED  I-STAT TROPONIN, ED  I-STAT CG4 LACTIC ACID, ED  TYPE AND SCREEN    EKG  EKG Interpretation  Date/Time:  Friday October 23 2017 15:23:35 EST Ventricular Rate:  77 PR Interval:    QRS Duration: 142 QT Interval:  457 QTC Calculation: 518 R Axis:   49 Text Interpretation:  Sinus rhythm Right bundle branch block When compared to prior, no significant changes seen.  No STEMI Confirmed by Antony Blackbird [phone]) on 10/23/2017 6:18:24 PM       Radiology Ct Abdomen Pelvis Wo Contrast  Result Date: 10/23/2017 CLINICAL DATA:  76 year old with vaginal bleeding and black stool for 1 month. History of appendectomy, hysterectomy and cholecystectomy. EXAM: CT ABDOMEN AND PELVIS WITHOUT CONTRAST TECHNIQUE: Multidetector CT imaging of the abdomen and pelvis was performed following the standard protocol without IV contrast. COMPARISON:  Abdominopelvic CT 02/13/2012.  Lumbar MRI 09/21/2016. FINDINGS: Lower chest: 5 mm subpleural right lower lobe nodule on image number 11 (best seen on coronal image 132), not clearly seen before, but unlikely to be clinically significant. The lung bases are otherwise clear. There are mild emphysematous  changes. Dense mitral annular and possible valvular calcifications. There is aortic atherosclerosis and a small left pleural effusion. Hepatobiliary: As evaluated in the noncontrast state, the liver appears stable without focal abnormality. No significant biliary dilatation status post cholecystectomy. Pancreas: Unremarkable. No pancreatic ductal dilatation or surrounding inflammatory changes. Spleen: Normal in size without focal abnormality. Adrenals/Urinary Tract: Both adrenal glands appear normal. Both kidneys appear stable without evidence of urinary tract calculus or hydronephrosis. There is no perinephric soft tissue stranding. The ureters and bladder appear unremarkable. There is no gas in the bladder lumen. Stomach/Bowel: No enteric contrast was administered. The stomach, small bowel and proximal colon appear normal. There are diverticular changes throughout the distal colon with sigmoid colon wall thickening and mild surrounding inflammation. Superior to the sigmoid colon, there is a tubular shaped fluid collection which measures up to 8.5 cm in length on axial image 58 and 3.0 cm transverse on image 56. There is a poor fat plane between the inflamed sigmoid colon and the vaginal cuff on the left, best seen on the coronal and sagittal images. Vascular/Lymphatic: There are no enlarged abdominal or pelvic lymph nodes. Diffuse aortic and branch vessel atherosclerosis again noted. There is a stable 2.7 cm mid abdominal aortic aneurysm, best seen on coronal image 74. There are chronically displaced intimal calcifications within the aorta. No retroperitoneal hematoma. Reproductive: Hysterectomy. Possible residual ovarian tissue bilaterally, stable. No adnexal mass. As above, there is a poor fat plane between the inflamed sigmoid colon and vaginal cuff on the left. There is some gas within the vagina.  Other: There is no generalized ascites or free intraperitoneal air. No other extraluminal fluid collections are  seen. Musculoskeletal: No acute or significant osseous findings. Degenerative changes in the spine associated with a thoracolumbar scoliosis. IMPRESSION: 1. Sigmoid diverticulitis with adjacent tubular fluid collection suspicious for contained perforation and pelvic abscess. 2. Poor fat plane between the inflamed sigmoid colon and the vaginal cuff on the left. There is gas in the vagina. 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 Surgical consultation recommended. Follow-up CT with oral or rectal contrast, and intravenous contrast if possible, may be helpful to better evaluate the pelvic findings. 3. Small left pleural effusion. 4.  Aortic Atherosclerosis (ICD10-I70.0). Electronically Signed   By: Richardean Sale M.D.   On: 10/23/2017 17:13   Dg Chest 2 View  Result Date: 10/23/2017 CLINICAL DATA:  Increasing shortness of breath EXAM: CHEST  2 VIEW COMPARISON:  10/28/2016,  06/01/2005 FINDINGS: Post sternotomy changes. Postsurgical changes in the left axillary region. Hyperinflation. Diffuse left greater than right interstitial opacity, slightly more prominent compared to previous. Vague focal opacity in the left upper lung. Cardiomegaly with aortic atherosclerosis. No pneumothorax. Multiple compression deformities of the spine IMPRESSION: 1. Hyperinflation. Mild diffuse left greater than right interstitial opacities somewhat more prominent compared to prior, findings could represent mild acute edema or inflammatory process on chronic change. A vague focal opacities seen in the left upper lung and may represent a small focus of pulmonary infiltrate. Short interval radiographic follow-up recommended 2. Cardiomegaly Electronically Signed   By: Donavan Foil M.D.   On: 10/23/2017 16:08    Procedures Procedures (including critical care time)  CRITICAL CARE Performed by: Gwenyth Allegra Tegeler Total critical care time: 35 minutes Critical care time was  exclusive of separately billable procedures and treating other patients. Diverticulitis with abscess and fistula into vagina needing IV ABX and surgery consult.  Critical care was necessary to treat or prevent imminent or life-threatening deterioration. Critical care was time spent personally by me on the following activities: development of treatment plan with patient and/or surrogate as well as nursing, discussions with consultants, evaluation of patient's response to treatment, examination of patient, obtaining history from patient or surrogate, ordering and performing treatments and interventions, ordering and review of laboratory studies, ordering and review of radiographic studies, pulse oximetry and re-evaluation of patient's condition.   Medications Ordered in ED Medications  verapamil (CALAN-SR) CR tablet 240 mg (240 mg Oral Given 10/23/17 2245)  ipratropium (ATROVENT) nebulizer solution 0.5 mg (0.5 mg Nebulization Given 10/23/17 2106)  rosuvastatin (CRESTOR) tablet 40 mg (not administered)  albuterol (PROVENTIL) (2.5 MG/3ML) 0.083% nebulizer solution 2.5 mg (2.5 mg Nebulization Given 10/23/17 2106)  ALPRAZolam (XANAX) tablet 0.25 mg (not administered)  ferrous sulfate tablet 325 mg (not administered)  pantoprazole (PROTONIX) EC tablet 40 mg (not administered)  isosorbide mononitrate (IMDUR) 24 hr tablet 30 mg (not administered)  metoprolol tartrate (LOPRESSOR) tablet 50 mg (not administered)  cloNIDine (CATAPRES) tablet 0.1 mg (0.1 mg Oral Given 10/23/17 2053)  ibuprofen (ADVIL,MOTRIN) tablet 200-400 mg (not administered)  minoxidil (LONITEN) tablet 2.5 mg (not administered)  FLUoxetine (PROZAC) capsule 20 mg (not administered)  mometasone-formoterol (DULERA) 200-5 MCG/ACT inhaler 2 puff (2 puffs Inhalation Given 10/23/17 2104)  zolpidem (AMBIEN) tablet 5 mg (not administered)  sodium chloride flush (NS) 0.9 % injection 3 mL (not administered)  sodium chloride flush (NS) 0.9 %  injection 3 mL (not administered)  0.9 %  sodium chloride infusion (not administered)  acetaminophen (  TYLENOL) tablet 650 mg (not administered)    Or  acetaminophen (TYLENOL) suppository 650 mg (not administered)  oxyCODONE (Oxy IR/ROXICODONE) immediate release tablet 5 mg (5 mg Oral Given 10/23/17 2245)  docusate sodium (COLACE) capsule 100 mg (100 mg Oral Given 10/23/17 2055)  ondansetron (ZOFRAN) tablet 4 mg (not administered)    Or  ondansetron (ZOFRAN) injection 4 mg (not administered)  albuterol (PROVENTIL) (2.5 MG/3ML) 0.083% nebulizer solution 2.5 mg (not administered)  piperacillin-tazobactam (ZOSYN) IVPB 3.375 g (not administered)  fentaNYL (SUBLIMAZE) injection 50 mcg (50 mcg Intravenous Given 10/23/17 1819)  piperacillin-tazobactam (ZOSYN) IVPB 3.375 g (3.375 g Intravenous New Bag/Given 10/23/17 1857)  hydrALAZINE (APRESOLINE) injection 20 mg (20 mg Intravenous Given 10/23/17 1857)     Initial Impression / Assessment and Plan / ED Course  I have reviewed the triage vital signs and the nursing notes.  Pertinent labs & imaging results that were available during my care of the patient were reviewed by me and considered in my medical decision making (see chart for details).     Joanne [name] is a 76 y.o. female with a past medical history significant for breast cancer, hypertension, COPD, CHF, CAD status post CABG, atrial fibrillation, GERD, diverticulitis and multiple prior abdominal surgeries including appendectomy, cholecystectomy, and hysterectomy who presents with abdominal pain, vaginal bleeding, dark stools, and passing gas from her vagina.  Patient says that over the last several weeks, she has had some intermittent abdominal pains.  She reports that has worsened over the last several days and severe.  It is an 8 out of 10 across her lower abdomen.  She reports no fevers but has had some subjective chills.  She reports some mild nausea but no vomiting.  She denies any cough,  congestion, or shortness of breath at this time.  She says that she has had some vaginal bleeding on and off for the last 4 weeks.  She reports that she is occasionally had some dark stools but has not had frank blood from her rectum.  She has not taken medicine to help her symptoms.  Patient does take Plavix for her stent.  Patient says that she has had some mild bilateral leg edema recently.  Patient says that she has been passing gas from her vagina and has had some stool-like discharge along with the blood in her vagina.  Patient denies any histories of fistulas.  On exam, patient had severe lower abdominal tenderness.  Lungs were overall clear.  No significant wheezing.  Legs had very mild edema.  Back was slightly tender in the lateral areas of the lumbar spine.  Pelvic exam was attempted due to the vaginal bleeding however, patient did not tolerate speculum exam with severe pain.    Laboratory testing showed elevated BNP of 622.  Patient does not have prior BNP is to compare.  Patient has mild leukocytosis of 11.9 and mild anemia of 8.6 down from 9.5 1-year ago.  Urinalysis shows leukocytes and bacteria, in the setting of the foul-smelling urine, patient likely has urinary tract infection.  However, CT scan shows concern for diverticulitis with perforation, abscess, and possible fistulization with the vagina.  General surgery will be called and patient was started on broad-spectrum antibiotics.  Cultures were obtained.  Anticipate evaluation by general surgery and admission for further management.  5:59 PM General surgery will come see the patient but they requested patient be admitted to the hospital service given the amount of other medical problems that the patient has  as well as her current diverticulitis.    Hospitalist team will be called.  Final Clinical Impressions(s) / ED Diagnoses   Final diagnoses:  Diverticulitis  Intra-abdominal abscess ([place])  Diverticulitis of large intestine  with abscess, unspecified bleeding status    Clinical Impression: 1. Diverticulitis   2. Intra-abdominal abscess ([place])   3. Diverticulitis of large intestine with abscess, unspecified bleeding status     Disposition: Admit  This note was prepared with assistance of Dragon voice recognition software. Occasional wrong-word or sound-a-like substitutions may have occurred due to the inherent limitations of voice recognition software.     Tegeler, Gwenyth Allegra, MD 10/23/17 2250

## 2017-10-23 NOTE — ED Triage Notes (Signed)
Pt. Stated, Im having vaginal bleeding for a month. Im also having black stool and then normal and them it will be black again. Started a month ago.

## 2017-10-23 NOTE — Progress Notes (Signed)
Patient received zosyn x1 in the ED This was auto-verified  Spoke with RN when patient got to the floor, patient is not having any signs nor symptoms of allergy, rash or anaphylaxis, therefore will continue with zosyn order. Zosyn 3.375 g IV q8h

## 2017-10-23 NOTE — ED Notes (Signed)
Patient transported to CT 

## 2017-10-23 NOTE — H&P (Signed)
History and Physical    [name] WIO:[id number] DOB: 09-26-1941 DOA: 10/23/2017  PCP: Leanna Battles, MD  Patient coming from:  home  Chief Complaint:  Abdominal pain, discharge and feces coming through vagina  HPI: Joanne [name] is a 76 y.o. female with medical history significant of CHF, CAD/CABG, Afib, copd, not on home supplemental oxygen, GERD, previous diverticulitis about 3 months resolved clinically with oral abx, comes in with over a month of lower abdominal pain which has progressed to discharge and fecal material from her vagina for over 3 weeks now.  Pt has seen her PCP during this time but did not discuss her acute issues with him because she was concerned that she has already had too much surgery.  Denies any fevers, but having chills.  No n/v/d.  Pt found to have diverticulitis with absess and air in the vagina likely a fistula.  General surgery has been called and will evaluate the pt.  Pt referred for admission for iv abx and surgical evaluation.  Review of Systems: As per HPI otherwise 10 point review of systems negative.   Past Medical History:  Diagnosis Date  . Arthritis    "all over"  . Atrial fibrillation ([place])   . Breast cancer ([place]) 1997   "left"  . Chronic diastolic CHF (congestive heart failure) ([place])    a. 08/2010 Echo: EF 65-70%, Gr 1 DD, Mild MR  . Chronic lower back pain   . COPD (chronic obstructive pulmonary disease) ([place])   . Coronary artery disease    a. 11/2009 CABGx3: LIMA->LAD, VG->RCA, VG->OM2. b. Cath 10/11/2014 EF 50-55%,  patent SVG to OM2, occlueded SVG to OM1, atretic and nonfunctional LIMA to LAD, 90% ostial LAD lesion treated with PTCA/LAD  . GERD (gastroesophageal reflux disease)   . Heart murmur   . History of blood transfusion 11/2009   "related to OHS"   . Hyperlipidemia   . Hypertension   . Left renal artery stenosis ([place])    a. Moderate L RAS  . Myocardial infarction ([place]) 2011  . Scoliosis of thoracic spine     Past  Surgical History:  Procedure Laterality Date  . ABDOMINAL HYSTERECTOMY  1970's  . APPENDECTOMY  1970's  . BREAST BIOPSY Left 1997  . BREAST LUMPECTOMY Left 1997  . CARDIAC CATHETERIZATION  11/2009  . CORONARY ANGIOPLASTY WITH STENT PLACEMENT  10/11/2014   "1"  . CORONARY ARTERY BYPASS GRAFT  12/21/2009   CABGX3  Dr. Prescott Gum  . LAPAROSCOPIC CHOLECYSTECTOMY  2001  . LEFT HEART CATHETERIZATION WITH CORONARY ANGIOGRAM N/A 10/11/2014   Procedure: LEFT HEART CATHETERIZATION WITH CORONARY ANGIOGRAM;  Surgeon: Burnell Blanks, MD;  Location: [facility] CATH LAB;  Service: Cardiovascular;  Laterality: N/A;  . TONSILLECTOMY  1973  . TUBAL LIGATION  1982?     reports that she quit smoking about 7 years ago. Her smoking use included cigarettes. She has a 100.00 pack-year smoking history. she has never used smokeless tobacco. She reports that she does not drink alcohol or use drugs.  Allergies  Allergen Reactions  . Iohexol Anaphylaxis, Shortness Of Breath and Swelling     Code: SOB, Desc: ct @ ser w/ throat swelling & sob, ok w/ 13 hr prep//a.c., Onset Date: [id number]   . Penicillins Anaphylaxis    Has patient had a PCN reaction causing immediate rash, facial/tongue/throat swelling, SOB or lightheadedness with hypotension: Yes Has patient had a PCN reaction causing severe rash involving mucus membranes or skin necrosis: No  Has patient had a PCN reaction that required hospitalization: No Has patient had a PCN reaction occurring within the last 10 years: No If all of the above answers are "NO", then may proceed with Cephalosporin use.     Family History  Problem Relation Age of Onset  . Heart failure Mother   . Heart disease Mother        Before age 85  . Diabetes Mother   . Hyperlipidemia Mother   . Hypertension Mother   . COPD Father   . Heart disease Brother        Before age 46  . Hyperlipidemia Brother   . Hypertension Brother   . Cancer Sister        small  . Heart failure  Grandchild   . Heart attack Brother   . Diabetes Brother     Prior to Admission medications   Medication Sig Start Date End Date Taking? Authorizing Provider  albuterol (PROVENTIL HFA;VENTOLIN HFA) 108 (90 BASE) MCG/ACT inhaler Inhale 1 puff into the lungs every 6 (six) hours as needed for wheezing or shortness of breath.   Yes [provider]  albuterol (PROVENTIL) (2.5 MG/3ML) 0.083% nebulizer solution Take 2.5 mg by nebulization 4 (four) times daily.    Yes [provider]  ALPRAZolam (XANAX) 0.25 MG tablet Take 0.25 mg by mouth daily as needed for anxiety.  04/27/15  Yes [provider]  budesonide-formoterol (SYMBICORT) 160-4.5 MCG/ACT inhaler Inhale 2 puffs into the lungs 2 (two) times daily.   Yes [provider]  cloNIDine (CATAPRES) 0.1 MG tablet Take 1 tablet (0.1 mg total) by mouth 2 (two) times daily. 10/12/17  Yes Burnell Blanks, MD  clopidogrel (PLAVIX) 75 MG tablet Take 1 tablet (75 mg total) by mouth daily with breakfast. 10/21/16  Yes McAlhany, Annita Brod, MD  CRESTOR 40 MG tablet TAKE 1 TABLET BY MOUTH DAILY Patient taking differently: Take 40 mg by mouth once a day 06/20/14  Yes Burnell Blanks, MD  ferrous sulfate 325 (65 FE) MG tablet Take 1 tablet (325 mg total) by mouth 2 (two) times daily with a meal. Patient taking differently: Take 325 mg by mouth daily.  06/02/15  Yes Regalado, Belkys A, MD  FLUoxetine (PROZAC) 20 MG capsule Take 20 mg by mouth daily.   Yes [provider]  furosemide (LASIX) 40 MG tablet Take 1 tablet (40 mg total) by mouth daily. Patient taking differently: Take 40 mg by mouth daily as needed for fluid or edema.  10/31/16 10/23/17 Yes Burnell Blanks, MD  ibuprofen (ADVIL,MOTRIN) 200 MG tablet Take 200-400 mg by mouth every 6 (six) hours as needed (for pain or headaches).   Yes [provider]  ipratropium (ATROVENT) 0.02 % nebulizer solution Take 0.5 mg by nebulization 2  (two) times daily.  04/08/13  Yes [provider]  isosorbide mononitrate (IMDUR) 30 MG 24 hr tablet Take 1 tablet (30 mg total) by mouth daily. 01/30/16  Yes Burnell Blanks, MD  metoprolol tartrate (LOPRESSOR) 50 MG tablet Take 1 tablet (50 mg total) by mouth 2 (two) times daily. 07/13/17  Yes Burnell Blanks, MD  minoxidil (LONITEN) 2.5 MG tablet Take 2.5 mg by mouth daily. 07/20/17  Yes [provider]  nitroGLYCERIN (NITROSTAT) 0.4 MG SL tablet Place 1 tablet (0.4 mg total) under the tongue every 5 (five) minutes as needed for chest pain. Patient taking differently: Place 0.4 mg under the tongue every morning.  03/31/17  Yes  Burnell Blanks, MD  pantoprazole (PROTONIX) 40 MG tablet TAKE 1 TABLET EVERY DAY Patient taking differently: Take 40 mg by mouth once a day 08/03/15  Yes Burnell Blanks, MD  predniSONE (DELTASONE) 20 MG tablet Take 20 mg by mouth daily. FOR 7 DAYS 10/13/17  Yes [provider]  verapamil (COVERA HS) 240 MG (CO) 24 hr tablet Take 240 mg by mouth every evening.    Yes [provider]  zolpidem (AMBIEN) 10 MG tablet Take 10 mg by mouth at bedtime as needed for sleep.  12/27/15  Yes [provider]  HYDROcodone-acetaminophen (NORCO) 10-325 MG per tablet Take 1 tablet by mouth every 8 (eight) hours as needed. Patient not taking: Reported on 10/23/2017 06/02/15   Elmarie Shiley, MD    Physical Exam: Vitals:   10/23/17 1117 10/23/17 1357 10/23/17 1545 10/23/17 1715  BP: (!) 155/93 137/60 (!) 178/88 (!) 179/74  Pulse: 70 73 70 72  Resp: 17 18 19 18   Temp: 98.7 F (37.1 C)     SpO2: 98% 96% 95% 98%  Weight: 63.5 kg (140 lb)     Height: 5\' 2"  (1.575 m)         Constitutional: NAD, calm, comfortable Vitals:   10/23/17 1117 10/23/17 1357 10/23/17 1545 10/23/17 1715  BP: (!) 155/93 137/60 (!) 178/88 (!) 179/74  Pulse: 70 73 70 72  Resp: 17 18 19 18   Temp: 98.7 F (37.1 C)     SpO2: 98% 96% 95%  98%  Weight: 63.5 kg (140 lb)     Height: 5\' 2"  (1.575 m)      Eyes: PERRL, lids and conjunctivae normal ENMT: Mucous membranes are moist. Posterior pharynx clear of any exudate or lesions.Normal dentition.  Neck: normal, supple, no masses, no thyromegaly Respiratory: clear to auscultation bilaterally, no wheezing, no crackles. Normal respiratory effort. No accessory muscle use.  Cardiovascular: Regular rate and rhythm, no murmurs / rubs / gallops. No extremity edema. 2+ pedal pulses. No carotid bruits.  Abdomen: no tenderness, no masses palpated. No hepatosplenomegaly. Bowel sounds positive.  Musculoskeletal: no clubbing / cyanosis. No joint deformity upper and lower extremities. Good ROM, no contractures. Normal muscle tone.  Skin: no rashes, lesions, ulcers. No induration Neurologic: CN 2-12 grossly intact. Sensation intact, DTR normal. Strength 5/5 in all 4.  Psychiatric: Normal judgment and insight. Alert and oriented x 3. Normal mood.    Labs on Admission: I have personally reviewed following labs and imaging studies  CBC: Recent Labs  Lab 10/23/17 1133  WBC 11.9*  HGB 8.6*  HCT 31.8*  MCV 75.7*  PLT 161   Basic Metabolic Panel: Recent Labs  Lab 10/23/17 1133  NA 141  K 4.1  CL 106  CO2 25  GLUCOSE 110*  BUN 21*  CREATININE 1.01*  CALCIUM 8.7*   GFR: Estimated Creatinine Clearance: 42.2 mL/min (A) (by C-G formula based on SCr of 1.01 mg/dL (H)). Liver Function Tests: Recent Labs  Lab 10/23/17 1133  AST 19  ALT 16  ALKPHOS 47  BILITOT 0.6  PROT 6.5  ALBUMIN 3.7   Recent Labs  Lab 10/23/17 1508  LIPASE 23   No results for input(s): AMMONIA in the last 168 hours. Coagulation Profile: Recent Labs  Lab 10/23/17 1508  INR 1.04   Cardiac Enzymes: No results for input(s): CKTOTAL, CKMB, CKMBINDEX, TROPONINI in the last 168 hours. BNP (last 3 results) No results for input(s): PROBNP in the last 8760 hours. HbA1C: No results for  input(s): HGBA1C in  the last 72 hours. CBG: No results for input(s): GLUCAP in the last 168 hours. Lipid Profile: No results for input(s): CHOL, HDL, LDLCALC, TRIG, CHOLHDL, LDLDIRECT in the last 72 hours. Thyroid Function Tests: No results for input(s): TSH, T4TOTAL, FREET4, T3FREE, THYROIDAB in the last 72 hours. Anemia Panel: No results for input(s): VITAMINB12, FOLATE, FERRITIN, TIBC, IRON, RETICCTPCT in the last 72 hours. Urine analysis:    Component Value Date/Time   COLORURINE YELLOW 10/23/2017 1517   APPEARANCEUR HAZY (A) 10/23/2017 1517   LABSPEC 1.021 10/23/2017 1517   PHURINE 6.0 10/23/2017 1517   GLUCOSEU NEGATIVE 10/23/2017 1517   HGBUR NEGATIVE 10/23/2017 1517   BILIRUBINUR NEGATIVE 10/23/2017 1517   KETONESUR NEGATIVE 10/23/2017 1517   PROTEINUR 30 (A) 10/23/2017 1517   UROBILINOGEN 1.0 01/21/2010 1600   NITRITE NEGATIVE 10/23/2017 1517   LEUKOCYTESUR LARGE (A) 10/23/2017 1517   Sepsis Labs: !!!!!!!!!!!!!!!!!!!!!!!!!!!!!!!!!!!!!!!!!!!! @LABRCNTIP (procalcitonin:4,lacticidven:4) )No results found for this or any previous visit (from the past 240 hour(s)).   Radiological Exams on Admission: Ct Abdomen Pelvis Wo Contrast  Result Date: 10/23/2017 CLINICAL DATA:  76 year old with vaginal bleeding and black stool for 1 month. History of appendectomy, hysterectomy and cholecystectomy. EXAM: CT ABDOMEN AND PELVIS WITHOUT CONTRAST TECHNIQUE: Multidetector CT imaging of the abdomen and pelvis was performed following the standard protocol without IV contrast. COMPARISON:  Abdominopelvic CT 02/13/2012.  Lumbar MRI 09/21/2016. FINDINGS: Lower chest: 5 mm subpleural right lower lobe nodule on image number 11 (best seen on coronal image 132), not clearly seen before, but unlikely to be clinically significant. The lung bases are otherwise clear. There are mild emphysematous changes. Dense mitral annular and possible valvular calcifications. There is aortic atherosclerosis and a small left pleural  effusion. Hepatobiliary: As evaluated in the noncontrast state, the liver appears stable without focal abnormality. No significant biliary dilatation status post cholecystectomy. Pancreas: Unremarkable. No pancreatic ductal dilatation or surrounding inflammatory changes. Spleen: Normal in size without focal abnormality. Adrenals/Urinary Tract: Both adrenal glands appear normal. Both kidneys appear stable without evidence of urinary tract calculus or hydronephrosis. There is no perinephric soft tissue stranding. The ureters and bladder appear unremarkable. There is no gas in the bladder lumen. Stomach/Bowel: No enteric contrast was administered. The stomach, small bowel and proximal colon appear normal. There are diverticular changes throughout the distal colon with sigmoid colon wall thickening and mild surrounding inflammation. Superior to the sigmoid colon, there is a tubular shaped fluid collection which measures up to 8.5 cm in length on axial image 58 and 3.0 cm transverse on image 56. There is a poor fat plane between the inflamed sigmoid colon and the vaginal cuff on the left, best seen on the coronal and sagittal images. Vascular/Lymphatic: There are no enlarged abdominal or pelvic lymph nodes. Diffuse aortic and branch vessel atherosclerosis again noted. There is a stable 2.7 cm mid abdominal aortic aneurysm, best seen on coronal image 74. There are chronically displaced intimal calcifications within the aorta. No retroperitoneal hematoma. Reproductive: Hysterectomy. Possible residual ovarian tissue bilaterally, stable. No adnexal mass. As above, there is a poor fat plane between the inflamed sigmoid colon and vaginal cuff on the left. There is some gas within the vagina. Other: There is no generalized ascites or free intraperitoneal air. No other extraluminal fluid collections are seen. Musculoskeletal: No acute or significant osseous findings. Degenerative changes in the spine associated with a  thoracolumbar scoliosis. IMPRESSION: 1. Sigmoid diverticulitis with adjacent tubular fluid collection suspicious for contained perforation and pelvic  abscess. 2. Poor fat plane between the inflamed sigmoid colon and the vaginal cuff on the left. There is gas in the vagina. 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 Surgical consultation recommended. Follow-up CT with oral or rectal contrast, and intravenous contrast if possible, may be helpful to better evaluate the pelvic findings. 3. Small left pleural effusion. 4.  Aortic Atherosclerosis (ICD10-I70.0). Electronically Signed   By: Richardean Sale M.D.   On: 10/23/2017 17:13   Dg Chest 2 View  Result Date: 10/23/2017 CLINICAL DATA:  Increasing shortness of breath EXAM: CHEST  2 VIEW COMPARISON:  10/28/2016,  06/01/2005 FINDINGS: Post sternotomy changes. Postsurgical changes in the left axillary region. Hyperinflation. Diffuse left greater than right interstitial opacity, slightly more prominent compared to previous. Vague focal opacity in the left upper lung. Cardiomegaly with aortic atherosclerosis. No pneumothorax. Multiple compression deformities of the spine IMPRESSION: 1. Hyperinflation. Mild diffuse left greater than right interstitial opacities somewhat more prominent compared to prior, findings could represent mild acute edema or inflammatory process on chronic change. A vague focal opacities seen in the left upper lung and may represent a small focus of pulmonary infiltrate. Short interval radiographic follow-up recommended 2. Cardiomegaly Electronically Signed   By: Donavan Foil M.D.   On: 10/23/2017 16:08    EKG: Independently reviewed. rbbb old c/w old Old chart reviewed Case discussed with dr tegeler in ED  Assessment/Plan 76 yo female with over a month of symptoms found to have complicated acute diverticulitis  Principal Problem:   Diverticulitis of large intestine with complication-  lactic nml.  Vitals normal.  Pt looks surprising well despite her ct results and symptoms for over a month now.  General surgery dr Kieth Brightly to see.  Keep npo x meds.  Hold aspirin products and anticoagulants for now until any surgical plan clearer.  Iv zosyn per pharm to consult and dose.  abd exam is benign.  Active Problems:   Essential hypertension- give evening meds now.  Iv hydralazine ordered now too.     COPD (chronic obstructive pulmonary disease) ([facility])- stable, cont home inhalers   Renal artery stenosis ([place])- noted   Hyperlipidemia- cont statin   Chronic diastolic CHF (congestive heart failure) ([place])- stable at this time.  Will hold off on ivf at this time and hold lasix until surgical plan clear.  euvolemic on exam.   Atherosclerosis of renal artery ([place])- noted   Chronic anemia- ck anemia panel.  Noted.  She notes possible some blood from the vagina.  ED unable to do pelvic exam due to pain and pt did not tolerate.  Monitor close for bleeding.     DVT prophylaxis:  scds for now Code Status:  full Family Communication:  sons Disposition Plan:  Per day team Consults called: general surgery Admission status:   admission   Taniya Dasher A MD [facility]  If 7PM-7AM, please contact night-coverage [email or web address] Password [facility]  10/23/2017, 6:44 PM

## 2017-10-24 ENCOUNTER — Other Ambulatory Visit: Payer: Self-pay

## 2017-10-24 ENCOUNTER — Encounter (HOSPITAL_COMMUNITY): Payer: Self-pay | Admitting: Physician Assistant

## 2017-10-24 DIAGNOSIS — K5720 Diverticulitis of large intestine with perforation and abscess without bleeding: Principal | ICD-10-CM

## 2017-10-24 DIAGNOSIS — N824 Other female intestinal-genital tract fistulae: Secondary | ICD-10-CM

## 2017-10-24 DIAGNOSIS — K5792 Diverticulitis of intestine, part unspecified, without perforation or abscess without bleeding: Secondary | ICD-10-CM

## 2017-10-24 DIAGNOSIS — E7800 Pure hypercholesterolemia, unspecified: Secondary | ICD-10-CM

## 2017-10-24 DIAGNOSIS — K651 Peritoneal abscess: Secondary | ICD-10-CM

## 2017-10-24 LAB — CBC
HCT: 29.2 % — ABNORMAL LOW (ref 36.0–46.0)
Hemoglobin: 7.7 g/dL — ABNORMAL LOW (ref 12.0–15.0)
MCH: 19.5 pg — ABNORMAL LOW (ref 26.0–34.0)
MCHC: 26.4 g/dL — AB (ref 30.0–36.0)
MCV: 73.9 fL — AB (ref 78.0–100.0)
PLATELETS: 277 10*3/uL (ref 150–400)
RBC: 3.95 MIL/uL (ref 3.87–5.11)
RDW: 16.7 % — AB (ref 11.5–15.5)
WBC: 8.8 10*3/uL (ref 4.0–10.5)

## 2017-10-24 LAB — PROTIME-INR
INR: 1.09
Prothrombin Time: 14 seconds (ref 11.4–15.2)

## 2017-10-24 LAB — BASIC METABOLIC PANEL
Anion gap: 8 (ref 5–15)
BUN: 20 mg/dL (ref 6–20)
CHLORIDE: 106 mmol/L (ref 101–111)
CO2: 23 mmol/L (ref 22–32)
CREATININE: 1.02 mg/dL — AB (ref 0.44–1.00)
Calcium: 8.5 mg/dL — ABNORMAL LOW (ref 8.9–10.3)
GFR calc Af Amer: >60 mL/min (ref >60)
GFR calc non Af Amer: 52 mL/min — ABNORMAL LOW (ref >60)
GLUCOSE: 89 mg/dL (ref 65–99)
Potassium: 3.9 mmol/L (ref 3.5–5.1)
SODIUM: 137 mmol/L (ref 135–145)

## 2017-10-24 LAB — URINE CULTURE

## 2017-10-24 LAB — SURGICAL PCR SCREEN
MRSA, PCR: NEGATIVE
Staphylococcus aureus: NEGATIVE

## 2017-10-24 MED ORDER — TRAMADOL HCL 50 MG PO TABS
50.0000 mg | ORAL_TABLET | Freq: Four times a day (QID) | ORAL | Status: DC | PRN
Start: 2017-10-24 — End: 2017-10-28
  Administered 2017-10-24 – 2017-10-28 (×12): 50 mg via ORAL
  Filled 2017-10-24 (×12): qty 1

## 2017-10-24 MED ORDER — ORAL CARE MOUTH RINSE
15.0000 mL | Freq: Two times a day (BID) | OROMUCOSAL | Status: DC
  Administered 2017-10-24 – 2017-10-26 (×5): 15 mL via OROMUCOSAL

## 2017-10-24 MED ORDER — INFLUENZA VAC SPLIT HIGH-DOSE 0.5 ML IM SUSY
0.5000 mL | PREFILLED_SYRINGE | INTRAMUSCULAR | Status: AC
  Administered 2017-10-25: 0.5 mL via INTRAMUSCULAR
  Filled 2017-10-24: qty 0.5

## 2017-10-24 MED ORDER — ALBUTEROL SULFATE (2.5 MG/3ML) 0.083% IN NEBU
2.5000 mg | INHALATION_SOLUTION | Freq: Four times a day (QID) | RESPIRATORY_TRACT | Status: DC
  Administered 2017-10-24 – 2017-10-25 (×5): 2.5 mg via RESPIRATORY_TRACT
  Filled 2017-10-24 (×6): qty 3

## 2017-10-24 MED ORDER — PNEUMOCOCCAL VAC POLYVALENT 25 MCG/0.5ML IJ INJ
0.5000 mL | INJECTION | INTRAMUSCULAR | Status: AC
  Administered 2017-10-25: 0.5 mL via INTRAMUSCULAR
  Filled 2017-10-24: qty 0.5

## 2017-10-24 NOTE — H&P (Signed)
Chief Complaint: Patient was seen in consultation today for  Chief Complaint  Patient presents with  . Vaginal Bleeding  . Rectal Bleeding     Referring Physician(s): Arta Bruce Kinsinger MD  Supervising Physician: Corrie Mckusick  Patient Status: [facility] - In-pt  History of Present Illness: Joanne [name] is a 76 y.o. female with a medical history of CHF, CAD/CABG, Afib, COPD, GERD, and previous bout of diverticulitis about 3 months ago which resolved clinically with oral antibiotics.  She presented to the ED yesterday complaining of over a month of lower abdominal pain which has progressed to discharge and fecal material from her vagina for over 3 weeks now.    CT scan revealed diverticulitis with absess and air in the vagina likely secondary to a fistula.   We are asked to evaluate her for possible drainage/drain placement.  She takes Plavix for coronary artery disease, H/O CABG and coronary stents.   Past Medical History:  Diagnosis Date  . Arthritis    "all over"  . Atrial fibrillation ([place])   . Breast cancer ([place]) 1997   "left"  . Chronic diastolic CHF (congestive heart failure) ([place])    a. 08/2010 Echo: EF 65-70%, Gr 1 DD, Mild MR  . Chronic lower back pain   . COPD (chronic obstructive pulmonary disease) ([place])   . Coronary artery disease    a. 11/2009 CABGx3: LIMA->LAD, VG->RCA, VG->OM2. b. Cath 10/11/2014 EF 50-55%,  patent SVG to OM2, occlueded SVG to OM1, atretic and nonfunctional LIMA to LAD, 90% ostial LAD lesion treated with PTCA/LAD  . GERD (gastroesophageal reflux disease)   . Heart murmur   . History of blood transfusion 11/2009   "related to OHS"   . Hyperlipidemia   . Hypertension   . Left renal artery stenosis ([place])    a. Moderate L RAS  . Myocardial infarction ([place]) 2011  . Scoliosis of thoracic spine     Past Surgical History:  Procedure Laterality Date  . ABDOMINAL HYSTERECTOMY  1970's  . APPENDECTOMY  1970's  . BREAST BIOPSY Left 1997  .  BREAST LUMPECTOMY Left 1997  . CARDIAC CATHETERIZATION  11/2009  . CORONARY ANGIOPLASTY WITH STENT PLACEMENT  10/11/2014   "1"  . CORONARY ARTERY BYPASS GRAFT  12/21/2009   CABGX3  Dr. Prescott Gum  . LAPAROSCOPIC CHOLECYSTECTOMY  2001  . LEFT HEART CATHETERIZATION WITH CORONARY ANGIOGRAM N/A 10/11/2014   Procedure: LEFT HEART CATHETERIZATION WITH CORONARY ANGIOGRAM;  Surgeon: Burnell Blanks, MD;  Location: [facility] : [facility] CATH LAB;  Service: Cardiovascular;  Laterality: N/A;  . TONSILLECTOMY  1973  . TUBAL LIGATION  1982?    Allergies: Iohexol and Penicillins  Medications: Prior to Admission medications   Medication Sig Start Date End Date Taking? Authorizing Provider  albuterol (PROVENTIL HFA;VENTOLIN HFA) 108 (90 BASE) MCG/ACT inhaler Inhale 1 puff into the lungs every 6 (six) hours as needed for wheezing or shortness of breath.   Yes [provider]  albuterol (PROVENTIL) (2.5 MG/3ML) 0.083% nebulizer solution Take 2.5 mg by nebulization 4 (four) times daily.    Yes [provider]  ALPRAZolam (XANAX) 0.25 MG tablet Take 0.25 mg by mouth daily as needed for anxiety.  04/27/15  Yes [provider]  budesonide-formoterol (SYMBICORT) 160-4.5 MCG/ACT inhaler Inhale 2 puffs into the lungs 2 (two) times daily.   Yes [provider]  cloNIDine (CATAPRES) 0.1 MG tablet Take 1 tablet (0.1 mg total) by mouth 2 (two) times daily. 10/12/17  Yes Lauree Chandler  D, MD  clopidogrel (PLAVIX) 75 MG tablet Take 1 tablet (75 mg total) by mouth daily with breakfast. 10/21/16  Yes McAlhany, Annita Brod, MD  CRESTOR 40 MG tablet TAKE 1 TABLET BY MOUTH DAILY Patient taking differently: Take 40 mg by mouth once a day 06/20/14  Yes Burnell Blanks, MD  ferrous sulfate 325 (65 FE) MG tablet Take 1 tablet (325 mg total) by mouth 2 (two) times daily with a meal. Patient taking differently: Take 325 mg by mouth daily.  06/02/15  Yes Regalado, Belkys A, MD  FLUoxetine (PROZAC)  20 MG capsule Take 20 mg by mouth daily.   Yes [provider]  furosemide (LASIX) 40 MG tablet Take 1 tablet (40 mg total) by mouth daily. Patient taking differently: Take 40 mg by mouth daily as needed for fluid or edema.  10/31/16 10/23/17 Yes Burnell Blanks, MD  ibuprofen (ADVIL,MOTRIN) 200 MG tablet Take 200-400 mg by mouth every 6 (six) hours as needed (for pain or headaches).   Yes [provider]  ipratropium (ATROVENT) 0.02 % nebulizer solution Take 0.5 mg by nebulization 2 (two) times daily.  04/08/13  Yes [provider]  isosorbide mononitrate (IMDUR) 30 MG 24 hr tablet Take 1 tablet (30 mg total) by mouth daily. 01/30/16  Yes Burnell Blanks, MD  metoprolol tartrate (LOPRESSOR) 50 MG tablet Take 1 tablet (50 mg total) by mouth 2 (two) times daily. 07/13/17  Yes Burnell Blanks, MD  minoxidil (LONITEN) 2.5 MG tablet Take 2.5 mg by mouth daily. 07/20/17  Yes [provider]  nitroGLYCERIN (NITROSTAT) 0.4 MG SL tablet Place 1 tablet (0.4 mg total) under the tongue every 5 (five) minutes as needed for chest pain. Patient taking differently: Place 0.4 mg under the tongue every morning.  03/31/17  Yes Burnell Blanks, MD  pantoprazole (PROTONIX) 40 MG tablet TAKE 1 TABLET EVERY DAY Patient taking differently: Take 40 mg by mouth once a day 08/03/15  Yes Burnell Blanks, MD  predniSONE (DELTASONE) 20 MG tablet Take 20 mg by mouth daily. FOR 7 DAYS 10/13/17  Yes [provider]  verapamil (COVERA HS) 240 MG (CO) 24 hr tablet Take 240 mg by mouth every evening.    Yes [provider]  zolpidem (AMBIEN) 10 MG tablet Take 10 mg by mouth at bedtime as needed for sleep.  12/27/15  Yes [provider]  HYDROcodone-acetaminophen (NORCO) 10-325 MG per tablet Take 1 tablet by mouth every 8 (eight) hours as needed. Patient not taking: Reported on 10/23/2017 06/02/15   Elmarie Shiley, MD     Family History    Problem Relation Age of Onset  . Heart failure Mother   . Heart disease Mother        Before age 17  . Diabetes Mother   . Hyperlipidemia Mother   . Hypertension Mother   . COPD Father   . Heart disease Brother        Before age 41  . Hyperlipidemia Brother   . Hypertension Brother   . Cancer Sister        small  . Heart failure Grandchild   . Heart attack Brother   . Diabetes Brother     Social History   Socioeconomic History  . Marital status: Widowed    Spouse name: None  . Number of children: 6  . Years of education: None  . Highest education level: None  Social Needs  . Financial resource strain: None  .  Food insecurity - worry: None  . Food insecurity - inability: None  . Transportation needs - medical: None  . Transportation needs - non-medical: None  Occupational History  . Occupation: Retired-[employer]    Employer: RETIRED  Tobacco Use  . Smoking status: Former Smoker    Packs/day: 2.00    Years: 50.00    Pack years: 100.00    Types: Cigarettes    Last attempt to quit: 01/01/2010    Years since quitting: 7.8  . Smokeless tobacco: Never Used  Substance and Sexual Activity  . Alcohol use: No  . Drug use: No  . Sexual activity: None  Other Topics Concern  . None  Social History Narrative  . None     Review of Systems: A 12 point ROS discussed Review of Systems Constitutional: Negative for chills and fever.  HENT: Negative for hearing loss.   Eyes: Negative for blurred vision and double vision.  Respiratory: Negative for cough and hemoptysis.   Cardiovascular: Negative for chest pain and palpitations.  Gastrointestinal: Positive for abdominal pain and blood in stool. Negative for nausea and vomiting.  Genitourinary: Negative for dysuria and urgency.  Musculoskeletal: Negative for myalgias and neck pain.  Skin: Negative for itching and rash.  Neurological: Negative for dizziness, tingling and headaches.  Endo/Heme/Allergies: Does not bruise/bleed  easily.  Psychiatric/Behavioral: Negative for depression and suicidal ideas.    Vital Signs: BP (!) 121/55 (BP Location: Left Arm)   Pulse 82   Temp 97.8 F (36.6 C) (Oral)   Resp 20   Ht 5\' 2"  (1.575 m)   Wt 140 lb (63.5 kg)   SpO2 99%   BMI 25.61 kg/m   Physical Exam Vitals reviewed. Constitutional: She is oriented to person, place, and time. She appears well-developed and well-nourished.  HENT:  Head: Normocephalic and atraumatic.  Eyes: Conjunctivae and EOM are normal. Pupils are equal, round, and reactive to light.  Neck: Normal range of motion. Neck supple.  Cardiovascular: Normal rate and regular rhythm.  Respiratory: Effort normal and breath sounds normal.  GI: Soft. Bowel sounds are normal. No distension.  + tenderness in the right lower quadrant, suprapubic area and left lower quadrant.  No rigidity and no rebound.  Musculoskeletal: Normal range of motion.  Neurological: She is alert and oriented to person, place, and time.  Skin: Skin is warm and dry.  Psychiatric: She has a normal mood and affect. Her behavior is normal   Imaging: Ct Abdomen Pelvis Wo Contrast  Result Date: 10/23/2017 CLINICAL DATA:  76 year old with vaginal bleeding and black stool for 1 month. History of appendectomy, hysterectomy and cholecystectomy. EXAM: CT ABDOMEN AND PELVIS WITHOUT CONTRAST TECHNIQUE: Multidetector CT imaging of the abdomen and pelvis was performed following the standard protocol without IV contrast. COMPARISON:  Abdominopelvic CT 02/13/2012.  Lumbar MRI 09/21/2016. FINDINGS: Lower chest: 5 mm subpleural right lower lobe nodule on image number 11 (best seen on coronal image 132), not clearly seen before, but unlikely to be clinically significant. The lung bases are otherwise clear. There are mild emphysematous changes. Dense mitral annular and possible valvular calcifications. There is aortic atherosclerosis and a small left pleural effusion. Hepatobiliary: As evaluated in the  noncontrast state, the liver appears stable without focal abnormality. No significant biliary dilatation status post cholecystectomy. Pancreas: Unremarkable. No pancreatic ductal dilatation or surrounding inflammatory changes. Spleen: Normal in size without focal abnormality. Adrenals/Urinary Tract: Both adrenal glands appear normal. Both kidneys appear stable without evidence of urinary tract calculus or hydronephrosis.  There is no perinephric soft tissue stranding. The ureters and bladder appear unremarkable. There is no gas in the bladder lumen. Stomach/Bowel: No enteric contrast was administered. The stomach, small bowel and proximal colon appear normal. There are diverticular changes throughout the distal colon with sigmoid colon wall thickening and mild surrounding inflammation. Superior to the sigmoid colon, there is a tubular shaped fluid collection which measures up to 8.5 cm in length on axial image 58 and 3.0 cm transverse on image 56. There is a poor fat plane between the inflamed sigmoid colon and the vaginal cuff on the left, best seen on the coronal and sagittal images. Vascular/Lymphatic: There are no enlarged abdominal or pelvic lymph nodes. Diffuse aortic and branch vessel atherosclerosis again noted. There is a stable 2.7 cm mid abdominal aortic aneurysm, best seen on coronal image 74. There are chronically displaced intimal calcifications within the aorta. No retroperitoneal hematoma. Reproductive: Hysterectomy. Possible residual ovarian tissue bilaterally, stable. No adnexal mass. As above, there is a poor fat plane between the inflamed sigmoid colon and vaginal cuff on the left. There is some gas within the vagina. Other: There is no generalized ascites or free intraperitoneal air. No other extraluminal fluid collections are seen. Musculoskeletal: No acute or significant osseous findings. Degenerative changes in the spine associated with a thoracolumbar scoliosis. IMPRESSION: 1. Sigmoid  diverticulitis with adjacent tubular fluid collection suspicious for contained perforation and pelvic abscess. 2. Poor fat plane between the inflamed sigmoid colon and the vaginal cuff on the left. There is gas in the vagina. 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 Surgical consultation recommended. Follow-up CT with oral or rectal contrast, and intravenous contrast if possible, may be helpful to better evaluate the pelvic findings. 3. Small left pleural effusion. 4.  Aortic Atherosclerosis (ICD10-I70.0). Electronically Signed   By: Richardean Sale M.D.   On: 10/23/2017 17:13   Dg Chest 2 View  Result Date: 10/23/2017 CLINICAL DATA:  Increasing shortness of breath EXAM: CHEST  2 VIEW COMPARISON:  10/28/2016,  06/01/2005 FINDINGS: Post sternotomy changes. Postsurgical changes in the left axillary region. Hyperinflation. Diffuse left greater than right interstitial opacity, slightly more prominent compared to previous. Vague focal opacity in the left upper lung. Cardiomegaly with aortic atherosclerosis. No pneumothorax. Multiple compression deformities of the spine IMPRESSION: 1. Hyperinflation. Mild diffuse left greater than right interstitial opacities somewhat more prominent compared to prior, findings could represent mild acute edema or inflammatory process on chronic change. A vague focal opacities seen in the left upper lung and may represent a small focus of pulmonary infiltrate. Short interval radiographic follow-up recommended 2. Cardiomegaly Electronically Signed   By: Donavan Foil M.D.   On: 10/23/2017 16:08    Labs:  CBC: Recent Labs    10/23/17 1133 10/24/17 0703  WBC 11.9* 8.8  HGB 8.6* 7.7*  HCT 31.8* 29.2*  PLT 295 277    COAGS: Recent Labs    10/23/17 1508 10/24/17 0703  INR 1.04 1.09    BMP: Recent Labs    10/31/16 1552 11/06/16 1549 10/23/17 1133 10/24/17 0703  NA 145* 140 141 137  K 4.1 4.4 4.1 3.9  CL 103  101 106 106  CO2 23 24 25 23   GLUCOSE 108* 91 110* 89  BUN 22 30* 21* 20  CALCIUM 9.2 9.1 8.7* 8.5*  CREATININE 1.08* 1.22* 1.01* 1.02*  GFRNONAA 51* 44* 53* 52*  GFRAA 58* 50* >60 >60    LIVER FUNCTION TESTS: Recent  Labs    10/23/17 1133  BILITOT 0.6  AST 19  ALT 16  ALKPHOS 47  PROT 6.5  ALBUMIN 3.7    TUMOR MARKERS: No results for input(s): AFPTM, CEA, CA199, CHROMGRNA in the last 8760 hours.  Assessment and Plan:  Sigmoid diverticulitis with adjacent tubular fluid collection suspicious for contained perforation and pelvic abscess.  Since patient has improved some clinically with antibitoics, will proceed with CT guided drainage/drain placement on Tuesday due to Plavix needing to be held x 5 days per our protocol.  Risks and benefits discussed with the patient including bleeding, infection, damage to adjacent structures, bowel perforation/fistula connection, and sepsis.  All of the patient's questions were answered, patient is agreeable to proceed. Consent signed and in chart.  Thank you for this interesting consult.  I greatly enjoyed meeting Joanne [name] and look forward to participating in their care.  A copy of this report was sent to the requesting provider on this date.  Electronically Signed: Murrell Redden, PA-C 10/24/2017, 12:31 PM   I spent a total of 40 Minutes in face to face in clinical consultation, greater than 50% of which was counseling/coordinating care for drain placement.

## 2017-10-24 NOTE — Progress Notes (Signed)
Pt denies any signs of allergic reaction to current antibiotic.

## 2017-10-24 NOTE — Progress Notes (Addendum)
Received pt alert and oriented, ambulated to bed. Denies pain at this time. Pt's sons at bedside, with too many questions regarding pt care.  Oriented pt to her room and use of call light. Verified with MD on call regarding pt's diet. Pt will be kept NPO, sips with meds and can have ice chips per MD. Educated pt.

## 2017-10-24 NOTE — Progress Notes (Signed)
PROGRESS NOTE    Joanne [name]  TZG:[id number] DOB: 23-Nov-1940 DOA: 10/23/2017 PCP: Leanna Battles, MD   Brief Narrative:  Joanne [name] is a 76 y.o. female with medical history significant of CHF, CAD/CABG, Afib, copd, not on home supplemental oxygen, GERD, previous diverticulitis about 3 months resolved clinically with oral abx, comes in with over a month of lower abdominal pain which has progressed to discharge and fecal material from her vagina for over 3 weeks now.  Pt has seen her PCP during this time but did not discuss her acute issues with him because she was concerned that she has already had too much surgery.  Denies any fevers, but having chills.  No n/v/d.  Pt found to have diverticulitis with abscess and air in the vagina likely a fistula.  General surgery has been called and will evaluate the pt and recommending Medical management with IV Abx and IR Consulted and will likely place Drain on Tuesday.   Assessment & Plan:   Principal Problem:   Diverticulitis of large intestine with complication Active Problems:   Essential hypertension   COPD (chronic obstructive pulmonary disease) (HCC)   Renal artery stenosis (HCC)   Hyperlipidemia   Chronic diastolic CHF (congestive heart failure) (HCC)   Atherosclerosis of renal artery (HCC)   Chronic anemia  Acute Sigmoid Diverticulitis of Large Intestine with Complication including Abscess and Colovaginal Fistula -Admitted to Med Surge -CT Abd/Pelvis showed Sigmoid diverticulitis with adjacent tubular fluid collection suspicious for contained perforation and pelvic abscess. Poor fat plane between the inflamed sigmoid colon and the vaginal cuff on the left. There is gas in the vagina. 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 Small left pleural effusion. Aortic Atherosclerosis  -General Surgery Consulted and appreciate recommendations; Recommending Medical management for now and Sips of  Clears -IR Consulted and evaluated and ar planning on CT Guided Drainage/Drain Placement 10/27/17 as patient has to be off of Plavix for 5 days.  -Holding aspirin products and anticoagulants for now until any surgical plan clearer. -C/w IV Zosyn -Pain Control with po Tramadol as patient's Son adamantly does not want Opioids as he states his mother is an "opioid addict"  Essential Hypertension  -C/w Clonidine 0.1 mg po BID, Minoxidil 2.5 mg Daily Verapamil 240 mg po qHS, Metoprolol 50 mg po BID -Given Hydralazine 20 mg IV once yesterday -Continue to Monitor   COPD (chronic obstructive pulmonary disease)  -Currently not in Exacerbation -C/w Albuterol 2.5 mg Neb 4 times daily and q6hprn, Atrovent 0.5 mg Neb BID,    Hyperlipidemia -C/w Rosuvastatin 40 mg po Daily     Chronic diastolic CHF (congestive heart failure) ([place]) -Was Euvolemic on Examination -BNP was 622.9 on Admission -Holding of IVF currently -Strict I's/O's, Daily Weights, SLIV  -Currently NPO and Surgery Allowing Sips of Clear Liquids -Patient sees Dr. Angelena Form in [facility] and General Surgery requesting involvement for recommendations for Plavix;     Atherosclerosis of renal artery and Renal Artery Stenosis -Continue to Monitor BUN/Cr; Was 20/1.02 today    Acute on Chronic Anemia -Hb/Hct went from 8.6/31.8 -> 7.7/29.2 -Anemia Panel showed Iron Level of 17, UIBC of 417, TIBC of 434, Saturation Ratios of 4, Ferritin of 13, Folate of 16.4, and Vitamin B12 fo 101 -She notes possible some blood from the vagina.   -Continue to Monitor for S/Sx of Bleeding -Check FOBT -Repeat CBC in AM   Hx of CAD s/p CABG -Held Plavix for CT Guided Drain  Placement (will be done 10/27/17 -C/w Isosorbide Mononitrate 30 mg po Daily and Rosuvastatin 40 mg po Daily  -Holding ASA and Plavix  -Continue to Monitor   DVT prophylaxis: SCD's given Acute drop in Hb/Hct  Code Status: FULL CODE Family Communication: Discussed with Son at  bedside Disposition Plan: Remain Inpatient for continued Treatement  Consultants:   General Surgery  Interventional Radiology   Procedures: None  Antimicrobials:  Anti-infectives (From admission, onward)   Start     Dose/Rate Route Frequency Ordered Stop   10/24/17 0100  piperacillin-tazobactam (ZOSYN) IVPB 3.375 g     3.375 g 12.5 mL/hr over 240 Minutes Intravenous Every 8 hours 10/23/17 2041     10/23/17 1745  piperacillin-tazobactam (ZOSYN) IVPB 3.375 g     3.375 g 100 mL/hr over 30 Minutes Intravenous  Once 10/23/17 1739 10/23/17 1927     Subjective: Seen and examined at bedside and stated she was doing ok. No CP or SOB. Abdominal discomfort but no actual pain.   Objective: Vitals:   10/23/17 2027 10/23/17 2105 10/23/17 2108 10/24/17 0500  BP: (!) 157/65   (!) 121/55  Pulse: 85   82  Resp: 20   20  Temp: 98.1 F (36.7 C)   97.8 F (36.6 C)  TempSrc: Oral   Oral  SpO2: 100% 98% 99% 98%  Weight:      Height:        Intake/Output Summary (Last 24 hours) at 10/24/2017 1610 Last data filed at 10/24/2017 9604 Gross per 24 hour  Intake 50 ml  Output 700 ml  Net -650 ml   Filed Weights   10/23/17 1117  Weight: 63.5 kg (140 lb)   Examination: Physical Exam:  Constitutional: WN/WD Caucasian female in NAD and appears calm and comfortable Eyes: Lids and conjunctivae normal, sclerae anicteric  ENMT: External Ears, Nose appear normal. Grossly normal hearing. Mucous membranes are moist.  Neck: Appears normal, supple, no cervical masses, normal ROM, no appreciable thyromegaly, no JVD Respiratory: Diminished to auscultation bilaterally, no wheezing, rales, rhonchi or crackles. Normal respiratory effort and patient is not tachypenic. No accessory muscle use.  Cardiovascular: RRR, no murmurs / rubs / gallops. S1 and S2 auscultated.  Abdomen: Soft, mildly tender lower abdomen, non-distended. No masses palpated. No appreciable hepatosplenomegaly. Bowel sounds positive x4.   GU: Deferred. Musculoskeletal: No clubbing / cyanosis of digits/nails. No joint deformity upper and lower extremities.  Skin: No rashes, lesions, ulcers on a limited skin eval . No induration; Warm and dry.  Neurologic: CN 2-12 grossly intact with no focal deficits. Romberg sign and cerebellar reflexes not assessed.  Psychiatric: Normal judgment and insight. Alert and oriented x 3. Normal mood and appropriate affect.   Data Reviewed: I have personally reviewed following labs and imaging studies  CBC: Recent Labs  Lab 10/23/17 1133 10/24/17 0703  WBC 11.9* 8.8  HGB 8.6* 7.7*  HCT 31.8* 29.2*  MCV 75.7* 73.9*  PLT 295 540   Basic Metabolic Panel: Recent Labs  Lab 10/23/17 1133 10/24/17 0703  NA 141 137  K 4.1 3.9  CL 106 106  CO2 25 23  GLUCOSE 110* 89  BUN 21* 20  CREATININE 1.01* 1.02*  CALCIUM 8.7* 8.5*   GFR: Estimated Creatinine Clearance: 41.8 mL/min (A) (by C-G formula based on SCr of 1.02 mg/dL (H)). Liver Function Tests: Recent Labs  Lab 10/23/17 1133  AST 19  ALT 16  ALKPHOS 47  BILITOT 0.6  PROT 6.5  ALBUMIN 3.7  Recent Labs  Lab 10/23/17 1508  LIPASE 23   No results for input(s): AMMONIA in the last 168 hours. Coagulation Profile: Recent Labs  Lab 10/23/17 1508 10/24/17 0703  INR 1.04 1.09   Cardiac Enzymes: No results for input(s): CKTOTAL, CKMB, CKMBINDEX, TROPONINI in the last 168 hours. BNP (last 3 results) No results for input(s): PROBNP in the last 8760 hours. HbA1C: No results for input(s): HGBA1C in the last 72 hours. CBG: No results for input(s): GLUCAP in the last 168 hours. Lipid Profile: No results for input(s): CHOL, HDL, LDLCALC, TRIG, CHOLHDL, LDLDIRECT in the last 72 hours. Thyroid Function Tests: No results for input(s): TSH, T4TOTAL, FREET4, T3FREE, THYROIDAB in the last 72 hours. Anemia Panel: Recent Labs    10/23/17 1838 10/23/17 1850  VITAMINB12  --  101*  FOLATE 16.4  --   FERRITIN  --  13  TIBC  --  434   IRON  --  17*  RETICCTPCT  --  2.2   Sepsis Labs: Recent Labs  Lab 10/23/17 1547 10/23/17 1909  LATICACIDVEN 1.42 1.82    No results found for this or any previous visit (from the past 240 hour(s)).   Radiology Studies: Ct Abdomen Pelvis Wo Contrast  Result Date: 10/23/2017 CLINICAL DATA:  76 year old with vaginal bleeding and black stool for 1 month. History of appendectomy, hysterectomy and cholecystectomy. EXAM: CT ABDOMEN AND PELVIS WITHOUT CONTRAST TECHNIQUE: Multidetector CT imaging of the abdomen and pelvis was performed following the standard protocol without IV contrast. COMPARISON:  Abdominopelvic CT 02/13/2012.  Lumbar MRI 09/21/2016. FINDINGS: Lower chest: 5 mm subpleural right lower lobe nodule on image number 11 (best seen on coronal image 132), not clearly seen before, but unlikely to be clinically significant. The lung bases are otherwise clear. There are mild emphysematous changes. Dense mitral annular and possible valvular calcifications. There is aortic atherosclerosis and a small left pleural effusion. Hepatobiliary: As evaluated in the noncontrast state, the liver appears stable without focal abnormality. No significant biliary dilatation status post cholecystectomy. Pancreas: Unremarkable. No pancreatic ductal dilatation or surrounding inflammatory changes. Spleen: Normal in size without focal abnormality. Adrenals/Urinary Tract: Both adrenal glands appear normal. Both kidneys appear stable without evidence of urinary tract calculus or hydronephrosis. There is no perinephric soft tissue stranding. The ureters and bladder appear unremarkable. There is no gas in the bladder lumen. Stomach/Bowel: No enteric contrast was administered. The stomach, small bowel and proximal colon appear normal. There are diverticular changes throughout the distal colon with sigmoid colon wall thickening and mild surrounding inflammation. Superior to the sigmoid colon, there is a tubular shaped  fluid collection which measures up to 8.5 cm in length on axial image 58 and 3.0 cm transverse on image 56. There is a poor fat plane between the inflamed sigmoid colon and the vaginal cuff on the left, best seen on the coronal and sagittal images. Vascular/Lymphatic: There are no enlarged abdominal or pelvic lymph nodes. Diffuse aortic and branch vessel atherosclerosis again noted. There is a stable 2.7 cm mid abdominal aortic aneurysm, best seen on coronal image 74. There are chronically displaced intimal calcifications within the aorta. No retroperitoneal hematoma. Reproductive: Hysterectomy. Possible residual ovarian tissue bilaterally, stable. No adnexal mass. As above, there is a poor fat plane between the inflamed sigmoid colon and vaginal cuff on the left. There is some gas within the vagina. Other: There is no generalized ascites or free intraperitoneal air. No other extraluminal fluid collections are seen. Musculoskeletal: No acute or significant  osseous findings. Degenerative changes in the spine associated with a thoracolumbar scoliosis. IMPRESSION: 1. Sigmoid diverticulitis with adjacent tubular fluid collection suspicious for contained perforation and pelvic abscess. 2. Poor fat plane between the inflamed sigmoid colon and the vaginal cuff on the left. There is gas in the vagina. 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 Surgical consultation recommended. Follow-up CT with oral or rectal contrast, and intravenous contrast if possible, may be helpful to better evaluate the pelvic findings. 3. Small left pleural effusion. 4.  Aortic Atherosclerosis (ICD10-I70.0). Electronically Signed   By: Richardean Sale M.D.   On: 10/23/2017 17:13   Dg Chest 2 View  Result Date: 10/23/2017 CLINICAL DATA:  Increasing shortness of breath EXAM: CHEST  2 VIEW COMPARISON:  10/28/2016,  06/01/2005 FINDINGS: Post sternotomy changes. Postsurgical changes in the left  axillary region. Hyperinflation. Diffuse left greater than right interstitial opacity, slightly more prominent compared to previous. Vague focal opacity in the left upper lung. Cardiomegaly with aortic atherosclerosis. No pneumothorax. Multiple compression deformities of the spine IMPRESSION: 1. Hyperinflation. Mild diffuse left greater than right interstitial opacities somewhat more prominent compared to prior, findings could represent mild acute edema or inflammatory process on chronic change. A vague focal opacities seen in the left upper lung and may represent a small focus of pulmonary infiltrate. Short interval radiographic follow-up recommended 2. Cardiomegaly Electronically Signed   By: Donavan Foil M.D.   On: 10/23/2017 16:08   Scheduled Meds: . albuterol  2.5 mg Nebulization QID  . cloNIDine  0.1 mg Oral BID  . docusate sodium  100 mg Oral BID  . ferrous sulfate  325 mg Oral BID WC  . FLUoxetine  20 mg Oral Daily  . [START ON 10/25/2017] Influenza vac split quadrivalent PF  0.5 mL Intramuscular Tomorrow-1000  . ipratropium  0.5 mg Nebulization BID  . isosorbide mononitrate  30 mg Oral Daily  . mouth rinse  15 mL Mouth Rinse BID  . metoprolol tartrate  50 mg Oral BID  . minoxidil  2.5 mg Oral Daily  . mometasone-formoterol  2 puff Inhalation BID  . pantoprazole  40 mg Oral Daily  . [START ON 10/25/2017] pneumococcal 23 valent vaccine  0.5 mL Intramuscular Tomorrow-1000  . rosuvastatin  40 mg Oral Daily  . sodium chloride flush  3 mL Intravenous Q12H  . verapamil  240 mg Oral QPM   Continuous Infusions: . sodium chloride    . piperacillin-tazobactam (ZOSYN)  IV Stopped (10/24/17 0517)    LOS: 1 day   Kerney Elbe, DO [facility] Pager [phone]  If 7PM-7AM, please contact night-coverage [email or web address] Password [email or web address] 10/24/2017, 9:04 AM

## 2017-10-24 NOTE — Progress Notes (Addendum)
Patients son confronted nurse outside of room about patients opioid addiction.  Requested nurse to look for note in chart about opioid addiction since he requested night nurse to make a note.  No note found.  Son very upset to find out narcotics had been given over night.  Son insisted that MD be paged to discuss with change in orders for non narcotics.  MD [facility] paged.  Will continue to monitor.

## 2017-10-24 NOTE — Consult Note (Signed)
Reason for Consult: vaginal drainage Referring Physician: Estevan Ryder is an 76 y.o. female.  HPI: 76 yo female with 3 week history of intermittent vaginal bleeding and abdominal pain. The pain has been waxing and waning over the period. She also had some black stools. She has had fevers. She denies nausea and vomiting. She began having fecal drainage out her vagina and her family wanted her to come to the hospital.  Past Medical History:  Diagnosis Date  . Arthritis    "all over"  . Atrial fibrillation ([place])   . Breast cancer ([place]) 1997   "left"  . Chronic diastolic CHF (congestive heart failure) ([place])    a. 08/2010 Echo: EF 65-70%, Gr 1 DD, Mild MR  . Chronic lower back pain   . COPD (chronic obstructive pulmonary disease) ([place])   . Coronary artery disease    a. 11/2009 CABGx3: LIMA->LAD, VG->RCA, VG->OM2. b. Cath 10/11/2014 EF 50-55%,  patent SVG to OM2, occlueded SVG to OM1, atretic and nonfunctional LIMA to LAD, 90% ostial LAD lesion treated with PTCA/LAD  . GERD (gastroesophageal reflux disease)   . Heart murmur   . History of blood transfusion 11/2009   "related to OHS"   . Hyperlipidemia   . Hypertension   . Left renal artery stenosis ([place])    a. Moderate L RAS  . Myocardial infarction ([place]) 2011  . Scoliosis of thoracic spine     Past Surgical History:  Procedure Laterality Date  . ABDOMINAL HYSTERECTOMY  1970's  . APPENDECTOMY  1970's  . BREAST BIOPSY Left 1997  . BREAST LUMPECTOMY Left 1997  . CARDIAC CATHETERIZATION  11/2009  . CORONARY ANGIOPLASTY WITH STENT PLACEMENT  10/11/2014   "1"  . CORONARY ARTERY BYPASS GRAFT  12/21/2009   CABGX3  Dr. Prescott Gum  . LAPAROSCOPIC CHOLECYSTECTOMY  2001  . LEFT HEART CATHETERIZATION WITH CORONARY ANGIOGRAM N/A 10/11/2014   Procedure: LEFT HEART CATHETERIZATION WITH CORONARY ANGIOGRAM;  Surgeon: Burnell Blanks, MD;  Location: [facility] CATH LAB;  Service: Cardiovascular;  Laterality: N/A;  . TONSILLECTOMY  1973  .  TUBAL LIGATION  1982?    Family History  Problem Relation Age of Onset  . Heart failure Mother   . Heart disease Mother        Before age 5  . Diabetes Mother   . Hyperlipidemia Mother   . Hypertension Mother   . COPD Father   . Heart disease Brother        Before age 40  . Hyperlipidemia Brother   . Hypertension Brother   . Cancer Sister        small  . Heart failure Grandchild   . Heart attack Brother   . Diabetes Brother     Social History:  reports that she quit smoking about 7 years ago. Her smoking use included cigarettes. She has a 100.00 pack-year smoking history. she has never used smokeless tobacco. She reports that she does not drink alcohol or use drugs.  Allergies:  Allergies  Allergen Reactions  . Iohexol Anaphylaxis, Shortness Of Breath and Swelling     Code: SOB, Desc: ct @ ser w/ throat swelling & sob, ok w/ 13 hr prep//a.c., Onset Date: [id number]   . Penicillins Anaphylaxis    Has patient had a PCN reaction causing immediate rash, facial/tongue/throat swelling, SOB or lightheadedness with hypotension: Yes Has patient had a PCN reaction causing severe rash involving mucus membranes or skin necrosis: No Has patient had  a PCN reaction that required hospitalization: No Has patient had a PCN reaction occurring within the last 10 years: No If all of the above answers are "NO", then may proceed with Cephalosporin use.     Medications: I have reviewed the patient's current medications.  Results for orders placed or performed during the hospital encounter of 10/23/17 (from the past 48 hour(s))  Comprehensive metabolic panel     Status: Abnormal   Collection Time: 10/23/17 11:33 AM  Result Value Ref Range   Sodium 141 135 - 145 mmol/L   Potassium 4.1 3.5 - 5.1 mmol/L   Chloride 106 101 - 111 mmol/L   CO2 25 22 - 32 mmol/L   Glucose, Bld 110 (H) 65 - 99 mg/dL   BUN 21 (H) 6 - 20 mg/dL   Creatinine, Ser 1.01 (H) 0.44 - 1.00 mg/dL   Calcium 8.7 (L) 8.9 - 10.3  mg/dL   Total Protein 6.5 6.5 - 8.1 g/dL   Albumin 3.7 3.5 - 5.0 g/dL   AST 19 15 - 41 U/L   ALT 16 14 - 54 U/L   Alkaline Phosphatase 47 38 - 126 U/L   Total Bilirubin 0.6 0.3 - 1.2 mg/dL   GFR calc non Af Amer 53 (L) >60 mL/min   GFR calc Af Amer >60 >60 mL/min    Comment: (NOTE) The eGFR has been calculated using the CKD EPI equation. This calculation has not been validated in all clinical situations. eGFR's persistently <60 mL/min signify possible Chronic Kidney Disease.    Anion gap 10 5 - 15  CBC     Status: Abnormal   Collection Time: 10/23/17 11:33 AM  Result Value Ref Range   WBC 11.9 (H) 4.0 - 10.5 K/uL   RBC 4.20 3.87 - 5.11 MIL/uL   Hemoglobin 8.6 (L) 12.0 - 15.0 g/dL   HCT 31.8 (L) 36.0 - 46.0 %   MCV 75.7 (L) 78.0 - 100.0 fL   MCH 20.5 (L) 26.0 - 34.0 pg   MCHC 27.0 (L) 30.0 - 36.0 g/dL   RDW 17.0 (H) 11.5 - 15.5 %   Platelets 295 150 - 400 K/uL  Type and screen Industry     Status: None   Collection Time: 10/23/17 11:33 AM  Result Value Ref Range   ABO/RH(D) A POS    Antibody Screen NEG    Sample Expiration 10/26/2017   Lipase, blood     Status: None   Collection Time: 10/23/17  3:08 PM  Result Value Ref Range   Lipase 23 11 - 51 U/L  Protime-INR     Status: None   Collection Time: 10/23/17  3:08 PM  Result Value Ref Range   Prothrombin Time 13.5 11.4 - 15.2 seconds   INR 1.04   Brain natriuretic peptide     Status: Abnormal   Collection Time: 10/23/17  3:12 PM  Result Value Ref Range   B Natriuretic Peptide 622.9 (H) 0.0 - 100.0 pg/mL  Urinalysis, Routine w reflex microscopic     Status: Abnormal   Collection Time: 10/23/17  3:17 PM  Result Value Ref Range   Color, Urine YELLOW YELLOW   APPearance HAZY (A) CLEAR   Specific Gravity, Urine 1.021 1.005 - 1.030   pH 6.0 5.0 - 8.0   Glucose, UA NEGATIVE NEGATIVE mg/dL   Hgb urine dipstick NEGATIVE NEGATIVE   Bilirubin Urine NEGATIVE NEGATIVE   Ketones, ur NEGATIVE NEGATIVE mg/dL    Protein, ur 30 (A)  NEGATIVE mg/dL   Nitrite NEGATIVE NEGATIVE   Leukocytes, UA LARGE (A) NEGATIVE   RBC / HPF 6-30 0 - 5 RBC/hpf   WBC, UA TOO NUMEROUS TO COUNT 0 - 5 WBC/hpf   Bacteria, UA FEW (A) NONE SEEN   Squamous Epithelial / LPF 0-5 (A) NONE SEEN   Mucus PRESENT    Hyaline Casts, UA PRESENT   I-Stat Troponin, ED (not at [facility])     Status: None   Collection Time: 10/23/17  3:45 PM  Result Value Ref Range   Troponin i, poc 0.01 0.00 - 0.08 ng/mL   Comment 3            Comment: Due to the release kinetics of cTnI, a negative result within the first hours of the onset of symptoms does not rule out myocardial infarction with certainty. If myocardial infarction is still suspected, repeat the test at appropriate intervals.   I-Stat CG4 Lactic Acid, ED     Status: None   Collection Time: 10/23/17  3:47 PM  Result Value Ref Range   Lactic Acid, Venous 1.42 0.5 - 1.9 mmol/L  Folate     Status: None   Collection Time: 10/23/17  6:38 PM  Result Value Ref Range   Folate 16.4 >5.9 ng/mL  Vitamin B12     Status: Abnormal   Collection Time: 10/23/17  6:50 PM  Result Value Ref Range   Vitamin B-12 101 (L) 180 - 914 pg/mL    Comment: (NOTE) This assay is not validated for testing neonatal or myeloproliferative syndrome specimens for Vitamin B12 levels.   Iron and TIBC     Status: Abnormal   Collection Time: 10/23/17  6:50 PM  Result Value Ref Range   Iron 17 (L) 28 - 170 ug/dL   TIBC 434 250 - 450 ug/dL   Saturation Ratios 4 (L) 10.4 - 31.8 %   UIBC 417 ug/dL  Ferritin     Status: None   Collection Time: 10/23/17  6:50 PM  Result Value Ref Range   Ferritin 13 11 - 307 ng/mL  Reticulocytes     Status: None   Collection Time: 10/23/17  6:50 PM  Result Value Ref Range   Retic Ct Pct 2.2 0.4 - 3.1 %   RBC. 4.56 3.87 - 5.11 MIL/uL   Retic Count, Absolute 100.3 19.0 - 186.0 K/uL  I-Stat CG4 Lactic Acid, ED     Status: None   Collection Time: 10/23/17  7:09 PM  Result Value Ref  Range   Lactic Acid, Venous 1.82 0.5 - 1.9 mmol/L    Ct Abdomen Pelvis Wo Contrast  Result Date: 10/23/2017 CLINICAL DATA:  76 year old with vaginal bleeding and black stool for 1 month. History of appendectomy, hysterectomy and cholecystectomy. EXAM: CT ABDOMEN AND PELVIS WITHOUT CONTRAST TECHNIQUE: Multidetector CT imaging of the abdomen and pelvis was performed following the standard protocol without IV contrast. COMPARISON:  Abdominopelvic CT 02/13/2012.  Lumbar MRI 09/21/2016. FINDINGS: Lower chest: 5 mm subpleural right lower lobe nodule on image number 11 (best seen on coronal image 132), not clearly seen before, but unlikely to be clinically significant. The lung bases are otherwise clear. There are mild emphysematous changes. Dense mitral annular and possible valvular calcifications. There is aortic atherosclerosis and a small left pleural effusion. Hepatobiliary: As evaluated in the noncontrast state, the liver appears stable without focal abnormality. No significant biliary dilatation status post cholecystectomy. Pancreas: Unremarkable. No pancreatic ductal dilatation or surrounding inflammatory changes. Spleen: Normal in  size without focal abnormality. Adrenals/Urinary Tract: Both adrenal glands appear normal. Both kidneys appear stable without evidence of urinary tract calculus or hydronephrosis. There is no perinephric soft tissue stranding. The ureters and bladder appear unremarkable. There is no gas in the bladder lumen. Stomach/Bowel: No enteric contrast was administered. The stomach, small bowel and proximal colon appear normal. There are diverticular changes throughout the distal colon with sigmoid colon wall thickening and mild surrounding inflammation. Superior to the sigmoid colon, there is a tubular shaped fluid collection which measures up to 8.5 cm in length on axial image 58 and 3.0 cm transverse on image 56. There is a poor fat plane between the inflamed sigmoid colon and the  vaginal cuff on the left, best seen on the coronal and sagittal images. Vascular/Lymphatic: There are no enlarged abdominal or pelvic lymph nodes. Diffuse aortic and branch vessel atherosclerosis again noted. There is a stable 2.7 cm mid abdominal aortic aneurysm, best seen on coronal image 74. There are chronically displaced intimal calcifications within the aorta. No retroperitoneal hematoma. Reproductive: Hysterectomy. Possible residual ovarian tissue bilaterally, stable. No adnexal mass. As above, there is a poor fat plane between the inflamed sigmoid colon and vaginal cuff on the left. There is some gas within the vagina. Other: There is no generalized ascites or free intraperitoneal air. No other extraluminal fluid collections are seen. Musculoskeletal: No acute or significant osseous findings. Degenerative changes in the spine associated with a thoracolumbar scoliosis. IMPRESSION: 1. Sigmoid diverticulitis with adjacent tubular fluid collection suspicious for contained perforation and pelvic abscess. 2. Poor fat plane between the inflamed sigmoid colon and the vaginal cuff on the left. There is gas in the vagina. 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 Surgical consultation recommended. Follow-up CT with oral or rectal contrast, and intravenous contrast if possible, may be helpful to better evaluate the pelvic findings. 3. Small left pleural effusion. 4.  Aortic Atherosclerosis (ICD10-I70.0). Electronically Signed   By: Richardean Sale M.D.   On: 10/23/2017 17:13   Dg Chest 2 View  Result Date: 10/23/2017 CLINICAL DATA:  Increasing shortness of breath EXAM: CHEST  2 VIEW COMPARISON:  10/28/2016,  06/01/2005 FINDINGS: Post sternotomy changes. Postsurgical changes in the left axillary region. Hyperinflation. Diffuse left greater than right interstitial opacity, slightly more prominent compared to previous. Vague focal opacity in the left upper lung.  Cardiomegaly with aortic atherosclerosis. No pneumothorax. Multiple compression deformities of the spine IMPRESSION: 1. Hyperinflation. Mild diffuse left greater than right interstitial opacities somewhat more prominent compared to prior, findings could represent mild acute edema or inflammatory process on chronic change. A vague focal opacities seen in the left upper lung and may represent a small focus of pulmonary infiltrate. Short interval radiographic follow-up recommended 2. Cardiomegaly Electronically Signed   By: Donavan Foil M.D.   On: 10/23/2017 16:08    Review of Systems  Constitutional: Negative for chills and fever.  HENT: Negative for hearing loss.   Eyes: Negative for blurred vision and double vision.  Respiratory: Negative for cough and hemoptysis.   Cardiovascular: Negative for chest pain and palpitations.  Gastrointestinal: Positive for abdominal pain and blood in stool. Negative for nausea and vomiting.  Genitourinary: Negative for dysuria and urgency.  Musculoskeletal: Negative for myalgias and neck pain.  Skin: Negative for itching and rash.  Neurological: Negative for dizziness, tingling and headaches.  Endo/Heme/Allergies: Does not bruise/bleed easily.  Psychiatric/Behavioral: Negative for depression and suicidal ideas.   Blood pressure Marland Kitchen)  121/55, pulse 82, temperature 97.8 F (36.6 C), temperature source Oral, resp. rate 20, height '5\' 2"'  (1.575 m), weight 63.5 kg (140 lb), SpO2 98 %. Physical Exam  Vitals reviewed. Constitutional: She is oriented to person, place, and time. She appears well-developed and well-nourished.  HENT:  Head: Normocephalic and atraumatic.  Eyes: Conjunctivae and EOM are normal. Pupils are equal, round, and reactive to light.  Neck: Normal range of motion. Neck supple.  Cardiovascular: Normal rate and regular rhythm.  Respiratory: Effort normal and breath sounds normal.  GI: Soft. Bowel sounds are normal. She exhibits no distension. There  is tenderness in the right lower quadrant, suprapubic area and left lower quadrant. There is no rigidity and no rebound.  Musculoskeletal: Normal range of motion.  Neurological: She is alert and oriented to person, place, and time.  Skin: Skin is warm and dry.  Psychiatric: She has a normal mood and affect. Her behavior is normal.    Assessment/Plan: 76 yo female with diverticulitis with evidence of colovaginal fistula. She has a moderate sized fluid collection as well. -IV abx -bowel rest -consult IR for possible drain placement to abscess -long term will likely need sigmoidectomy but will try antibiotics to allow things to calm down  Arta Bruce Kinsinger 10/24/2017, 6:34 AM

## 2017-10-24 NOTE — Progress Notes (Signed)
CC: Abdominal pain  Subjective: Feels better this AM, she is very concerned with the plan and what IR will do. I explained diverticulitis to her, her son and his wife.  I discussed medical management versus surgical management.  It was the discharge from her vagina and that ultimately made her come to the ED for evaluation.  Objective: Vital signs in last 24 hours: Temp:  [97.8 F (36.6 C)-98.7 F (37.1 C)] 97.8 F (36.6 C) (12/29 0500) Pulse Rate:  [70-85] 82 (12/29 0500) Resp:  [17-20] 20 (12/29 0500) BP: (121-184)/(55-93) 121/55 (12/29 0500) SpO2:  [95 %-100 %] 99 % (12/29 0911) FiO2 (%):  [3 %] 3 % (12/29 0500) Weight:  [63.5 kg (140 lb)] 63.5 kg (140 lb) (12/28 1117)  N.p.o.  50 mL's IV recorded 700 urine recorded Afebrile vital signs are stable BMP stable WBC improved down to 8.8. Urine nitrate negative TNTC WBC culture pending  CT scan 12/28: Diverticular changes throughout the distal colon, sigmoid colon wall thickening and mild surrounding inflammation.  Superior to the sigmoid there is a tubular shaped collection which measures 8.5 cm.  Impression sigmoid diverticulitis with fluid collection Poor fat plane between the inflamed sigmoid colon and vaginal the cuff on the left.  There is gas in the vagina.  Suggestion of a possible colovaginal fistula. Intake/Output from previous day: 12/28 0701 - 12/29 0700 In: 50 [IV Piggyback:50] Out: 700 [Urine:700] Intake/Output this shift: No intake/output data recorded.  General appearance: alert, cooperative and no distress Resp: clear to auscultation bilaterally GI: Patient is still sore but no point tenderness.  Discomfort is in the lower abdomen below the umbilicus both on the right and the left.  No distention nausea or vomiting.  Lab Results:  Recent Labs    10/23/17 1133 10/24/17 0703  WBC 11.9* 8.8  HGB 8.6* 7.7*  HCT 31.8* 29.2*  PLT 295 277    BMET Recent Labs    10/23/17 1133 10/24/17 0703  NA 141  137  K 4.1 3.9  CL 106 106  CO2 25 23  GLUCOSE 110* 89  BUN 21* 20  CREATININE 1.01* 1.02*  CALCIUM 8.7* 8.5*   PT/INR Recent Labs    10/23/17 1508 10/24/17 0703  LABPROT 13.5 14.0  INR 1.04 1.09    Recent Labs  Lab 10/23/17 1133  AST 19  ALT 16  ALKPHOS 47  BILITOT 0.6  PROT 6.5  ALBUMIN 3.7     Lipase     Component Value Date/Time   LIPASE 23 10/23/2017 1508     Prior to Admission medications   Medication Sig Start Date End Date Taking? Authorizing Provider  albuterol (PROVENTIL HFA;VENTOLIN HFA) 108 (90 BASE) MCG/ACT inhaler Inhale 1 puff into the lungs every 6 (six) hours as needed for wheezing or shortness of breath.   Yes [provider]  albuterol (PROVENTIL) (2.5 MG/3ML) 0.083% nebulizer solution Take 2.5 mg by nebulization 4 (four) times daily.    Yes [provider]  ALPRAZolam (XANAX) 0.25 MG tablet Take 0.25 mg by mouth daily as needed for anxiety.  04/27/15  Yes [provider]  budesonide-formoterol (SYMBICORT) 160-4.5 MCG/ACT inhaler Inhale 2 puffs into the lungs 2 (two) times daily.   Yes [provider]  cloNIDine (CATAPRES) 0.1 MG tablet Take 1 tablet (0.1 mg total) by mouth 2 (two) times daily. 10/12/17  Yes Burnell Blanks, MD  clopidogrel (PLAVIX) 75 MG tablet Take 1 tablet (75 mg total) by mouth daily with  breakfast. 10/21/16  Yes Burnell Blanks, MD  CRESTOR 40 MG tablet TAKE 1 TABLET BY MOUTH DAILY Patient taking differently: Take 40 mg by mouth once a day 06/20/14  Yes Burnell Blanks, MD  ferrous sulfate 325 (65 FE) MG tablet Take 1 tablet (325 mg total) by mouth 2 (two) times daily with a meal. Patient taking differently: Take 325 mg by mouth daily.  06/02/15  Yes Regalado, Belkys A, MD  FLUoxetine (PROZAC) 20 MG capsule Take 20 mg by mouth daily.   Yes [provider]  furosemide (LASIX) 40 MG tablet Take 1 tablet (40 mg total) by mouth daily. Patient taking differently: Take  40 mg by mouth daily as needed for fluid or edema.  10/31/16 10/23/17 Yes Burnell Blanks, MD  ibuprofen (ADVIL,MOTRIN) 200 MG tablet Take 200-400 mg by mouth every 6 (six) hours as needed (for pain or headaches).   Yes [provider]  ipratropium (ATROVENT) 0.02 % nebulizer solution Take 0.5 mg by nebulization 2 (two) times daily.  04/08/13  Yes [provider]  isosorbide mononitrate (IMDUR) 30 MG 24 hr tablet Take 1 tablet (30 mg total) by mouth daily. 01/30/16  Yes Burnell Blanks, MD  metoprolol tartrate (LOPRESSOR) 50 MG tablet Take 1 tablet (50 mg total) by mouth 2 (two) times daily. 07/13/17  Yes Burnell Blanks, MD  minoxidil (LONITEN) 2.5 MG tablet Take 2.5 mg by mouth daily. 07/20/17  Yes [provider]  nitroGLYCERIN (NITROSTAT) 0.4 MG SL tablet Place 1 tablet (0.4 mg total) under the tongue every 5 (five) minutes as needed for chest pain. Patient taking differently: Place 0.4 mg under the tongue every morning.  03/31/17  Yes Burnell Blanks, MD  pantoprazole (PROTONIX) 40 MG tablet TAKE 1 TABLET EVERY DAY Patient taking differently: Take 40 mg by mouth once a day 08/03/15  Yes Burnell Blanks, MD  predniSONE (DELTASONE) 20 MG tablet Take 20 mg by mouth daily. FOR 7 DAYS 10/13/17  Yes [provider]  verapamil (COVERA HS) 240 MG (CO) 24 hr tablet Take 240 mg by mouth every evening.    Yes [provider]  zolpidem (AMBIEN) 10 MG tablet Take 10 mg by mouth at bedtime as needed for sleep.  12/27/15  Yes [provider]  HYDROcodone-acetaminophen (NORCO) 10-325 MG per tablet Take 1 tablet by mouth every 8 (eight) hours as needed. Patient not taking: Reported on 10/23/2017 06/02/15   Niel Hummer A, MD    Medications: . albuterol  2.5 mg Nebulization QID  . cloNIDine  0.1 mg Oral BID  . docusate sodium  100 mg Oral BID  . ferrous sulfate  325 mg Oral BID WC  . FLUoxetine  20 mg Oral Daily  . [START  ON 10/25/2017] Influenza vac split quadrivalent PF  0.5 mL Intramuscular Tomorrow-1000  . ipratropium  0.5 mg Nebulization BID  . isosorbide mononitrate  30 mg Oral Daily  . mouth rinse  15 mL Mouth Rinse BID  . metoprolol tartrate  50 mg Oral BID  . minoxidil  2.5 mg Oral Daily  . mometasone-formoterol  2 puff Inhalation BID  . pantoprazole  40 mg Oral Daily  . [START ON 10/25/2017] pneumococcal 23 valent vaccine  0.5 mL Intramuscular Tomorrow-1000  . rosuvastatin  40 mg Oral Daily  . sodium chloride flush  3 mL Intravenous Q12H  . verapamil  240 mg Oral QPM   Anti-infectives (From admission, onward)   Start  Dose/Rate Route Frequency Ordered Stop   10/24/17 0100  piperacillin-tazobactam (ZOSYN) IVPB 3.375 g     3.375 g 12.5 mL/hr over 240 Minutes Intravenous Every 8 hours 10/23/17 2041     10/23/17 1745  piperacillin-tazobactam (ZOSYN) IVPB 3.375 g     3.375 g 100 mL/hr over 30 Minutes Intravenous  Once 10/23/17 1739 10/23/17 1927     . sodium chloride    . piperacillin-tazobactam (ZOSYN)  IV 3.375 g (10/24/17 7412)   Assessment/Plan  Diverticulitis/possible colovaginal fistula CT 12/28: Sigmoid diverticulitis with adjacent fluid collection possible colovaginal fistula IR consult for fluid drainage ordered.   COPD/history of tobacco use CAD history of CABG Chronic diastolic congestive heart failure Atrial fibrillation -Plavix at home - last dose 10/22/17 Hypertension Moderate left renal artery stenosis History of scoliosis Arthritis Breast cancer  FEN: IV fluids/NPO ID: Zosyn 10/23/17 =>> day 2 DVT: SCDs Foley: Follow-up:  TBD   Plan: We have asked IR to see and evaluate for possible drainage of the fluid collection.  Agree with medical management at this point.  IR drain will most likely be tomorrow.     LOS: 1 day    Joanne [name] 10/24/2017 [phone]

## 2017-10-25 LAB — COMPREHENSIVE METABOLIC PANEL
ALBUMIN: 3.2 g/dL — AB (ref 3.5–5.0)
ALT: 14 U/L (ref 14–54)
AST: 16 U/L (ref 15–41)
Alkaline Phosphatase: 43 U/L (ref 38–126)
Anion gap: 8 (ref 5–15)
BUN: 15 mg/dL (ref 6–20)
CHLORIDE: 107 mmol/L (ref 101–111)
CO2: 25 mmol/L (ref 22–32)
CREATININE: 1.28 mg/dL — AB (ref 0.44–1.00)
Calcium: 8.6 mg/dL — ABNORMAL LOW (ref 8.9–10.3)
GFR calc Af Amer: 46 mL/min — ABNORMAL LOW (ref >60)
GFR, EST NON AFRICAN AMERICAN: 40 mL/min — AB (ref >60)
GLUCOSE: 95 mg/dL (ref 65–99)
POTASSIUM: 3.9 mmol/L (ref 3.5–5.1)
SODIUM: 140 mmol/L (ref 135–145)
Total Bilirubin: 1 mg/dL (ref 0.3–1.2)
Total Protein: 5.9 g/dL — ABNORMAL LOW (ref 6.5–8.1)

## 2017-10-25 LAB — CBC WITH DIFFERENTIAL/PLATELET
BASOS ABS: 0 10*3/uL (ref 0.0–0.1)
Basophils Relative: 0 %
EOS ABS: 0.2 10*3/uL (ref 0.0–0.7)
EOS PCT: 3 %
HCT: 29.8 % — ABNORMAL LOW (ref 36.0–46.0)
Hemoglobin: 8.1 g/dL — ABNORMAL LOW (ref 12.0–15.0)
LYMPHS ABS: 1.3 10*3/uL (ref 0.7–4.0)
Lymphocytes Relative: 18 %
MCH: 20.4 pg — ABNORMAL LOW (ref 26.0–34.0)
MCHC: 27.2 g/dL — ABNORMAL LOW (ref 30.0–36.0)
MCV: 74.9 fL — ABNORMAL LOW (ref 78.0–100.0)
MONO ABS: 0.6 10*3/uL (ref 0.1–1.0)
Monocytes Relative: 8 %
NEUTROS PCT: 71 %
Neutro Abs: 5.2 10*3/uL (ref 1.7–7.7)
PLATELETS: 254 10*3/uL (ref 150–400)
RBC: 3.98 MIL/uL (ref 3.87–5.11)
RDW: 17.3 % — AB (ref 11.5–15.5)
WBC: 7.3 10*3/uL (ref 4.0–10.5)

## 2017-10-25 LAB — PHOSPHORUS: Phosphorus: 4.4 mg/dL (ref 2.5–4.6)

## 2017-10-25 LAB — MAGNESIUM: MAGNESIUM: 2.2 mg/dL (ref 1.7–2.4)

## 2017-10-25 MED ORDER — DEXTROSE IN LACTATED RINGERS 5 % IV SOLN
INTRAVENOUS | Status: DC
  Administered 2017-10-25 – 2017-10-27 (×4): via INTRAVENOUS

## 2017-10-25 MED ORDER — HEPARIN SODIUM (PORCINE) 5000 UNIT/ML IJ SOLN
5000.0000 [IU] | Freq: Three times a day (TID) | INTRAMUSCULAR | Status: AC
  Administered 2017-10-25 – 2017-10-26 (×4): 5000 [IU] via SUBCUTANEOUS
  Filled 2017-10-25 (×4): qty 1

## 2017-10-25 NOTE — Progress Notes (Signed)
PROGRESS NOTE    Joanne [name]  NFA:[id number] DOB: 05-19-1941 DOA: 10/23/2017 PCP: Leanna Battles, MD   Brief Narrative:  Joanne [name] is a 76 y.o. female with medical history significant of CHF, CAD/CABG, Afib, copd, not on home supplemental oxygen, GERD, previous diverticulitis about 3 months resolved clinically with oral abx, comes in with over a month of lower abdominal pain which has progressed to discharge and fecal material from her vagina for over 3 weeks now.  Pt has seen her PCP during this time but did not discuss her acute issues with him because she was concerned that she has already had too much surgery.  Denies any fevers, but having chills.  No n/v/d.  Pt found to have diverticulitis with abscess and air in the vagina likely a fistula.  General surgery consulted recommending Medical management with IV Abx and IR Consulted and will likely place Drain on Tuesday. Patient started on IVF but will need to monitor cautiously given Heart Failure History.   Assessment & Plan:   Principal Problem:   Diverticulitis of large intestine with complication Active Problems:   Essential hypertension   COPD (chronic obstructive pulmonary disease) (HCC)   Renal artery stenosis (HCC)   Hyperlipidemia   Chronic diastolic CHF (congestive heart failure) (HCC)   Atherosclerosis of renal artery (HCC)   Chronic anemia  Acute Sigmoid Diverticulitis of Large Intestine with Complication including Abscess and Colovaginal Fistula -Admitted to Med Surge -CT Abd/Pelvis showed Sigmoid diverticulitis with adjacent tubular fluid collection suspicious for contained perforation and pelvic abscess. Poor fat plane between the inflamed sigmoid colon and the vaginal cuff on the left. There is gas in the vagina. 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 Small left pleural effusion. Aortic Atherosclerosis  -General Surgery Consulted and appreciate recommendations;  Recommending Medical management for now and Sips of Clears -IR Consulted and evaluated and ar planning on CT Guided Drainage/Drain Placement 10/27/17 as patient has to be off of Plavix for 5 days; Per last Cardio note she had her Plavix held x6 days for Back injection as last PCI was in 2015 -Holding aspirin products and anticoagulants for now until any surgical plan clearer. -C/w IV Zosyn as patient is still having Vaginal discharge  -Pain Control with po Tramadol as patient's Son adamantly does not want Opioids as he states his mother is an "opioid addict" -IVF with D5W LR at 75 mL/hr started by General Surgery  -Patient now having diarrhea; ? Abx induced. If significantly worsens will get GI Pathogen Panel and C Difficile Sample   Essential Hypertension  -C/w Clonidine 0.1 mg po BID, Minoxidil 2.5 mg Daily Verapamil 240 mg po qHS, Metoprolol 50 mg po BID -Given Hydralazine 20 mg IV once yesterday -Continue to Monitor   COPD (chronic obstructive pulmonary disease)  -Currently not in Exacerbation -C/w Albuterol 2.5 mg Neb 4 times daily and q6hprn, Atrovent 0.5 mg Neb BID -Repeat CXR in AM    Hyperlipidemia -C/w Rosuvastatin 40 mg po Daily     Chronic diastolic CHF (congestive heart failure) (HCC) -Was Euvolemic on Examination -BNP was 622.9 on Admission -Holding of IVF currently -Strict I's/O's, Daily Weights, SLIV  -Currently NPO and IVF with D5W LR started by General Surgery -Monitor Volume Status extremely closely  -Patient is -138.8 mL and  -Patient sees Dr. Angelena Form in [facility] and General Surgery requesting involvement for recommendations for Plavix;     Renal Insuffiencey in the setting of Atherosclerosis of renal  artery and Renal Artery Stenosis -Continue to Monitor BUN/Cr; Was 20/1.02 and increased to 15/1.28 -IVF with D5 LR started by General Surgery -Follows up with Vascular Surgery Dr. Donnetta Hutching as an outpatient  -Continue to Monitor CMP     Acute on Chronic Anemia -Hb/Hct  went from 8.6/31.8 -> 7.7/29.2 -> 8.1/29.8 -Anemia Panel showed Iron Level of 17, UIBC of 417, TIBC of 434, Saturation Ratios of 4, Ferritin of 13, Folate of 16.4, and Vitamin B12 fo 101 -She notes possible some blood from the vagina.   -Continue to Monitor for S/Sx of Bleeding -Check FOBT; Ordered and Pending -Continue to Monitor for S/Sx of Bleeding  -Repeat CBC in AM   Hx of CAD s/p CABG -Held Plavix for CT Guided Drain Placement (will be done 10/27/17 -C/w Isosorbide Mononitrate 30 mg po Daily and Rosuvastatin 40 mg po Daily  -Holding ASA and Plavix currently  -Continue to Monitor   Hx of Post-Operative A Fib -Last occurred in 2011 -Not on Anticoagulation; C/w Metoprolol 50 mg po BID -Continue to Monitor and if necessary place on Telemetry   Hx of Breast Cancer s/p Lumpectomy  -Outpatient Follow Up  DVT prophylaxis: SCD's; Started Heparin 5,000 units sq q8h Code Status: FULL CODE Family Communication: Discussed with Son at bedside Disposition Plan: [place] for continued Treatement  Consultants:   General Surgery  Interventional Radiology   Procedures: None  Antimicrobials:  Anti-infectives (From admission, onward)   Start     Dose/Rate Route Frequency Ordered Stop   10/24/17 0100  piperacillin-tazobactam (ZOSYN) IVPB 3.375 g     3.375 g 12.5 mL/hr over 240 Minutes Intravenous Every 8 hours 10/23/17 2041     10/23/17 1745  piperacillin-tazobactam (ZOSYN) IVPB 3.375 g     3.375 g 100 mL/hr over 30 Minutes Intravenous  Once 10/23/17 1739 10/23/17 1927     Subjective: Seen and examined at bedside and stated she was hungry and wanted to eat a hamburger. Having some diarrhea now and some drainage from vagina. No CP or SOB. Had some abdominal pain.   Objective: Vitals:   10/24/17 2217 10/25/17 0527 10/25/17 1533 10/25/17 1546  BP: (!) 138/46 134/65 (!) 112/52   Pulse: 61 63 66   Resp: 17 17 17    Temp: 98.3 F (36.8 C) 98.3 F (36.8 C) 98.3 F (36.8 C)     TempSrc: Oral  Oral   SpO2: 93% 95% 94% 93%  Weight:  74 kg (163 lb 2.3 oz)    Height:        Intake/Output Summary (Last 24 hours) at 10/25/2017 1916 Last data filed at 10/25/2017 1400 Gross per 24 hour  Intake 461.25 ml  Output -  Net 461.25 ml   Filed Weights   10/23/17 1117 10/25/17 0527  Weight: 63.5 kg (140 lb) 74 kg (163 lb 2.3 oz)   Examination: Physical Exam:  Constitutional: WN/WD Caucasian female in NAD appears calm and comfortable  Eyes: Sclerae anicteric. Lids normal ENMT: External Ears and Nose appear normal Neck: Supple with no JVD Respiratory: CTAB; No wheezing/rales/rhonchi. Patient was not tachypenic or using any accessory muscles to breathe  Cardiovascular: RRR; No appreciable m/r/g. No extremity edema noted Abdomen: Soft, Tender to palpate especially lower abdomen. ND. Bowel sounds present GU: Deferred Musculoskeletal: No contractures; No cyanosis Skin: Warm and Dry. No rashes or lesions on a limited skin eval Neurologic: CN 2-12 grossly intact. No appreciable focal deficits Psychiatric: Normal mood and affect. Intact judgement and insight  Data Reviewed:  I have personally reviewed following labs and imaging studies  CBC: Recent Labs  Lab 10/23/17 1133 10/24/17 0703 10/25/17 0423  WBC 11.9* 8.8 7.3  NEUTROABS  --   --  5.2  HGB 8.6* 7.7* 8.1*  HCT 31.8* 29.2* 29.8*  MCV 75.7* 73.9* 74.9*  PLT 295 277 355   Basic Metabolic Panel: Recent Labs  Lab 10/23/17 1133 10/24/17 0703 10/25/17 0423  NA 141 137 140  K 4.1 3.9 3.9  CL 106 106 107  CO2 25 23 25   GLUCOSE 110* 89 95  BUN 21* 20 15  CREATININE 1.01* 1.02* 1.28*  CALCIUM 8.7* 8.5* 8.6*  MG  --   --  2.2  PHOS  --   --  4.4   GFR: Estimated Creatinine Clearance: 35.8 mL/min (A) (by C-G formula based on SCr of 1.28 mg/dL (H)). Liver Function Tests: Recent Labs  Lab 10/23/17 1133 10/25/17 0423  AST 19 16  ALT 16 14  ALKPHOS 47 43  BILITOT 0.6 1.0  PROT 6.5 5.9*  ALBUMIN 3.7  3.2*   Recent Labs  Lab 10/23/17 1508  LIPASE 23   No results for input(s): AMMONIA in the last 168 hours. Coagulation Profile: Recent Labs  Lab 10/23/17 1508 10/24/17 0703  INR 1.04 1.09   Cardiac Enzymes: No results for input(s): CKTOTAL, CKMB, CKMBINDEX, TROPONINI in the last 168 hours. BNP (last 3 results) No results for input(s): PROBNP in the last 8760 hours. HbA1C: No results for input(s): HGBA1C in the last 72 hours. CBG: No results for input(s): GLUCAP in the last 168 hours. Lipid Profile: No results for input(s): CHOL, HDL, LDLCALC, TRIG, CHOLHDL, LDLDIRECT in the last 72 hours. Thyroid Function Tests: No results for input(s): TSH, T4TOTAL, FREET4, T3FREE, THYROIDAB in the last 72 hours. Anemia Panel: Recent Labs    10/23/17 1838 10/23/17 1850  VITAMINB12  --  101*  FOLATE 16.4  --   FERRITIN  --  13  TIBC  --  434  IRON  --  17*  RETICCTPCT  --  2.2   Sepsis Labs: Recent Labs  Lab 10/23/17 1547 10/23/17 1909  LATICACIDVEN 1.42 1.82    Recent Results (from the past 240 hour(s))  Urine culture     Status: Abnormal   Collection Time: 10/23/17  3:17 PM  Result Value Ref Range Status   Specimen Description URINE, CLEAN CATCH  Final   Special Requests NONE  Final   Culture MULTIPLE SPECIES PRESENT, SUGGEST RECOLLECTION (A)  Final   Report Status 10/24/2017 FINAL  Final  Blood culture (routine x 2)     Status: None (Preliminary result)   Collection Time: 10/23/17  5:40 PM  Result Value Ref Range Status   Specimen Description BLOOD BLOOD RIGHT WRIST  Final   Special Requests   Final    IN BOTH AEROBIC AND ANAEROBIC BOTTLES Blood Culture adequate volume   Culture NO GROWTH 2 DAYS  Final   Report Status PENDING  Incomplete  Blood culture (routine x 2)     Status: None (Preliminary result)   Collection Time: 10/23/17  5:45 PM  Result Value Ref Range Status   Specimen Description BLOOD RIGHT HAND  Final   Special Requests   Final    IN BOTH AEROBIC  AND ANAEROBIC BOTTLES Blood Culture adequate volume   Culture NO GROWTH 2 DAYS  Final   Report Status PENDING  Incomplete  Surgical PCR screen     Status: None   Collection Time:  10/24/17  6:52 AM  Result Value Ref Range Status   MRSA, PCR NEGATIVE NEGATIVE Final   Staphylococcus aureus NEGATIVE NEGATIVE Final    Comment: (NOTE) The Xpert SA Assay (FDA approved for NASAL specimens in patients 71 years of age and older), is one component of a comprehensive surveillance program. It is not intended to diagnose infection nor to guide or monitor treatment.     Radiology Studies: No results found. Scheduled Meds: . albuterol  2.5 mg Nebulization QID  . cloNIDine  0.1 mg Oral BID  . docusate sodium  100 mg Oral BID  . ferrous sulfate  325 mg Oral BID WC  . FLUoxetine  20 mg Oral Daily  . heparin injection (subcutaneous)  5,000 Units Subcutaneous Q8H  . ipratropium  0.5 mg Nebulization BID  . isosorbide mononitrate  30 mg Oral Daily  . mouth rinse  15 mL Mouth Rinse BID  . metoprolol tartrate  50 mg Oral BID  . minoxidil  2.5 mg Oral Daily  . mometasone-formoterol  2 puff Inhalation BID  . pantoprazole  40 mg Oral Daily  . rosuvastatin  40 mg Oral Daily  . sodium chloride flush  3 mL Intravenous Q12H  . verapamil  240 mg Oral QPM   Continuous Infusions: . sodium chloride    . dextrose 5% lactated ringers 75 mL/hr at 10/25/17 0951  . piperacillin-tazobactam (ZOSYN)  IV 3.375 g (10/25/17 1716)    LOS: 2 days   Kerney Elbe, DO [facility] Pager [phone]  If 7PM-7AM, please contact night-coverage [email or web address] Password [email or web address] 10/25/2017, 7:16 PM

## 2017-10-25 NOTE — Progress Notes (Signed)
CC: abdominal pain and vaginal drainage that is malodorous   Subjective: Her abdomen feels better.  She is starting to have diarrhea now.  She also continues to have drainage through her vagina.  Objective: Vital signs in last 24 hours: Temp:  [97.7 F (36.5 C)-98.3 F (36.8 C)] 98.3 F (36.8 C) (12/30 0527) Pulse Rate:  [61-66] 63 (12/30 0527) Resp:  [17-20] 17 (12/30 0527) BP: (132-138)/(46-65) 134/65 (12/30 0527) SpO2:  [93 %-97 %] 95 % (12/30 0527) Weight:  [74 kg (163 lb 2.3 oz)] 74 kg (163 lb 2.3 oz) (12/30 0527)  NPO 50 IV recorded Voided x 2 Afebrile, VSS Creatinine is up to 1.28  - Current IV is at [facility] WBC is stable Intake/Output from previous day: 12/29 0701 - 12/30 0700 In: 50 [IV Piggyback:50] Out: -  Intake/Output this shift: No intake/output data recorded.  General appearance: alert, cooperative and no distress Resp: clear to auscultation bilaterally GI: Her abdominal pain is better.  Having diarrhea and ongoing drainage through her vagina.  Lab Results:  Recent Labs    10/24/17 0703 10/25/17 0423  WBC 8.8 7.3  HGB 7.7* 8.1*  HCT 29.2* 29.8*  PLT 277 254    BMET Recent Labs    10/24/17 0703 10/25/17 0423  NA 137 140  K 3.9 3.9  CL 106 107  CO2 23 25  GLUCOSE 89 95  BUN 20 15  CREATININE 1.02* 1.28*  CALCIUM 8.5* 8.6*   PT/INR Recent Labs    10/23/17 1508 10/24/17 0703  LABPROT 13.5 14.0  INR 1.04 1.09    Recent Labs  Lab 10/23/17 1133 10/25/17 0423  AST 19 16  ALT 16 14  ALKPHOS 47 43  BILITOT 0.6 1.0  PROT 6.5 5.9*  ALBUMIN 3.7 3.2*     Lipase     Component Value Date/Time   LIPASE 23 10/23/2017 1508     Medications: . albuterol  2.5 mg Nebulization QID  . cloNIDine  0.1 mg Oral BID  . docusate sodium  100 mg Oral BID  . ferrous sulfate  325 mg Oral BID WC  . FLUoxetine  20 mg Oral Daily  . Influenza vac split quadrivalent PF  0.5 mL Intramuscular Tomorrow-1000  . ipratropium  0.5 mg Nebulization BID   . isosorbide mononitrate  30 mg Oral Daily  . mouth rinse  15 mL Mouth Rinse BID  . metoprolol tartrate  50 mg Oral BID  . minoxidil  2.5 mg Oral Daily  . mometasone-formoterol  2 puff Inhalation BID  . pantoprazole  40 mg Oral Daily  . pneumococcal 23 valent vaccine  0.5 mL Intramuscular Tomorrow-1000  . rosuvastatin  40 mg Oral Daily  . sodium chloride flush  3 mL Intravenous Q12H  . verapamil  240 mg Oral QPM   . sodium chloride    . piperacillin-tazobactam (ZOSYN)  IV Stopped (10/25/17 0535)   Anti-infectives (From admission, onward)   Start     Dose/Rate Route Frequency Ordered Stop   10/24/17 0100  piperacillin-tazobactam (ZOSYN) IVPB 3.375 g     3.375 g 12.5 mL/hr over 240 Minutes Intravenous Every 8 hours 10/23/17 2041     10/23/17 1745  piperacillin-tazobactam (ZOSYN) IVPB 3.375 g     3.375 g 100 mL/hr over 30 Minutes Intravenous  Once 10/23/17 1739 10/23/17 1927     Assessment/Plan Diverticulitis/possible colovaginal fistula CT 12/28: Sigmoid diverticulitis with adjacent fluid collection possible colovaginal fistula IR consult for fluid drainage ordered. - On  hold till 10/27/17 related to Plavix onboard   COPD/history of tobacco use CAD history of CABG Chronic diastolic congestive heart failure (Echo 05/2015 EF 15-05%, grade 2 diastolic dysfunction/trivial AR, Mild MR,Mod TR) Atrial fibrillation -Plavix at home - last dose 10/22/17 Mild renal insuffiencey - creatinine up to 1.28 10/25/17 Hypertension Moderate left renal artery stenosis History of scoliosis Arthritis Breast cancer  FEN: IV fluids/NPO ID: Zosyn 10/23/17 =>> day 3 DVT: SCDs Foley: Follow-up:  TBD  Plan:  Ongoing abx, I have added some IV fluids @ 75 ml/hr, she is not having her drain placed because of the Plavix, but I would think she needs something for DVT until she is to have her drain.  Will defer to Dr. Alfredia Ferguson.        LOS: 2 days    Eyad Rochford 10/25/2017 [phone]

## 2017-10-26 DIAGNOSIS — E785 Hyperlipidemia, unspecified: Secondary | ICD-10-CM

## 2017-10-26 LAB — CBC WITH DIFFERENTIAL/PLATELET
BASOS ABS: 0 10*3/uL (ref 0.0–0.1)
BASOS PCT: 0 %
EOS PCT: 3 %
Eosinophils Absolute: 0.3 10*3/uL (ref 0.0–0.7)
HCT: 27.9 % — ABNORMAL LOW (ref 36.0–46.0)
Hemoglobin: 7.7 g/dL — ABNORMAL LOW (ref 12.0–15.0)
Lymphocytes Relative: 10 %
Lymphs Abs: 0.8 10*3/uL (ref 0.7–4.0)
MCH: 20.6 pg — ABNORMAL LOW (ref 26.0–34.0)
MCHC: 27.6 g/dL — AB (ref 30.0–36.0)
MCV: 74.6 fL — AB (ref 78.0–100.0)
MONO ABS: 0.8 10*3/uL (ref 0.1–1.0)
MONOS PCT: 11 %
Neutro Abs: 6 10*3/uL (ref 1.7–7.7)
Neutrophils Relative %: 76 %
PLATELETS: 217 10*3/uL (ref 150–400)
RBC: 3.74 MIL/uL — ABNORMAL LOW (ref 3.87–5.11)
RDW: 17.5 % — AB (ref 11.5–15.5)
WBC: 7.8 10*3/uL (ref 4.0–10.5)

## 2017-10-26 LAB — COMPREHENSIVE METABOLIC PANEL
ALT: 13 U/L — ABNORMAL LOW (ref 14–54)
ANION GAP: 11 (ref 5–15)
AST: 15 U/L (ref 15–41)
Albumin: 3 g/dL — ABNORMAL LOW (ref 3.5–5.0)
Alkaline Phosphatase: 40 U/L (ref 38–126)
BUN: 8 mg/dL (ref 6–20)
CHLORIDE: 106 mmol/L (ref 101–111)
CO2: 24 mmol/L (ref 22–32)
Calcium: 8.3 mg/dL — ABNORMAL LOW (ref 8.9–10.3)
Creatinine, Ser: 1.17 mg/dL — ABNORMAL HIGH (ref 0.44–1.00)
GFR calc Af Amer: 51 mL/min — ABNORMAL LOW (ref >60)
GFR, EST NON AFRICAN AMERICAN: 44 mL/min — AB (ref >60)
Glucose, Bld: 120 mg/dL — ABNORMAL HIGH (ref 65–99)
POTASSIUM: 3.1 mmol/L — AB (ref 3.5–5.1)
Sodium: 141 mmol/L (ref 135–145)
TOTAL PROTEIN: 5.3 g/dL — AB (ref 6.5–8.1)
Total Bilirubin: 0.7 mg/dL (ref 0.3–1.2)

## 2017-10-26 LAB — PHOSPHORUS: PHOSPHORUS: 3.6 mg/dL (ref 2.5–4.6)

## 2017-10-26 LAB — MAGNESIUM: MAGNESIUM: 2.1 mg/dL (ref 1.7–2.4)

## 2017-10-26 MED ORDER — POTASSIUM CHLORIDE CRYS ER 20 MEQ PO TBCR
40.0000 meq | EXTENDED_RELEASE_TABLET | ORAL | Status: AC
  Administered 2017-10-26 (×2): 40 meq via ORAL
  Filled 2017-10-26 (×2): qty 2

## 2017-10-26 MED ORDER — BACID PO TABS
2.0000 | ORAL_TABLET | Freq: Three times a day (TID) | ORAL | Status: DC
  Administered 2017-10-26 – 2017-10-28 (×8): 2 via ORAL
  Filled 2017-10-26 (×9): qty 2

## 2017-10-26 MED ORDER — HEPARIN SODIUM (PORCINE) 5000 UNIT/ML IJ SOLN
5000.0000 [IU] | Freq: Three times a day (TID) | INTRAMUSCULAR | Status: DC
  Administered 2017-10-28: 5000 [IU] via SUBCUTANEOUS
  Filled 2017-10-26 (×2): qty 1

## 2017-10-26 NOTE — Progress Notes (Signed)
   Subjective/Chief Complaint: Pt with some diarrhea Min abd pain   Objective: Vital signs in last 24 hours: Temp:  [98.1 F (36.7 C)-98.8 F (37.1 C)] 98.1 F (36.7 C) (12/31 0447) Pulse Rate:  [61-69] 61 (12/31 0447) Resp:  [16-17] 16 (12/31 0447) BP: (112-128)/(51-55) 128/55 (12/31 0447) SpO2:  [93 %-95 %] 95 % (12/31 0447) Last BM Date: 10/26/17  Intake/Output from previous day: 12/30 0701 - 12/31 0700 In: 461.3 [I.V.:311.3; IV Piggyback:150] Out: -  Intake/Output this shift: No intake/output data recorded.   Constitutional: No acute distress, conversant, appears states age. Eyes: Anicteric sclerae, moist conjunctiva, no lid lag Lungs: Clear to auscultation bilaterally, normal respiratory effort CV: regular rate and rhythm, no murmurs, no peripheral edema, pedal pulses 2+ GI: Soft, no masses or hepatosplenomegaly, non-tender to palpation, no rebound/guarding Skin: No rashes, palpation reveals normal turgor Psychiatric: appropriate judgment and insight, oriented to person, place, and time   Lab Results:  Recent Labs    10/25/17 0423 10/26/17 0419  WBC 7.3 7.8  HGB 8.1* 7.7*  HCT 29.8* 27.9*  PLT 254 217   BMET Recent Labs    10/25/17 0423 10/26/17 0419  NA 140 141  K 3.9 3.1*  CL 107 106  CO2 25 24  GLUCOSE 95 120*  BUN 15 8  CREATININE 1.28* 1.17*  CALCIUM 8.6* 8.3*   PT/INR Recent Labs    10/23/17 1508 10/24/17 0703  LABPROT 13.5 14.0  INR 1.04 1.09   Anti-infectives: Anti-infectives (From admission, onward)   Start     Dose/Rate Route Frequency Ordered Stop   10/24/17 0100  piperacillin-tazobactam (ZOSYN) IVPB 3.375 g     3.375 g 12.5 mL/hr over 240 Minutes Intravenous Every 8 hours 10/23/17 2041     10/23/17 1745  piperacillin-tazobactam (ZOSYN) IVPB 3.375 g     3.375 g 100 mL/hr over 30 Minutes Intravenous  Once 10/23/17 1739 10/23/17 1927      Assessment/Plan: Diverticulitis/possible colovaginal fistula CT 12/28: Sigmoid  diverticulitis with adjacent fluid collection possible colovaginal fistula IR consult for fluid drainage ordered. - On hold till 10/27/17 related to Plavix onboard   COPD/history of tobacco use CAD history of CABG Chronic diastolic congestive heart failure (Echo 05/2015 EF 15-83%, grade 2 diastolic dysfunction/trivial AR, Mild MR,Mod TR) Atrial fibrillation-Plavix at home- last dose 10/22/17 Mild renal insuffiencey Hypertension Moderate left renal artery stenosis History of scoliosis Arthritis Breast cancer  Plan once drain placed will be able to f/u with Dr. Dema Severin as outpt. Will need outpt abx x 2 weeks  Don't expect there to be any acute surgical plans. Will add probiotics to help with diarrhea   LOS: 3 days    Rosario Jacks., [facility] 10/26/2017

## 2017-10-26 NOTE — Progress Notes (Signed)
ANTIBIOTIC CONSULT NOTE  Pharmacy Consult for Zosyn Indication: Diverticulitis, colovaginal fistula  Allergies  Allergen Reactions  . Iohexol Anaphylaxis, Shortness Of Breath and Swelling     Code: SOB, Desc: ct @ ser w/ throat swelling & sob, ok w/ 13 hr prep//a.c., Onset Date: [id number]   . Penicillins Anaphylaxis    Tolerated Zosyn 09/2017.  TDD.  Has patient had a PCN reaction causing immediate rash, facial/tongue/throat swelling, SOB or lightheadedness with hypotension: Yes Has patient had a PCN reaction causing severe rash involving mucus membranes or skin necrosis: No Has patient had a PCN reaction that required hospitalization: No Has patient had a PCN reaction occurring within the last 10 years: No If all of the above answers are "NO", then may proceed with Cephalosporin use.     Patient Measurements: Height: 5\' 2"  (157.5 cm) Weight: 163 lb 2.3 oz (74 kg) IBW/kg (Calculated) : 50.1 Adjusted Body Weight:   Vital Signs: Temp: 98.1 F (36.7 C) (12/31 0447) Temp Source: Oral (12/31 0447) BP: 128/55 (12/31 0447) Pulse Rate: 61 (12/31 0447) Intake/Output from previous day: 12/30 0701 - 12/31 0700 In: 461.3 [I.V.:311.3; IV Piggyback:150] Out: -  Intake/Output from this shift: No intake/output data recorded.  Labs: Recent Labs    10/24/17 0703 10/25/17 0423 10/26/17 0419  WBC 8.8 7.3 7.8  HGB 7.7* 8.1* 7.7*  PLT 277 254 217  CREATININE 1.02* 1.28* 1.17*   Estimated Creatinine Clearance: 39.2 mL/min (A) (by C-G formula based on SCr of 1.17 mg/dL (H)). No results for input(s): VANCOTROUGH, VANCOPEAK, VANCORANDOM, GENTTROUGH, GENTPEAK, GENTRANDOM, TOBRATROUGH, TOBRAPEAK, TOBRARND, AMIKACINPEAK, AMIKACINTROU, AMIKACIN in the last 72 hours.   Microbiology:   Medical History: Past Medical History:  Diagnosis Date  . Arthritis    "all over"  . Atrial fibrillation ([place])   . Breast cancer ([place]) 1997   "left"  . Chronic diastolic CHF (congestive heart failure)  ([place])    a. 08/2010 Echo: EF 65-70%, Gr 1 DD, Mild MR  . Chronic lower back pain   . COPD (chronic obstructive pulmonary disease) ([place])   . Coronary artery disease    a. 11/2009 CABGx3: LIMA->LAD, VG->RCA, VG->OM2. b. Cath 10/11/2014 EF 50-55%,  patent SVG to OM2, occlueded SVG to OM1, atretic and nonfunctional LIMA to LAD, 90% ostial LAD lesion treated with PTCA/LAD  . GERD (gastroesophageal reflux disease)   . Heart murmur   . History of blood transfusion 11/2009   "related to OHS"   . Hyperlipidemia   . Hypertension   . Left renal artery stenosis ([place])    a. Moderate L RAS  . Myocardial infarction ([place]) 2011  . Scoliosis of thoracic spine     Assessment: Diverticulitis/possible colovaginal fistula. Abx day #4. Afebrile. WBC 7.8. Scr 1.17 stable.  Zosyn 12/28 >>  12/28 UCx - mult spp 12/28 BCx - NGTD 12/29 surgical PCR - negative  Goal of Therapy:  Eradication of infection  Plan:  Zosyn 3.375g IV q 8hrs. Pharmacy will sign off. Please reconsult for further dosing assitance.   Amelianna Meller S. Alford Highland, PharmD, BCPS Clinical Staff Pharmacist Pager [phone]  [place], [place] 10/26/2017,8:21 AM

## 2017-10-26 NOTE — Progress Notes (Signed)
Phoned Pharmacy and confirmed to continue to run the 9 AM Zosyn that was started at [place].

## 2017-10-26 NOTE — Progress Notes (Signed)
PROGRESS NOTE    Joanne [name]  WUJ:[id number] DOB: [birth year] DOA: 10/23/2017 PCP: Leanna Battles, MD   Brief Narrative: Joanne [name] is a 76 y.o. femalewith medical history significant ofCHF, CAD/CABG, Afib, copd, not on home supplemental oxygen, GERD, previous diverticulitis about 3 months resolved clinically with oral abx, comes in with over a month of lower abdominal pain which has progressed to discharge and fecal material from her vagina for over 3 weeks now. Pt has seen her PCP during this time but did not discuss her acute issues with him because she was concerned that she has already had too much surgery. Denies any fevers, but having chills. No n/v/d. Pt found to have diverticulitis with abscess and air in the vagina likely a fistula. General surgery consulted recommending Medical management with IV Abx and IR Consulted and will likely place Drain on Tuesday. Patient started on IVF but will need to monitor cautiously given Heart Failure History.    Assessment & Plan:   Principal Problem:   Diverticulitis of large intestine with complication Active Problems:   Essential hypertension   COPD (chronic obstructive pulmonary disease) (HCC)   Renal artery stenosis (HCC)   Hyperlipidemia   Chronic diastolic CHF (congestive heart failure) (HCC)   Atherosclerosis of renal artery (HCC)   Chronic anemia   Acute Sigmoid Diverticulitis of Large Intestine with Complication including Abscess and Colovaginal Fistula Seen on CT scan. IR to perform aspiration on 1/1 -Continue Zosyn   Essential Hypertension  -C/w Clonidine 0.1 mg po BID, Minoxidil 2.5 mg Daily Verapamil 240 mg po qHS, Metoprolol 50 mg po BID -Continue to Monitor  COPD (chronic obstructive pulmonary disease)  -Currently not in exacerbation -C/w Albuterol 2.5 mg Neb 4 times daily and q6hprn, Atrovent 0.5 mg Neb BID  Hyperlipidemia -C/w Rosuvastatin 40 mg po Daily   Chronic diastolic CHF (congestive heart  failure) (HCC) Stable. Euvolemic.   Renal Insuffiencey in the setting of Atherosclerosis of renal artery and Renal Artery Stenosis -Continue to Monitor BUN/Cr; Was 20/1.02 and increased to 15/1.28 -IVF with D5 LR started by General Surgery -Follows up with Vascular Surgery Dr. Donnetta Hutching as an outpatient  -Continue to Monitor CMP   Acute on Chronic Anemia -Hb/Hct went from 8.6/31.8 -> 7.7/29.2 -> 8.1/29.8 -Anemia Panel showed Iron Level of 17, UIBC of 417, TIBC of 434, Saturation Ratios of 4, Ferritin of 13, Folate of 16.4, and Vitamin B12 fo 101 -fecal occult blood test pending   Hx of CAD s/p CABG -Held Plavix for CT Guided Drain Placement (will be done 10/27/17 -C/w Isosorbide Mononitrate 30 mg po Daily and Rosuvastatin 40 mg po Daily  -Holding ASA and Plavix currently  -Continue to Monitor   Hx of Post-Operative A Fib -Last occurred in 2011 -Not on Anticoagulation; C/w Metoprolol 50 mg po BID -Continue to Monitor and if necessary place on Telemetry   Hx of Breast Cancer s/p Lumpectomy  -Outpatient Follow Up    DVT prophylaxis: Heparin subq Code Status: Full code Family Communication: Sons and daughter in law at bedside Disposition Plan: Possible discharge in 24-48 hours   Consultants:   General surgery  Interventional radiology  Procedures:   None  Antimicrobials:  Zosyn    Subjective: Diarrhea.  Objective: Vitals:   10/25/17 2116 10/26/17 0447 10/26/17 0901 10/26/17 1012  BP: (!) 124/51 (!) 128/55    Pulse: 69 61  65  Resp:  16    Temp: 98.8 F (37.1 C) 98.1 F (36.7 C)  TempSrc: Oral Oral    SpO2: 95% 95% 97%   Weight:      Height:        Intake/Output Summary (Last 24 hours) at 10/26/2017 1038 Last data filed at 10/25/2017 1400 Gross per 24 hour  Intake 461.25 ml  Output -  Net 461.25 ml   Filed Weights   10/23/17 1117 10/25/17 0527  Weight: 63.5 kg (140 lb) 74 kg (163 lb 2.3 oz)    Examination:  General exam: Appears calm and  comfortable Respiratory system: Clear to auscultation. Respiratory effort normal. Cardiovascular system: S1 & S2 heard, RRR. No murmurs, rubs, gallops or clicks. Gastrointestinal system: Abdomen is nondistended, soft and mildly tender in periumbilical region. Normal bowel sounds heard. Central nervous system: Alert and oriented. No focal neurological deficits. Extremities: No edema. No calf tenderness Skin: No cyanosis. No rashes Psychiatry: Judgement and insight appear normal. Mood & affect appropriate.     Data Reviewed: I have personally reviewed following labs and imaging studies  CBC: Recent Labs  Lab 10/23/17 1133 10/24/17 0703 10/25/17 0423 10/26/17 0419  WBC 11.9* 8.8 7.3 7.8  NEUTROABS  --   --  5.2 6.0  HGB 8.6* 7.7* 8.1* 7.7*  HCT 31.8* 29.2* 29.8* 27.9*  MCV 75.7* 73.9* 74.9* 74.6*  PLT 295 277 254 993   Basic Metabolic Panel: Recent Labs  Lab 10/23/17 1133 10/24/17 0703 10/25/17 0423 10/26/17 0419  NA 141 137 140 141  K 4.1 3.9 3.9 3.1*  CL 106 106 107 106  CO2 25 23 25 24   GLUCOSE 110* 89 95 120*  BUN 21* 20 15 8   CREATININE 1.01* 1.02* 1.28* 1.17*  CALCIUM 8.7* 8.5* 8.6* 8.3*  MG  --   --  2.2 2.1  PHOS  --   --  4.4 3.6   GFR: Estimated Creatinine Clearance: 39.2 mL/min (A) (by C-G formula based on SCr of 1.17 mg/dL (H)). Liver Function Tests: Recent Labs  Lab 10/23/17 1133 10/25/17 0423 10/26/17 0419  AST 19 16 15   ALT 16 14 13*  ALKPHOS 47 43 40  BILITOT 0.6 1.0 0.7  PROT 6.5 5.9* 5.3*  ALBUMIN 3.7 3.2* 3.0*   Recent Labs  Lab 10/23/17 1508  LIPASE 23   No results for input(s): AMMONIA in the last 168 hours. Coagulation Profile: Recent Labs  Lab 10/23/17 1508 10/24/17 0703  INR 1.04 1.09   Cardiac Enzymes: No results for input(s): CKTOTAL, CKMB, CKMBINDEX, TROPONINI in the last 168 hours. BNP (last 3 results) No results for input(s): PROBNP in the last 8760 hours. HbA1C: No results for input(s): HGBA1C in the last 72  hours. CBG: No results for input(s): GLUCAP in the last 168 hours. Lipid Profile: No results for input(s): CHOL, HDL, LDLCALC, TRIG, CHOLHDL, LDLDIRECT in the last 72 hours. Thyroid Function Tests: No results for input(s): TSH, T4TOTAL, FREET4, T3FREE, THYROIDAB in the last 72 hours. Anemia Panel: Recent Labs    10/23/17 1838 10/23/17 1850  VITAMINB12  --  101*  FOLATE 16.4  --   FERRITIN  --  13  TIBC  --  434  IRON  --  17*  RETICCTPCT  --  2.2   Sepsis Labs: Recent Labs  Lab 10/23/17 1547 10/23/17 1909  LATICACIDVEN 1.42 1.82    Recent Results (from the past 240 hour(s))  Urine culture     Status: Abnormal   Collection Time: 10/23/17  3:17 PM  Result Value Ref Range Status   Specimen Description URINE, CLEAN  CATCH  Final   Special Requests NONE  Final   Culture MULTIPLE SPECIES PRESENT, SUGGEST RECOLLECTION (A)  Final   Report Status 10/24/2017 FINAL  Final  Blood culture (routine x 2)     Status: None (Preliminary result)   Collection Time: 10/23/17  5:40 PM  Result Value Ref Range Status   Specimen Description BLOOD BLOOD RIGHT WRIST  Final   Special Requests   Final    IN BOTH AEROBIC AND ANAEROBIC BOTTLES Blood Culture adequate volume   Culture NO GROWTH 2 DAYS  Final   Report Status PENDING  Incomplete  Blood culture (routine x 2)     Status: None (Preliminary result)   Collection Time: 10/23/17  5:45 PM  Result Value Ref Range Status   Specimen Description BLOOD RIGHT HAND  Final   Special Requests   Final    IN BOTH AEROBIC AND ANAEROBIC BOTTLES Blood Culture adequate volume   Culture NO GROWTH 2 DAYS  Final   Report Status PENDING  Incomplete  Surgical PCR screen     Status: None   Collection Time: 10/24/17  6:52 AM  Result Value Ref Range Status   MRSA, PCR NEGATIVE NEGATIVE Final   Staphylococcus aureus NEGATIVE NEGATIVE Final    Comment: (NOTE) The Xpert SA Assay (FDA approved for NASAL specimens in patients 54 years of age and older), is one  component of a comprehensive surveillance program. It is not intended to diagnose infection nor to guide or monitor treatment.          Radiology Studies: No results found.      Scheduled Meds: . cloNIDine  0.1 mg Oral BID  . docusate sodium  100 mg Oral BID  . ferrous sulfate  325 mg Oral BID WC  . FLUoxetine  20 mg Oral Daily  . heparin injection (subcutaneous)  5,000 Units Subcutaneous Q8H  . [START ON 10/28/2017] heparin injection (subcutaneous)  5,000 Units Subcutaneous Q8H  . isosorbide mononitrate  30 mg Oral Daily  . lactobacillus acidophilus  2 tablet Oral TID  . mouth rinse  15 mL Mouth Rinse BID  . metoprolol tartrate  50 mg Oral BID  . minoxidil  2.5 mg Oral Daily  . mometasone-formoterol  2 puff Inhalation BID  . pantoprazole  40 mg Oral Daily  . potassium chloride  40 mEq Oral Q4H  . rosuvastatin  40 mg Oral Daily  . sodium chloride flush  3 mL Intravenous Q12H  . verapamil  240 mg Oral QPM   Continuous Infusions: . sodium chloride    . dextrose 5% lactated ringers 75 mL/hr at 10/25/17 2255  . piperacillin-tazobactam (ZOSYN)  IV 3.375 g (10/26/17 0800)     LOS: 3 days     Cordelia Poche, MD [facility] 10/26/2017, 10:38 AM Pager: [phone]  If 7PM-7AM, please contact night-coverage [email or web address] Password [email or web address] 10/26/2017, 10:38 AM

## 2017-10-27 ENCOUNTER — Inpatient Hospital Stay (HOSPITAL_COMMUNITY): Payer: Medicare HMO

## 2017-10-27 LAB — BASIC METABOLIC PANEL WITH GFR
Anion gap: 8 (ref 5–15)
BUN: <5 mg/dL — ABNORMAL LOW (ref 6–20)
CO2: 19 mmol/L — ABNORMAL LOW (ref 22–32)
Calcium: 8.4 mg/dL — ABNORMAL LOW (ref 8.9–10.3)
Chloride: 111 mmol/L (ref 101–111)
Creatinine, Ser: 1.15 mg/dL — ABNORMAL HIGH (ref 0.44–1.00)
GFR calc Af Amer: 53 mL/min — ABNORMAL LOW
GFR calc non Af Amer: 45 mL/min — ABNORMAL LOW
Glucose, Bld: 156 mg/dL — ABNORMAL HIGH (ref 65–99)
Potassium: 3.6 mmol/L (ref 3.5–5.1)
Sodium: 138 mmol/L (ref 135–145)

## 2017-10-27 LAB — C DIFFICILE QUICK SCREEN W PCR REFLEX
C DIFFICILE (CDIFF) INTERP: NOT DETECTED
C DIFFICILE (CDIFF) TOXIN: NEGATIVE
C Diff antigen: NEGATIVE

## 2017-10-27 MED ORDER — LOPERAMIDE HCL 2 MG PO CAPS: 2.0000 mg | ORAL_CAPSULE | ORAL | Status: DC | PRN

## 2017-10-27 MED ORDER — FENTANYL CITRATE (PF) 100 MCG/2ML IJ SOLN
INTRAMUSCULAR | Status: AC | PRN
  Administered 2017-10-27 (×2): 50 ug via INTRAVENOUS
  Administered 2017-10-27: 25 ug via INTRAVENOUS

## 2017-10-27 MED ORDER — MIDAZOLAM HCL 2 MG/2ML IJ SOLN
INTRAMUSCULAR | Status: AC | PRN
  Administered 2017-10-27 (×2): 1 mg via INTRAVENOUS
  Administered 2017-10-27: 0.5 mg via INTRAVENOUS

## 2017-10-27 MED ORDER — FENTANYL CITRATE (PF) 100 MCG/2ML IJ SOLN
INTRAMUSCULAR | Status: AC
  Filled 2017-10-27: qty 4

## 2017-10-27 MED ORDER — LIDOCAINE HCL 1 % IJ SOLN
INTRAMUSCULAR | Status: AC
  Filled 2017-10-27: qty 20

## 2017-10-27 MED ORDER — MIDAZOLAM HCL 2 MG/2ML IJ SOLN
INTRAMUSCULAR | Status: AC
  Filled 2017-10-27: qty 4

## 2017-10-27 NOTE — Sedation Documentation (Signed)
Patient is resting comfortably. 

## 2017-10-27 NOTE — Procedures (Signed)
Pelvic fluid aspiration 30 cc clear yellow fluid. No drain EBL 0 Comp 0

## 2017-10-27 NOTE — Progress Notes (Addendum)
PROGRESS NOTE    DALISHA [name]  JKK:[id number] DOB: November 18, 1940 DOA: 10/23/2017 PCP: Leanna Battles, MD   Brief Narrative: Joanne [name] is a 77 y.o. femalewith medical history significant ofCHF, CAD/CABG, Afib, copd, not on home supplemental oxygen, GERD, previous diverticulitis about 3 months resolved clinically with oral abx, comes in with over a month of lower abdominal pain which has progressed to discharge and fecal material from her vagina for over 3 weeks now. Pt has seen her PCP during this time but did not discuss her acute issues with him because she was concerned that she has already had too much surgery. Denies any fevers, but having chills. No n/v/d. Pt found to have diverticulitis with abscess and air in the vagina likely a fistula. General surgery consulted recommending Medical management with IV Abx and IR Consulted and will likely place Drain on Tuesday. Patient started on IVF but will need to monitor cautiously given Heart Failure History.    Assessment & Plan:   Principal Problem:   Diverticulitis of large intestine with complication Active Problems:   Essential hypertension   COPD (chronic obstructive pulmonary disease) (HCC)   Renal artery stenosis (HCC)   Hyperlipidemia   Chronic diastolic CHF (congestive heart failure) (HCC)   Atherosclerosis of renal artery (HCC)   Chronic anemia   Acute Sigmoid Diverticulitis of Large Intestine with Complication including Abscess and Colovaginal Fistula Seen on CT scan. IR performed aspiration on 1/1 without drain placement secondary to minimal fluid. Aspirate culture pending. Plavix held secondary to procedure. -Continue Zosyn   Essential Hypertension  -C/w Clonidine 0.1 mg po BID, Minoxidil 2.5 mg Daily Verapamil 240 mg po qHS, Metoprolol 50 mg po BID -Continue to Monitor  COPD (chronic obstructive pulmonary disease)  -Currently not in exacerbation -C/w Albuterol 2.5 mg Neb 4 times daily and q6hprn, Atrovent 0.5  mg Neb BID  Hyperlipidemia -C/w Rosuvastatin 40 mg po Daily   Chronic diastolic CHF (congestive heart failure) (HCC) Stable. Euvolemic.   Renal Insuffiencey in the setting of Atherosclerosis of renal artery and Renal Artery Stenosis -Continue to Monitor BUN/Cr; Was 20/1.02 and increased to 15/1.28 -IVF with D5 LR started by General Surgery -Follows up with Vascular Surgery Dr. Donnetta Hutching as an outpatient  -Continue to Monitor CMP   Acute on Chronic Anemia Stable. Iron panel suggests chronic disease but possible component of iron deficiency. -fecal occult blood test pending   Hx of CAD s/p CABG -Held Plavix for CT Guided Drain Placement (will be done 10/27/17 -C/w Isosorbide Mononitrate 30 mg po Daily and Rosuvastatin 40 mg po Daily  -Holding ASA and Plavix currently  -Continue to Monitor  Hx of Post-Operative A Fib -Last occurred in 2011 -Not on Anticoagulation; C/w Metoprolol 50 mg po BID -Continue to Monitor and if necessary place on Telemetry  Hx of Breast Cancer s/p Lumpectomy  -Outpatient Follow Up    DVT prophylaxis: Heparin subq Code Status: Full code Family Communication: Sons and daughter in law at bedside Disposition Plan: Discharge pending aspirate culture results   Consultants:   General surgery  Interventional radiology  Procedures:   None  Antimicrobials:  Zosyn    Subjective: Diarrhea today. No abdominal pain.  Objective: Vitals:   10/27/17 0853 10/27/17 0857 10/27/17 0903 10/27/17 0929  BP: (!) 173/88 (!) 158/77 (!) 152/69 135/61  Pulse: 75 72 78 68  Resp: 20 (!) 21 12 16   Temp:    97.7 F (36.5 C)  TempSrc:    Oral  SpO2: 100%  100% 98% 96%  Weight:      Height:        Intake/Output Summary (Last 24 hours) at 10/27/2017 1207 Last data filed at 10/27/2017 0953 Gross per 24 hour  Intake 3651.75 ml  Output -  Net 3651.75 ml   Filed Weights   10/23/17 1117 10/25/17 0527 10/27/17 0439  Weight: 63.5 kg (140 lb) 74 kg (163 lb 2.3  oz) 73.2 kg (161 lb 6 oz)    Examination:  General exam: Appears calm and comfortable Respiratory system: Clear to auscultation. Respiratory effort normal. Cardiovascular system: S1 & S2 heard, RRR. No murmurs, rubs, gallops or clicks. Gastrointestinal system: Abdomen is nondistended, soft and non-tender. Normal bowel sounds heard. Central nervous system: Alert and oriented. No focal neurological deficits. Extremities: No edema. No calf tenderness Skin: No cyanosis. No rashes Psychiatry: Judgement and insight appear normal. Mood & affect appropriate.     Data Reviewed: I have personally reviewed following labs and imaging studies  CBC: Recent Labs  Lab 10/23/17 1133 10/24/17 0703 10/25/17 0423 10/26/17 0419  WBC 11.9* 8.8 7.3 7.8  NEUTROABS  --   --  5.2 6.0  HGB 8.6* 7.7* 8.1* 7.7*  HCT 31.8* 29.2* 29.8* 27.9*  MCV 75.7* 73.9* 74.9* 74.6*  PLT 295 277 254 510   Basic Metabolic Panel: Recent Labs  Lab 10/23/17 1133 10/24/17 0703 10/25/17 0423 10/26/17 0419  NA 141 137 140 141  K 4.1 3.9 3.9 3.1*  CL 106 106 107 106  CO2 25 23 25 24   GLUCOSE 110* 89 95 120*  BUN 21* 20 15 8   CREATININE 1.01* 1.02* 1.28* 1.17*  CALCIUM 8.7* 8.5* 8.6* 8.3*  MG  --   --  2.2 2.1  PHOS  --   --  4.4 3.6   GFR: Estimated Creatinine Clearance: 38.9 mL/min (A) (by C-G formula based on SCr of 1.17 mg/dL (H)). Liver Function Tests: Recent Labs  Lab 10/23/17 1133 10/25/17 0423 10/26/17 0419  AST 19 16 15   ALT 16 14 13*  ALKPHOS 47 43 40  BILITOT 0.6 1.0 0.7  PROT 6.5 5.9* 5.3*  ALBUMIN 3.7 3.2* 3.0*   Recent Labs  Lab 10/23/17 1508  LIPASE 23   No results for input(s): AMMONIA in the last 168 hours. Coagulation Profile: Recent Labs  Lab 10/23/17 1508 10/24/17 0703  INR 1.04 1.09   Cardiac Enzymes: No results for input(s): CKTOTAL, CKMB, CKMBINDEX, TROPONINI in the last 168 hours. BNP (last 3 results) No results for input(s): PROBNP in the last 8760  hours. HbA1C: No results for input(s): HGBA1C in the last 72 hours. CBG: No results for input(s): GLUCAP in the last 168 hours. Lipid Profile: No results for input(s): CHOL, HDL, LDLCALC, TRIG, CHOLHDL, LDLDIRECT in the last 72 hours. Thyroid Function Tests: No results for input(s): TSH, T4TOTAL, FREET4, T3FREE, THYROIDAB in the last 72 hours. Anemia Panel: No results for input(s): VITAMINB12, FOLATE, FERRITIN, TIBC, IRON, RETICCTPCT in the last 72 hours. Sepsis Labs: Recent Labs  Lab 10/23/17 1547 10/23/17 1909  LATICACIDVEN 1.42 1.82    Recent Results (from the past 240 hour(s))  Urine culture     Status: Abnormal   Collection Time: 10/23/17  3:17 PM  Result Value Ref Range Status   Specimen Description URINE, CLEAN CATCH  Final   Special Requests NONE  Final   Culture MULTIPLE SPECIES PRESENT, SUGGEST RECOLLECTION (A)  Final   Report Status 10/24/2017 FINAL  Final  Blood culture (routine x 2)  Status: None (Preliminary result)   Collection Time: 10/23/17  5:40 PM  Result Value Ref Range Status   Specimen Description BLOOD BLOOD RIGHT WRIST  Final   Special Requests   Final    IN BOTH AEROBIC AND ANAEROBIC BOTTLES Blood Culture adequate volume   Culture NO GROWTH 3 DAYS  Final   Report Status PENDING  Incomplete  Blood culture (routine x 2)     Status: None (Preliminary result)   Collection Time: 10/23/17  5:45 PM  Result Value Ref Range Status   Specimen Description BLOOD RIGHT HAND  Final   Special Requests   Final    IN BOTH AEROBIC AND ANAEROBIC BOTTLES Blood Culture adequate volume   Culture NO GROWTH 3 DAYS  Final   Report Status PENDING  Incomplete  Surgical PCR screen     Status: None   Collection Time: 10/24/17  6:52 AM  Result Value Ref Range Status   MRSA, PCR NEGATIVE NEGATIVE Final   Staphylococcus aureus NEGATIVE NEGATIVE Final    Comment: (NOTE) The Xpert SA Assay (FDA approved for NASAL specimens in patients 9 years of age and older), is one  component of a comprehensive surveillance program. It is not intended to diagnose infection nor to guide or monitor treatment.          Radiology Studies: No results found.      Scheduled Meds: . cloNIDine  0.1 mg Oral BID  . fentaNYL      . ferrous sulfate  325 mg Oral BID WC  . FLUoxetine  20 mg Oral Daily  . [START ON 10/28/2017] heparin injection (subcutaneous)  5,000 Units Subcutaneous Q8H  . isosorbide mononitrate  30 mg Oral Daily  . lactobacillus acidophilus  2 tablet Oral TID  . lidocaine      . mouth rinse  15 mL Mouth Rinse BID  . metoprolol tartrate  50 mg Oral BID  . midazolam      . minoxidil  2.5 mg Oral Daily  . mometasone-formoterol  2 puff Inhalation BID  . pantoprazole  40 mg Oral Daily  . rosuvastatin  40 mg Oral Daily  . sodium chloride flush  3 mL Intravenous Q12H  . verapamil  240 mg Oral QPM   Continuous Infusions: . sodium chloride    . dextrose 5% lactated ringers 75 mL/hr at 10/27/17 0026  . piperacillin-tazobactam (ZOSYN)  IV 3.375 g (10/27/17 0946)     LOS: 4 days     Cordelia Poche, MD [facility] 10/27/2017, 12:07 PM Pager: [phone]  If 7PM-7AM, please contact night-coverage [email or web address] Password [email or web address] 10/27/2017, 12:07 PM

## 2017-10-27 NOTE — Sedation Documentation (Signed)
Pt complains of pain at procedure site

## 2017-10-28 DIAGNOSIS — N824 Other female intestinal-genital tract fistulae: Secondary | ICD-10-CM

## 2017-10-28 LAB — CULTURE, BLOOD (ROUTINE X 2)
CULTURE: NO GROWTH
Culture: NO GROWTH

## 2017-10-28 MED ORDER — CLOPIDOGREL BISULFATE 75 MG PO TABS: 75.0000 mg | ORAL_TABLET | Freq: Every day | ORAL | Status: DC

## 2017-10-28 MED ORDER — METRONIDAZOLE 500 MG PO TABS: 500.0000 mg | ORAL_TABLET | Freq: Three times a day (TID) | ORAL | 0 refills | Status: AC

## 2017-10-28 MED ORDER — METRONIDAZOLE 500 MG PO TABS
500.0000 mg | ORAL_TABLET | Freq: Three times a day (TID) | ORAL | Status: DC
  Administered 2017-10-28: 500 mg via ORAL
  Filled 2017-10-28: qty 1

## 2017-10-28 MED ORDER — CEFUROXIME AXETIL 500 MG PO TABS: 500.0000 mg | ORAL_TABLET | Freq: Two times a day (BID) | ORAL | 0 refills | Status: AC

## 2017-10-28 MED ORDER — TRAMADOL HCL 50 MG PO TABS: 50.0000 mg | ORAL_TABLET | Freq: Two times a day (BID) | ORAL | 0 refills | Status: DC | PRN

## 2017-10-28 MED ORDER — CEFUROXIME AXETIL 500 MG PO TABS: 500.0000 mg | ORAL_TABLET | Freq: Two times a day (BID) | ORAL | Status: DC

## 2017-10-28 MED ORDER — ZOLPIDEM TARTRATE 10 MG PO TABS: 5.0000 mg | ORAL_TABLET | Freq: Every evening | ORAL | Status: AC | PRN

## 2017-10-28 MED ORDER — BACID PO TABS: 2.0000 | ORAL_TABLET | Freq: Three times a day (TID) | ORAL | 0 refills | Status: DC

## 2017-10-28 MED ORDER — CLOPIDOGREL BISULFATE 75 MG PO TABS
75.0000 mg | ORAL_TABLET | Freq: Every day | ORAL | Status: DC
  Administered 2017-10-28: 75 mg via ORAL
  Filled 2017-10-28: qty 1

## 2017-10-28 NOTE — Progress Notes (Signed)
Discharge instructions reviewed with patient and her son,  Pt to scheduled f/u appointments and aware of this.   Pt will pick up her prescriptions at her [facility]. Pt's PIV was removed. Pt was given her discharge instructions after signing them. Pt left via w/c with her son - he was driving her home.

## 2017-10-28 NOTE — Progress Notes (Signed)
Patient ID: Joanne [name], female   DOB: 06/08/1941, 77 y.o.   MRN: [id number]  [facility] Progress Note     Subjective: CC-  Sitting up eating breakfast. States that she is sore from procedure yesterday, but otherwise denies abdominal pain. Tolerating diet. Denies n/v. Having bowel function. Diarrhea resolved. Denies dysuria. States that urine is clear/yellow. VSS.  Objective: Vital signs in last 24 hours: Temp:  [97.7 F (36.5 C)-98.7 F (37.1 C)] 98.7 F (37.1 C) (01/02 0540) Pulse Rate:  [64-78] 67 (01/02 0540) Resp:  [12-21] 18 (01/02 0540) BP: (109-176)/(58-91) 143/68 (01/02 0540) SpO2:  [96 %-100 %] 97 % (01/02 0821) Last BM Date: 10/27/17  Intake/Output from previous day: 01/01 0701 - 01/02 0700 In: 086 [P.O.:574; I.V.:3; IV Piggyback:100] Out: -  Intake/Output this shift: No intake/output data recorded.  PE: Gen:  Alert, NAD, pleasant HEENT: EOM's intact, pupils equal and round Pulm:  effort normal Abd: Soft, ND, minimal TTP LLQ without rebound or guarding, +BS, no HSM, no hernia Ext:  Calves soft and nontender Psych: A&Ox3  Skin: no rashes noted, warm and dry  Lab Results:  Recent Labs    10/26/17 0419  WBC 7.8  HGB 7.7*  HCT 27.9*  PLT 217   BMET Recent Labs    10/26/17 0419 10/27/17 1107  NA 141 138  K 3.1* 3.6  CL 106 111  CO2 24 19*  GLUCOSE 120* 156*  BUN 8 <5*  CREATININE 1.17* 1.15*  CALCIUM 8.3* 8.4*   PT/INR No results for input(s): LABPROT, INR in the last 72 hours. CMP     Component Value Date/Time   NA 138 10/27/2017 1107   NA 140 11/06/2016 1549   K 3.6 10/27/2017 1107   CL 111 10/27/2017 1107   CO2 19 (L) 10/27/2017 1107   GLUCOSE 156 (H) 10/27/2017 1107   BUN <5 (L) 10/27/2017 1107   BUN 30 (H) 11/06/2016 1549   CREATININE 1.15 (H) 10/27/2017 1107   CALCIUM 8.4 (L) 10/27/2017 1107   PROT 5.3 (L) 10/26/2017 0419   ALBUMIN 3.0 (L) 10/26/2017 0419   AST 15 10/26/2017 0419   ALT 13 (L) 10/26/2017 0419   ALKPHOS 40 10/26/2017 0419   BILITOT 0.7 10/26/2017 0419   GFRNONAA 45 (L) 10/27/2017 1107   GFRAA 53 (L) 10/27/2017 1107   Lipase     Component Value Date/Time   LIPASE 23 10/23/2017 1508       Studies/Results: Ct Aspiration  Result Date: 10/27/2017 INDICATION: Pelvic fluid collection.  Diverticulitis. EXAM: CT-GUIDED FLUID ASPIRATION MEDICATIONS: The patient is currently admitted to the hospital and receiving intravenous antibiotics. The antibiotics were administered within an appropriate time frame prior to the initiation of the procedure. ANESTHESIA/SEDATION: Fentanyl 125 mcg IV; Versed 2.5 mg IV Moderate Sedation Time:  10 minutes The patient was continuously monitored during the procedure by the interventional radiology nurse under my direct supervision. COMPLICATIONS: None immediate. PROCEDURE: Informed written consent was obtained from the patient after a thorough discussion of the procedural risks, benefits and alternatives. All questions were addressed. Maximal Sterile Barrier Technique was utilized including caps, mask, sterile gowns, sterile gloves, sterile drape, hand hygiene and skin antiseptic. A timeout was performed prior to the initiation of the procedure. In the proper position, the left gluteal region was prepped and draped in a sterile fashion. 1% lidocaine was utilized for local anesthesia. Under CT guidance, an 18 gauge needle was advanced into the left pelvic fluid collection via trans gluteal approach.  30 cc clear yellow fluid was aspirated. FINDINGS: CT imaging documents needle placement in the pelvic fluid collection. Post aspiration imaging demonstrates a marked reduction in the size of the fluid collection. IMPRESSION: Successful CT-guided left pelvic fluid aspiration yielding clear yellow fluid. Drain was not placed. Electronically Signed   By: Marybelle Killings M.D.   On: 10/27/2017 12:45    Anti-infectives: Anti-infectives (From admission, onward)   Start     Dose/Rate  Route Frequency Ordered Stop   10/24/17 0100  piperacillin-tazobactam (ZOSYN) IVPB 3.375 g     3.375 g 12.5 mL/hr over 240 Minutes Intravenous Every 8 hours 10/23/17 2041     10/23/17 1745  piperacillin-tazobactam (ZOSYN) IVPB 3.375 g     3.375 g 100 mL/hr over 30 Minutes Intravenous  Once 10/23/17 1739 10/23/17 1927       Assessment/Plan COPD/history of tobacco use CAD history of CABG Chronic diastolic congestive heart failure (Echo 05/2015 EF 40-34%, grade 2 diastolic dysfunction/trivial AR, Mild MR,Mod TR) Atrial fibrillation-Plavix Mild renal insuffiencey - improving, creatinine down to 1.15 10/28/16 Hypertension Moderate left renal artery stenosis History of scoliosis Arthritis Breast cancer  Diverticulitis/possible colovaginal fistula - CT 12/28: Sigmoid diverticulitis with adjacent fluid collection possible colovaginal fistula - s/p aspiration of fluid collection 1/1 by IR (no drain left), 30cc clear yellow fluid out - culture pending - tolerating diet, pain controlled  FEN: heart healthy diet ID: Zosyn 10/23/17>>day#6 DVT: heparin Foley: Follow-up: 1 month Dr. Annye English  Plan: Patient ready for discharge from surgical standpoint. Ok to restart plavix. Recommend continuing antibiotics x2 weeks as outpatient. She may follow up with Dr. Dema Severin in about 1 month.   LOS: 5 days    Wellington Hampshire , [facility] 10/28/2017, 8:34 AM Pager: [phone] Consults: [phone] Mon-Fri 7:00 am-4:30 pm Sat-Sun 7:00 am-11:30 am

## 2017-10-28 NOTE — Discharge Instructions (Signed)
Joanne [name],  You were admitted and found to have diverticulitis, an abscess and a fistula. Your diverticulitis and abscess are being treated with antibiotics. Your abscess was also drained. Your fistula will have to be managed by general surgery at a later time. You will go home with two weeks of antibiotics. Please follow-up with your primary care physician for a hospital follow-up and also for lab work. Please follow-up with general surgery for management of your fistula.

## 2017-10-28 NOTE — Discharge Summary (Signed)
Physician Discharge Summary  JALEENA VIVIANI BHA:[id number] DOB: May 03, 1941 DOA: 10/23/2017  PCP: Leanna Battles, MD  Admit date: 10/23/2017 Discharge date: 10/28/2017  Admitted From: Home Disposition: Home  Recommendations for Outpatient Follow-up:  1. Follow up with PCP in 1 week 2. Follow up with General surgery in 4 weeks 3. Please obtain BMP/CBC in one week 4. Please follow up on the following pending results: Blood culture final result  Home Health: Physical therapy Equipment/Devices: None  Discharge Condition: Stable CODE STATUS: Full code Diet recommendation: Heart healthy   Brief/Interim Summary:  Admission HPI written by Phillips Grout, MD   Chief Complaint:  Abdominal pain, discharge and feces coming through vagina  HPI: Joanne [name] is a 77 y.o. female with medical history significant of CHF, CAD/CABG, Afib, copd, not on home supplemental oxygen, GERD, previous diverticulitis about 3 months resolved clinically with oral abx, comes in with over a month of lower abdominal pain which has progressed to discharge and fecal material from her vagina for over 3 weeks now.  Pt has seen her PCP during this time but did not discuss her acute issues with him because she was concerned that she has already had too much surgery.  Denies any fevers, but having chills.  No n/v/d.  Pt found to have diverticulitis with absess and air in the vagina likely a fistula.  General surgery has been called and will evaluate the pt.  Pt referred for admission for iv abx and surgical evaluation.    Hospital course:  Acute Sigmoid Diverticulitis of Large Intestine with Complication including Abscess and Colovaginal Fistula Seen on CT scan. IR performed aspiration on 1/1 without drain placement secondary to minimal fluid. Aspirate culture without organisms. Plavix held secondary to procedure but restarted prior to discharge. Patient was empirically treated with Zosyn. Discussed with infectious disease  prior to discharge, and patient will continue 2 week course on Ceftin and Metronidazole.  Essential Hypertension Continued Clonidine 0.1 mg po BID, Minoxidil 2.5 mg Daily Verapamil 240 mg po qHS, Metoprolol 50 mg po BID  COPD (chronic obstructive pulmonary disease)  Stable. Albuterol 2.5 mg Neb 4 times daily and q6hprn, Atrovent 0.5 mg Neb BID  Hyperlipidemia Rosuvastatin 40 mg po Daily   Chronic diastolic CHF (congestive heart failure) (HCC) Stable. Euvolemic.   Renal Insuffiencey in the setting ofAtherosclerosis of renal artery and Renal Artery Stenosis Stable. Follows up with Vascular Surgery Dr. Donnetta Hutching as an outpatient.  Acute on Chronic Anemia Stable. Iron panel suggests chronic disease but possible component of iron deficiency. Fecal occult test not obtained. Recheck CBC as an outpatient.   Hx of CAD s/p CABG Held Plavix for CT Guided Drain Placement. Continued Isosorbide mononitrate and Crestor. Resumed Plavix prior to discharge.  Hx of Post-Operative A Fib Last occurred in 2011. Not on Anticoagulation. Continued Metoprolol 50 mg po BID.  Hx of Breast Cancer s/p Lumpectomy  Outpatient Follow Up   Discharge Diagnoses:  Principal Problem:   Diverticulitis of large intestine with complication Active Problems:   Essential hypertension   COPD (chronic obstructive pulmonary disease) (HCC)   Renal artery stenosis (HCC)   Hyperlipidemia   Chronic diastolic CHF (congestive heart failure) (HCC)   Atherosclerosis of renal artery (HCC)   Chronic anemia   Colovaginal fistula    Discharge Instructions  Discharge Instructions    Call MD for:  redness, tenderness, or signs of infection (pain, swelling, redness, odor or green/yellow discharge around incision site)   Complete by:  As  directed    Call MD for:  severe uncontrolled pain   Complete by:  As directed    Call MD for:  temperature >100.4   Complete by:  As directed    Diet - low sodium heart healthy    Complete by:  As directed    Increase activity slowly   Complete by:  As directed      Allergies as of 10/28/2017      Reactions   Iohexol Anaphylaxis, Shortness Of Breath, Swelling    Code: SOB, Desc: ct @ ser w/ throat swelling & sob, ok w/ 13 hr prep//a.c., Onset Date: [id number]   Penicillins Anaphylaxis   Tolerated Zosyn 09/2017.  TDD. Has patient had a PCN reaction causing immediate rash, facial/tongue/throat swelling, SOB or lightheadedness with hypotension: Yes Has patient had a PCN reaction causing severe rash involving mucus membranes or skin necrosis: No Has patient had a PCN reaction that required hospitalization: No Has patient had a PCN reaction occurring within the last 10 years: No If all of the above answers are "NO", then may proceed with Cephalosporin use.      Medication List    STOP taking these medications   furosemide 40 MG tablet Commonly known as:  LASIX   HYDROcodone-acetaminophen 10-325 MG tablet Commonly known as:  NORCO   predniSONE 20 MG tablet Commonly known as:  DELTASONE     TAKE these medications   albuterol (2.5 MG/3ML) 0.083% nebulizer solution Commonly known as:  PROVENTIL Take 2.5 mg by nebulization 4 (four) times daily.   albuterol 108 (90 Base) MCG/ACT inhaler Commonly known as:  PROVENTIL HFA;VENTOLIN HFA Inhale 1 puff into the lungs every 6 (six) hours as needed for wheezing or shortness of breath.   ALPRAZolam 0.25 MG tablet Commonly known as:  XANAX Take 0.25 mg by mouth daily as needed for anxiety.   budesonide-formoterol 160-4.5 MCG/ACT inhaler Commonly known as:  SYMBICORT Inhale 2 puffs into the lungs 2 (two) times daily.   cefUROXime 500 MG tablet Commonly known as:  CEFTIN Take 1 tablet (500 mg total) by mouth 2 (two) times daily with a meal for 9 days.   cloNIDine 0.1 MG tablet Commonly known as:  CATAPRES Take 1 tablet (0.1 mg total) by mouth 2 (two) times daily.   clopidogrel 75 MG tablet Commonly known as:   PLAVIX Take 1 tablet (75 mg total) by mouth daily with breakfast.   CRESTOR 40 MG tablet Generic drug:  rosuvastatin TAKE 1 TABLET BY MOUTH DAILY What changed:    how much to take  how to take this  when to take this   ferrous sulfate 325 (65 FE) MG tablet Take 1 tablet (325 mg total) by mouth 2 (two) times daily with a meal. What changed:  when to take this   FLUoxetine 20 MG capsule Commonly known as:  PROZAC Take 20 mg by mouth daily.   ibuprofen 200 MG tablet Commonly known as:  ADVIL,MOTRIN Take 200-400 mg by mouth every 6 (six) hours as needed (for pain or headaches).   ipratropium 0.02 % nebulizer solution Commonly known as:  ATROVENT Take 0.5 mg by nebulization 2 (two) times daily.   isosorbide mononitrate 30 MG 24 hr tablet Commonly known as:  IMDUR Take 1 tablet (30 mg total) by mouth daily.   lactobacillus acidophilus Tabs tablet Take 2 tablets by mouth 3 (three) times daily.   metoprolol tartrate 50 MG tablet Commonly known as:  LOPRESSOR Take 1 tablet (50  mg total) by mouth 2 (two) times daily.   metroNIDAZOLE 500 MG tablet Commonly known as:  FLAGYL Take 1 tablet (500 mg total) by mouth every 8 (eight) hours for 9 days.   minoxidil 2.5 MG tablet Commonly known as:  LONITEN Take 2.5 mg by mouth daily.   nitroGLYCERIN 0.4 MG SL tablet Commonly known as:  NITROSTAT Place 1 tablet (0.4 mg total) under the tongue every 5 (five) minutes as needed for chest pain. What changed:  when to take this   pantoprazole 40 MG tablet Commonly known as:  PROTONIX TAKE 1 TABLET EVERY DAY What changed:    how much to take  how to take this  when to take this   traMADol 50 MG tablet Commonly known as:  ULTRAM Take 1 tablet (50 mg total) by mouth every 12 (twelve) hours as needed for severe pain.   verapamil 240 MG (CO) 24 hr tablet Commonly known as:  COVERA HS Take 240 mg by mouth every evening.   zolpidem 10 MG tablet Commonly known as:   AMBIEN Take 0.5 tablets (5 mg total) by mouth at bedtime as needed for sleep. What changed:  how much to take      Follow-up Information    Ileana Roup, MD. Schedule an appointment as soon as possible for a visit in 1 month(s).   Specialty:  General Surgery Contact information: [place] [address] [phone]        Leanna Battles, MD. Schedule an appointment as soon as possible for a visit in 1 week(s).   Specialty:  Internal Medicine Contact information: [place] [address] [phone]          Allergies  Allergen Reactions  . Iohexol Anaphylaxis, Shortness Of Breath and Swelling     Code: SOB, Desc: ct @ ser w/ throat swelling & sob, ok w/ 13 hr prep//a.c., Onset Date: [id number]   . Penicillins Anaphylaxis    Tolerated Zosyn 09/2017.  TDD.  Has patient had a PCN reaction causing immediate rash, facial/tongue/throat swelling, SOB or lightheadedness with hypotension: Yes Has patient had a PCN reaction causing severe rash involving mucus membranes or skin necrosis: No Has patient had a PCN reaction that required hospitalization: No Has patient had a PCN reaction occurring within the last 10 years: No If all of the above answers are "NO", then may proceed with Cephalosporin use.     Consultations:  General surgery   Procedures/Studies: Ct Abdomen Pelvis Wo Contrast  Result Date: 10/23/2017 CLINICAL DATA:  77 year old with vaginal bleeding and black stool for 1 month. History of appendectomy, hysterectomy and cholecystectomy. EXAM: CT ABDOMEN AND PELVIS WITHOUT CONTRAST TECHNIQUE: Multidetector CT imaging of the abdomen and pelvis was performed following the standard protocol without IV contrast. COMPARISON:  Abdominopelvic CT 02/13/2012.  Lumbar MRI 09/21/2016. FINDINGS: Lower chest: 5 mm subpleural right lower lobe nodule on image number 11 (best seen on coronal image 132), not clearly seen before, but unlikely to be  clinically significant. The lung bases are otherwise clear. There are mild emphysematous changes. Dense mitral annular and possible valvular calcifications. There is aortic atherosclerosis and a small left pleural effusion. Hepatobiliary: As evaluated in the noncontrast state, the liver appears stable without focal abnormality. No significant biliary dilatation status post cholecystectomy. Pancreas: Unremarkable. No pancreatic ductal dilatation or surrounding inflammatory changes. Spleen: Normal in size without focal abnormality. Adrenals/Urinary Tract: Both adrenal glands appear normal. Both kidneys appear stable without evidence of  urinary tract calculus or hydronephrosis. There is no perinephric soft tissue stranding. The ureters and bladder appear unremarkable. There is no gas in the bladder lumen. Stomach/Bowel: No enteric contrast was administered. The stomach, small bowel and proximal colon appear normal. There are diverticular changes throughout the distal colon with sigmoid colon wall thickening and mild surrounding inflammation. Superior to the sigmoid colon, there is a tubular shaped fluid collection which measures up to 8.5 cm in length on axial image 58 and 3.0 cm transverse on image 56. There is a poor fat plane between the inflamed sigmoid colon and the vaginal cuff on the left, best seen on the coronal and sagittal images. Vascular/Lymphatic: There are no enlarged abdominal or pelvic lymph nodes. Diffuse aortic and branch vessel atherosclerosis again noted. There is a stable 2.7 cm mid abdominal aortic aneurysm, best seen on coronal image 74. There are chronically displaced intimal calcifications within the aorta. No retroperitoneal hematoma. Reproductive: Hysterectomy. Possible residual ovarian tissue bilaterally, stable. No adnexal mass. As above, there is a poor fat plane between the inflamed sigmoid colon and vaginal cuff on the left. There is some gas within the vagina. Other: There is no  generalized ascites or free intraperitoneal air. No other extraluminal fluid collections are seen. Musculoskeletal: No acute or significant osseous findings. Degenerative changes in the spine associated with a thoracolumbar scoliosis. IMPRESSION: 1. Sigmoid diverticulitis with adjacent tubular fluid collection suspicious for contained perforation and pelvic abscess. 2. Poor fat plane between the inflamed sigmoid colon and the vaginal cuff on the left. There is gas in the vagina. 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 Surgical consultation recommended. Follow-up CT with oral or rectal contrast, and intravenous contrast if possible, may be helpful to better evaluate the pelvic findings. 3. Small left pleural effusion. 4.  Aortic Atherosclerosis (ICD10-I70.0). Electronically Signed   By: Richardean Sale M.D.   On: 10/23/2017 17:13   Dg Chest 2 View  Result Date: 10/23/2017 CLINICAL DATA:  Increasing shortness of breath EXAM: CHEST  2 VIEW COMPARISON:  10/28/2016,  06/01/2005 FINDINGS: Post sternotomy changes. Postsurgical changes in the left axillary region. Hyperinflation. Diffuse left greater than right interstitial opacity, slightly more prominent compared to previous. Vague focal opacity in the left upper lung. Cardiomegaly with aortic atherosclerosis. No pneumothorax. Multiple compression deformities of the spine IMPRESSION: 1. Hyperinflation. Mild diffuse left greater than right interstitial opacities somewhat more prominent compared to prior, findings could represent mild acute edema or inflammatory process on chronic change. A vague focal opacities seen in the left upper lung and may represent a small focus of pulmonary infiltrate. Short interval radiographic follow-up recommended 2. Cardiomegaly Electronically Signed   By: Donavan Foil M.D.   On: 10/23/2017 16:08   Ct Aspiration  Result Date: 10/27/2017 INDICATION: Pelvic fluid collection.   Diverticulitis. EXAM: CT-GUIDED FLUID ASPIRATION MEDICATIONS: The patient is currently admitted to the hospital and receiving intravenous antibiotics. The antibiotics were administered within an appropriate time frame prior to the initiation of the procedure. ANESTHESIA/SEDATION: Fentanyl 125 mcg IV; Versed 2.5 mg IV Moderate Sedation Time:  10 minutes The patient was continuously monitored during the procedure by the interventional radiology nurse under my direct supervision. COMPLICATIONS: None immediate. PROCEDURE: Informed written consent was obtained from the patient after a thorough discussion of the procedural risks, benefits and alternatives. All questions were addressed. Maximal Sterile Barrier Technique was utilized including caps, mask, sterile gowns, sterile gloves, sterile drape, hand hygiene and skin antiseptic. A  timeout was performed prior to the initiation of the procedure. In the proper position, the left gluteal region was prepped and draped in a sterile fashion. 1% lidocaine was utilized for local anesthesia. Under CT guidance, an 18 gauge needle was advanced into the left pelvic fluid collection via trans gluteal approach. 30 cc clear yellow fluid was aspirated. FINDINGS: CT imaging documents needle placement in the pelvic fluid collection. Post aspiration imaging demonstrates a marked reduction in the size of the fluid collection. IMPRESSION: Successful CT-guided left pelvic fluid aspiration yielding clear yellow fluid. Drain was not placed. Electronically Signed   By: Marybelle Killings M.D.   On: 10/27/2017 12:45      Subjective: Diarrhea improved. No abdominal pain. Afebrile.  Discharge Exam: Vitals:   10/28/17 0821 10/28/17 0952  BP:  (!) 157/64  Pulse:  69  Resp:    Temp:    SpO2: 97%    Vitals:   10/27/17 2119 10/28/17 0540 10/28/17 0821 10/28/17 0952  BP: 119/61 (!) 143/68  (!) 157/64  Pulse: 64 67  69  Resp: 18 18    Temp: 98.7 F (37.1 C) 98.7 F (37.1 C)    TempSrc:  Oral Oral    SpO2: 96% 99% 97%   Weight:      Height:        General: Pt is alert, awake, not in acute distress Cardiovascular: RRR, S1/S2 +, no rubs, no gallops Respiratory: CTA bilaterally, no wheezing, no rhonchi Abdominal: Soft, NT, ND, bowel sounds + Extremities: no edema, no cyanosis    The results of significant diagnostics from this hospitalization (including imaging, microbiology, ancillary and laboratory) are listed below for reference.     Microbiology: Recent Results (from the past 240 hour(s))  Urine culture     Status: Abnormal   Collection Time: 10/23/17  3:17 PM  Result Value Ref Range Status   Specimen Description URINE, CLEAN CATCH  Final   Special Requests NONE  Final   Culture MULTIPLE SPECIES PRESENT, SUGGEST RECOLLECTION (A)  Final   Report Status 10/24/2017 FINAL  Final  Blood culture (routine x 2)     Status: None (Preliminary result)   Collection Time: 10/23/17  5:40 PM  Result Value Ref Range Status   Specimen Description BLOOD BLOOD RIGHT WRIST  Final   Special Requests   Final    IN BOTH AEROBIC AND ANAEROBIC BOTTLES Blood Culture adequate volume   Culture NO GROWTH 4 DAYS  Final   Report Status PENDING  Incomplete  Blood culture (routine x 2)     Status: None (Preliminary result)   Collection Time: 10/23/17  5:45 PM  Result Value Ref Range Status   Specimen Description BLOOD RIGHT HAND  Final   Special Requests   Final    IN BOTH AEROBIC AND ANAEROBIC BOTTLES Blood Culture adequate volume   Culture NO GROWTH 4 DAYS  Final   Report Status PENDING  Incomplete  Surgical PCR screen     Status: None   Collection Time: 10/24/17  6:52 AM  Result Value Ref Range Status   MRSA, PCR NEGATIVE NEGATIVE Final   Staphylococcus aureus NEGATIVE NEGATIVE Final    Comment: (NOTE) The Xpert SA Assay (FDA approved for NASAL specimens in patients 62 years of age and older), is one component of a comprehensive surveillance program. It is not intended to  diagnose infection nor to guide or monitor treatment.   Aerobic/Anaerobic Culture (surgical/deep wound)     Status: None (Preliminary result)  Collection Time: 10/27/17 10:15 AM  Result Value Ref Range Status   Specimen Description PELVIS  Final   Special Requests NONE  Final   Gram Stain   Final    RARE WBC PRESENT, PREDOMINANTLY MONONUCLEAR NO ORGANISMS SEEN    Culture PENDING  Incomplete   Report Status PENDING  Incomplete  C difficile quick scan w PCR reflex     Status: None   Collection Time: 10/27/17  3:12 PM  Result Value Ref Range Status   C Diff antigen NEGATIVE NEGATIVE Final   C Diff toxin NEGATIVE NEGATIVE Final   C Diff interpretation No C. difficile detected.  Final     Labs: BNP (last 3 results) Recent Labs    10/23/17 1512  BNP 683.4*   Basic Metabolic Panel: Recent Labs  Lab 10/23/17 1133 10/24/17 0703 10/25/17 0423 10/26/17 0419 10/27/17 1107  NA 141 137 140 141 138  K 4.1 3.9 3.9 3.1* 3.6  CL 106 106 107 106 111  CO2 25 23 25 24  19*  GLUCOSE 110* 89 95 120* 156*  BUN 21* 20 15 8  <5*  CREATININE 1.01* 1.02* 1.28* 1.17* 1.15*  CALCIUM 8.7* 8.5* 8.6* 8.3* 8.4*  MG  --   --  2.2 2.1  --   PHOS  --   --  4.4 3.6  --    Liver Function Tests: Recent Labs  Lab 10/23/17 1133 10/25/17 0423 10/26/17 0419  AST 19 16 15   ALT 16 14 13*  ALKPHOS 47 43 40  BILITOT 0.6 1.0 0.7  PROT 6.5 5.9* 5.3*  ALBUMIN 3.7 3.2* 3.0*   Recent Labs  Lab 10/23/17 1508  LIPASE 23   No results for input(s): AMMONIA in the last 168 hours. CBC: Recent Labs  Lab 10/23/17 1133 10/24/17 0703 10/25/17 0423 10/26/17 0419  WBC 11.9* 8.8 7.3 7.8  NEUTROABS  --   --  5.2 6.0  HGB 8.6* 7.7* 8.1* 7.7*  HCT 31.8* 29.2* 29.8* 27.9*  MCV 75.7* 73.9* 74.9* 74.6*  PLT 295 277 254 217   Cardiac Enzymes: No results for input(s): CKTOTAL, CKMB, CKMBINDEX, TROPONINI in the last 168 hours. BNP: Invalid input(s): POCBNP CBG: No results for input(s): GLUCAP in the last  168 hours. D-Dimer No results for input(s): DDIMER in the last 72 hours. Hgb A1c No results for input(s): HGBA1C in the last 72 hours. Lipid Profile No results for input(s): CHOL, HDL, LDLCALC, TRIG, CHOLHDL, LDLDIRECT in the last 72 hours. Thyroid function studies No results for input(s): TSH, T4TOTAL, T3FREE, THYROIDAB in the last 72 hours.  Invalid input(s): FREET3 Anemia work up No results for input(s): VITAMINB12, FOLATE, FERRITIN, TIBC, IRON, RETICCTPCT in the last 72 hours. Urinalysis    Component Value Date/Time   COLORURINE YELLOW 10/23/2017 1517   APPEARANCEUR HAZY (A) 10/23/2017 1517   LABSPEC 1.021 10/23/2017 1517   PHURINE 6.0 10/23/2017 1517   GLUCOSEU NEGATIVE 10/23/2017 1517   HGBUR NEGATIVE 10/23/2017 1517   BILIRUBINUR NEGATIVE 10/23/2017 1517   KETONESUR NEGATIVE 10/23/2017 1517   PROTEINUR 30 (A) 10/23/2017 1517   UROBILINOGEN 1.0 01/21/2010 1600   NITRITE NEGATIVE 10/23/2017 1517   LEUKOCYTESUR LARGE (A) 10/23/2017 1517   Sepsis Labs Invalid input(s): PROCALCITONIN,  WBC,  LACTICIDVEN Microbiology Recent Results (from the past 240 hour(s))  Urine culture     Status: Abnormal   Collection Time: 10/23/17  3:17 PM  Result Value Ref Range Status   Specimen Description URINE, CLEAN CATCH  Final   Special  Requests NONE  Final   Culture MULTIPLE SPECIES PRESENT, SUGGEST RECOLLECTION (A)  Final   Report Status 10/24/2017 FINAL  Final  Blood culture (routine x 2)     Status: None (Preliminary result)   Collection Time: 10/23/17  5:40 PM  Result Value Ref Range Status   Specimen Description BLOOD BLOOD RIGHT WRIST  Final   Special Requests   Final    IN BOTH AEROBIC AND ANAEROBIC BOTTLES Blood Culture adequate volume   Culture NO GROWTH 4 DAYS  Final   Report Status PENDING  Incomplete  Blood culture (routine x 2)     Status: None (Preliminary result)   Collection Time: 10/23/17  5:45 PM  Result Value Ref Range Status   Specimen Description BLOOD RIGHT  HAND  Final   Special Requests   Final    IN BOTH AEROBIC AND ANAEROBIC BOTTLES Blood Culture adequate volume   Culture NO GROWTH 4 DAYS  Final   Report Status PENDING  Incomplete  Surgical PCR screen     Status: None   Collection Time: 10/24/17  6:52 AM  Result Value Ref Range Status   MRSA, PCR NEGATIVE NEGATIVE Final   Staphylococcus aureus NEGATIVE NEGATIVE Final    Comment: (NOTE) The Xpert SA Assay (FDA approved for NASAL specimens in patients 4 years of age and older), is one component of a comprehensive surveillance program. It is not intended to diagnose infection nor to guide or monitor treatment.   Aerobic/Anaerobic Culture (surgical/deep wound)     Status: None (Preliminary result)   Collection Time: 10/27/17 10:15 AM  Result Value Ref Range Status   Specimen Description PELVIS  Final   Special Requests NONE  Final   Gram Stain   Final    RARE WBC PRESENT, PREDOMINANTLY MONONUCLEAR NO ORGANISMS SEEN    Culture PENDING  Incomplete   Report Status PENDING  Incomplete  C difficile quick scan w PCR reflex     Status: None   Collection Time: 10/27/17  3:12 PM  Result Value Ref Range Status   C Diff antigen NEGATIVE NEGATIVE Final   C Diff toxin NEGATIVE NEGATIVE Final   C Diff interpretation No C. difficile detected.  Final     Time coordinating discharge: Over 30 minutes  SIGNED:   Cordelia Poche, MD [facility] 10/28/2017, 11:15 AM Pager [phone]  If 7PM-7AM, please contact night-coverage [email or web address] Password [email or web address]

## 2017-10-28 NOTE — Care Management Important Message (Signed)
Important Message  Patient Details  Name: Joanne [name] MRN: [id number] Date of Birth: [birth year]   Medicare Important Message Given:  Yes    Orbie Pyo 10/28/2017, 2:19 PM

## 2017-10-29 DIAGNOSIS — R0602 Shortness of breath: Secondary | ICD-10-CM | POA: Diagnosis not present

## 2017-10-29 DIAGNOSIS — I2510 Atherosclerotic heart disease of native coronary artery without angina pectoris: Secondary | ICD-10-CM | POA: Diagnosis not present

## 2017-10-29 DIAGNOSIS — R69 Illness, unspecified: Secondary | ICD-10-CM | POA: Diagnosis not present

## 2017-10-29 DIAGNOSIS — J449 Chronic obstructive pulmonary disease, unspecified: Secondary | ICD-10-CM | POA: Diagnosis not present

## 2017-11-01 LAB — AEROBIC/ANAEROBIC CULTURE W GRAM STAIN (SURGICAL/DEEP WOUND): Culture: NO GROWTH

## 2017-11-01 LAB — AEROBIC/ANAEROBIC CULTURE (SURGICAL/DEEP WOUND)

## 2017-11-14 ENCOUNTER — Telehealth: Payer: Self-pay | Admitting: Surgery

## 2017-11-14 NOTE — Telephone Encounter (Signed)
Mr. Khurana was treated for diverticulitis with a possible colo vaginal fistula.  She was sent home on antibiotics.  She still has some metronidazole left (5 tabs) to take.  She has developed increasing lower abdominal pain over the last day or two.  She is not running a fever.  If her pain continues to worsen, I have encouraged her to come to the ER.  I cannot call in narcotics for her pain.  Either way, she will check with our office on Monday, 1/21.  Alphonsa Overall, MD, [facility] Surgery Pager: [phone] Office phone:  [phone]

## 2017-11-30 DIAGNOSIS — K5792 Diverticulitis of intestine, part unspecified, without perforation or abscess without bleeding: Secondary | ICD-10-CM | POA: Diagnosis not present

## 2017-12-02 ENCOUNTER — Telehealth: Payer: Self-pay

## 2017-12-02 NOTE — Telephone Encounter (Signed)
   Primary Cardiologist:Christopher Angelena Form, MD  Chart reviewed as part of pre-operative protocol coverage. Because of Isel Skufca Tudisco's past medical history and time since last visit, he/she will require a follow-up visit in order to better assess preoperative cardiovascular risk.  Pre-op covering staff: - Please schedule appointment and call patient to inform them. - Please contact requesting surgeon's office via preferred method (i.e, phone, fax) to inform them of need for appointment prior to surgery.  Tami Lin Deliah Strehlow, PA  12/02/2017, 2:49 PM

## 2017-12-02 NOTE — Telephone Encounter (Signed)
   [facility] Pre-operative Risk Assessment    Request for surgical clearance:  1. What type of surgery is being performed? "Colon procedure"  2. When is this surgery scheduled? TBD but ASAP  3. What type of clearance is required (medical clearance vs. Pharmacy clearance to hold med vs. Both)? Both  4. Are there any medications that need to be held prior to surgery and how long? Per request, "We need instructions from you as to if the patient should HOLD medication preoperatively. Please advise." Patient is on Plavix.  5. Practice name and name of physician performing surgery? [facility]// Nadeen Landau, MD  6. What is your office phone and fax number?  1. Phone: [phone] 2. Fax: [phone] Attn: Alean Rinne, RMA   7. Anesthesia type (None, local, MAC, general) ? general

## 2017-12-03 NOTE — Telephone Encounter (Signed)
Patient's phone # listed is not available. Called son and got her new # [phone])  Left message for patient to return call to schedule an appointment

## 2017-12-04 NOTE — Telephone Encounter (Signed)
Follow up   Patient is returning call I n reference to clearance. Please call to discuss.

## 2017-12-04 NOTE — Telephone Encounter (Signed)
Attempted to return call to patient. There was no answer. Left message for patient to call back.

## 2017-12-07 ENCOUNTER — Telehealth: Payer: Self-pay | Admitting: Cardiovascular Disease

## 2017-12-07 ENCOUNTER — Encounter: Payer: Self-pay | Admitting: *Deleted

## 2017-12-07 NOTE — Telephone Encounter (Signed)
Spoke with Jan at [facility] and let her know patient will require an office visit prior to being cleared for surgery and let her know date and time of appt.

## 2017-12-07 NOTE — Telephone Encounter (Signed)
I spoke with pt and scheduled her to see Truitt Merle, NP on 12/08/17 at 10:00

## 2017-12-07 NOTE — Telephone Encounter (Signed)
Pt's son calling to speak to Dr. Angelena Form regarding pt's health

## 2017-12-07 NOTE — Telephone Encounter (Signed)
I returned call to pt's son and told him he was not listed on pt's designated party release so I could not speak with him about her condition.  I told him if he was with pt and she gave permission I could speak with him.  He is not currently with pt. I told him again I was unable to speak with him.

## 2017-12-08 ENCOUNTER — Encounter: Payer: Self-pay | Admitting: Nurse Practitioner

## 2017-12-08 ENCOUNTER — Ambulatory Visit: Payer: Medicare HMO | Admitting: Nurse Practitioner

## 2017-12-08 ENCOUNTER — Other Ambulatory Visit: Payer: Self-pay | Admitting: Nurse Practitioner

## 2017-12-08 VITALS — BP 116/60 | HR 67 | Ht 62.0 in | Wt 158.8 lb

## 2017-12-08 DIAGNOSIS — I10 Essential (primary) hypertension: Secondary | ICD-10-CM | POA: Diagnosis not present

## 2017-12-08 DIAGNOSIS — Z01818 Encounter for other preprocedural examination: Secondary | ICD-10-CM

## 2017-12-08 DIAGNOSIS — I25810 Atherosclerosis of coronary artery bypass graft(s) without angina pectoris: Secondary | ICD-10-CM | POA: Diagnosis not present

## 2017-12-08 NOTE — Patient Instructions (Signed)
We will be checking the following labs today - BMET, CBC, PT & PTT   Medication Instructions:    Continue with your current medicines.     Testing/Procedures To Be Arranged:  Cardiac Cath    Other Special Instructions:   Your provider has recommended a cardiac catherization  You are scheduled for a cardiac catheterization on Friday, February 22nd at 10:30 AM with Dr. Angelena Form or associate.  Please arrive at the [facility] (Main Entrance) at [facility] at [address] [place] Stay on Friday, February 22nd at 8:30AM.    Special note: Every effort is made to have your procedure done on time.   Please understand that emergencies sometimes delay a scheduled   procedure.  No food or drink after midnight on Thursday, February 21st.  You may take your morning medications with a sip of water on the day of your procedure.  Please take a baby aspirin (81 mg) on the morning of your procedure.   Medications to [facility] for a one night stay -- bring personal belongings.  Bring a current list of your medications and current insurance cards.  You MUST have a responsible person to drive you home. Someone MUST be with you the first 24 hours after you arrive home or your discharge will be delayed. Wear clothes that are easy to get on and off and wear slip on shoes.    Coronary Angiogram A coronary angiogram, also called coronary angiography, is an X-ray procedure used to look at the arteries in the heart. In this procedure, a dye (contrast dye) is injected through a long, hollow tube (catheter). The catheter is about the size of a piece of cooked spaghetti and is inserted through your groin, wrist, or arm. The dye is injected into each artery, and X-rays are then taken to show if there is a blockage in the arteries of your heart.  LET [facility] CARE PROVIDER KNOW ABOUT: Any allergies you have, including allergies to shellfish or contrast dye.   All medicines you are taking, including vitamins, herbs, eye drops, creams, and over-the-counter medicines.  Previous problems you or members of your family have had with the use of anesthetics.  Any blood disorders you have.  Previous surgeries you have had. History of kidney problems or failure.  Other medical conditions you have.  RISKS AND COMPLICATIONS  Generally, a coronary angiogram is a safe procedure. However, about 1 person out of 1000 can have problems that may include: Allergic reaction to the dye. Bleeding/bruising from the access site or other locations. Kidney injury, especially in people with impaired kidney function. Stroke (rare). Heart attack (rare). Irregular rhythms (rare) Death (rare)  BEFORE THE PROCEDURE  Do not eat or drink anything after midnight the night before the procedure or as directed by your health care provider.  Ask your health care provider about changing or stopping your regular medicines. This is especially important if you are taking diabetes medicines or blood thinners.  PROCEDURE You may be given a medicine to help you relax (sedative) before the procedure. This medicine is given through an intravenous (IV) access tube that is inserted into one of your veins.  The area where the catheter will be inserted will be washed and shaved. This is usually done in the groin but may be done in the fold of your arm (near your elbow) or in the wrist.  A medicine will be given to numb  the area where the catheter will be inserted (local anesthetic).  The health care provider will insert the catheter into an artery. The catheter will be guided by using a special type of X-ray (fluoroscopy) of the blood vessel being examined.  A special dye will then be injected into the catheter, and X-rays will be taken. The dye will help to show where any narrowing or blockages are located in the heart arteries.    AFTER THE PROCEDURE  If the procedure is done  through the leg, you will be kept in bed lying flat for several hours. You will be instructed to not bend or cross your legs. The insertion site will be checked frequently.  The pulse in your feet or wrist will be checked frequently.  Additional blood tests, X-rays, and an electrocardiogram may be done.         If you need a refill on your cardiac medications before your next appointment, please call your pharmacy.   Call the [place] office at [phone] if you have any questions, problems or concerns.

## 2017-12-08 NOTE — H&P (View-Only) (Signed)
CARDIOLOGY OFFICE NOTE  Date:  12/08/2017    Joanne [name] Date of Birth: [birth year] Medical Record #[id number]  PCP:  Leanna Battles, MD  Cardiologist:  [facility]  Chief Complaint  Patient presents with  . Pre-op Exam    Work in visit - seen for Dr. Angelena Form    History of Present Illness: Joanne [name] is a 77 y.o. female who presents today for a pre op visit. Seen for Dr. Angelena Form. She is a former patient of Dr. Susa Simmonds.   She has a history of CAD s/p CABG in 2585, diastolic dysfunction, RBBB, renal artery stenosis, HTN, HLD, COPD, former tobacco abuse, breast cancer s/p lumpectomy 1997, post-op atrial fibrillation and anemia. Her coronary artery bypass grafting was in February 2011. She had a left internal mammary artery to the LAD, vein graft to OM2, and a vein graft to the RCA. She had cardiac cath December 2015 and a DES was placed in a severe ostial LAD stenosis. Last echo August 2016 with normal LV systolic function, trivial AI, mild MR.  Dr Early follows her renal artery stenosis.  She was last seen in January of 2018 by Lyda Jester, PA for follow up of an elevated blood pressure. She had seen Dr. Angelena Form just prior - very hypertensive - had to have clonidine here in the office. Was doing better on recheck. Did need ok to hold Plavix for a lumbar injection.    Comes in today. Here alone. She makes it very clear that she wishes for no one hear to talk to any of her family members. Apparently now needs a "colon surgery" - specifics not really noted in the pre op note. Tells me that she has a "hole in her colon, a growth on her left ovary and a fistula". Needs a colonoscopy first - she says under general anesthesia.   She has occasional palpitations - a couple of times of a month - feels it up in her throat - uses NTG - this helps and sometimes takes a second - says the spell will last about 25 to 30 minutes. No actual chest pain but has had "tightness" - she thought that  this was from her COPD. She is not active. She tries to walk her dog - this will make her chest tight and make her short of breath. Has to stop and rest and this will go away and she does not take NTG. This has been going on for quite some time. Seems to be about the same and not getting worse - but described as "something sitting on me". Remote smoker.   According to the Revised Cardiac Risk Index (RCRI), her Perioperative Risk of Major Cardiac Event is (%): 0.9  Her Functional Capacity in METs is: 3.63 according to the [facility] Activity Status Index (DASI).  Past Medical History:  Diagnosis Date  . Arthritis    "all over"  . Atrial fibrillation ([place])   . Breast cancer ([place]) 1997   "left"  . Chronic diastolic CHF (congestive heart failure) ([place])    a. 08/2010 Echo: EF 65-70%, Gr 1 DD, Mild MR  . Chronic lower back pain   . COPD (chronic obstructive pulmonary disease) ([place])   . Coronary artery disease    a. 11/2009 CABGx3: LIMA->LAD, VG->RCA, VG->OM2. b. Cath 10/11/2014 EF 50-55%,  patent SVG to OM2, occlueded SVG to OM1, atretic and nonfunctional LIMA to LAD, 90% ostial LAD lesion treated with PTCA/LAD  . GERD (gastroesophageal reflux disease)   .  Heart murmur   . History of blood transfusion 11/2009   "related to OHS"   . Hyperlipidemia   . Hypertension   . Left renal artery stenosis ([place])    a. Moderate L RAS  . Myocardial infarction ([place]) 2011  . Scoliosis of thoracic spine     Past Surgical History:  Procedure Laterality Date  . ABDOMINAL HYSTERECTOMY  1970's  . APPENDECTOMY  1970's  . BREAST BIOPSY Left 1997  . BREAST LUMPECTOMY Left 1997  . CARDIAC CATHETERIZATION  11/2009  . CORONARY ANGIOPLASTY WITH STENT PLACEMENT  10/11/2014   "1"  . CORONARY ARTERY BYPASS GRAFT  12/21/2009   CABGX3  Dr. Prescott Gum  . LAPAROSCOPIC CHOLECYSTECTOMY  2001  . LEFT HEART CATHETERIZATION WITH CORONARY ANGIOGRAM N/A 10/11/2014   Procedure: LEFT HEART CATHETERIZATION WITH CORONARY ANGIOGRAM;   Surgeon: Burnell Blanks, MD;  [facility] CATH LAB;  Service: Cardiovascular;  Laterality: N/A;  . TONSILLECTOMY  1973  . TUBAL LIGATION  1982?     Medications: Current Meds  Medication Sig  . albuterol (PROVENTIL HFA;VENTOLIN HFA) 108 (90 BASE) MCG/ACT inhaler Inhale 1 puff into the lungs every 6 (six) hours as needed for wheezing or shortness of breath.  Marland Kitchen albuterol (PROVENTIL) (2.5 MG/3ML) 0.083% nebulizer solution Take 2.5 mg by nebulization 4 (four) times daily.   Marland Kitchen ALPRAZolam (XANAX) 0.25 MG tablet Take 0.25 mg by mouth daily as needed for anxiety.   . budesonide-formoterol (SYMBICORT) 160-4.5 MCG/ACT inhaler Inhale 2 puffs into the lungs 2 (two) times daily.  . cloNIDine (CATAPRES) 0.1 MG tablet Take 1 tablet (0.1 mg total) by mouth 2 (two) times daily.  . clopidogrel (PLAVIX) 75 MG tablet Take 1 tablet (75 mg total) by mouth daily with breakfast.  . CRESTOR 40 MG tablet TAKE 1 TABLET BY MOUTH DAILY (Patient taking differently: Take 40 mg by mouth once a day)  . ferrous sulfate 325 (65 FE) MG tablet Take 1 tablet (325 mg total) by mouth 2 (two) times daily with a meal. (Patient taking differently: Take 325 mg by mouth daily. )  . FLUoxetine (PROZAC) 20 MG capsule Take 20 mg by mouth daily.  Marland Kitchen ipratropium (ATROVENT) 0.02 % nebulizer solution Take 0.5 mg by nebulization 2 (two) times daily.   . isosorbide mononitrate (IMDUR) 30 MG 24 hr tablet Take 1 tablet (30 mg total) by mouth daily.  Marland Kitchen lactobacillus acidophilus (BACID) TABS tablet Take 2 tablets by mouth 3 (three) times daily.  . metoprolol tartrate (LOPRESSOR) 50 MG tablet Take 1 tablet (50 mg total) by mouth 2 (two) times daily.  . minoxidil (LONITEN) 2.5 MG tablet Take 2.5 mg by mouth daily.  . nitroGLYCERIN (NITROSTAT) 0.4 MG SL tablet Place 1 tablet (0.4 mg total) under the tongue every 5 (five) minutes as needed for chest pain. (Patient taking differently: Place 0.4 mg under the tongue every morning. )  . pantoprazole  (PROTONIX) 40 MG tablet TAKE 1 TABLET EVERY DAY (Patient taking differently: Take 40 mg by mouth once a day)  . traMADol (ULTRAM) 50 MG tablet Take 1 tablet (50 mg total) by mouth every 12 (twelve) hours as needed for severe pain.  . verapamil (COVERA HS) 240 MG (CO) 24 hr tablet Take 240 mg by mouth every evening.   . zolpidem (AMBIEN) 10 MG tablet Take 0.5 tablets (5 mg total) by mouth at bedtime as needed for sleep.     Allergies: Allergies  Allergen Reactions  . Iohexol Anaphylaxis, Shortness Of Breath and  Swelling     Code: SOB, Desc: ct @ ser w/ throat swelling & sob, ok w/ 13 hr prep//a.c., Onset Date: [id number]   . Penicillins Anaphylaxis    Tolerated Zosyn 09/2017.  TDD.  Has patient had a PCN reaction causing immediate rash, facial/tongue/throat swelling, SOB or lightheadedness with hypotension: Yes Has patient had a PCN reaction causing severe rash involving mucus membranes or skin necrosis: No Has patient had a PCN reaction that required hospitalization: No Has patient had a PCN reaction occurring within the last 10 years: No If all of the above answers are "NO", then may proceed with Cephalosporin use.     Social History: The patient  reports that she quit smoking about 7 years ago. Her smoking use included cigarettes. She has a 100.00 pack-year smoking history. she has never used smokeless tobacco. She reports that she does not drink alcohol or use drugs.   Family History: The patient's family history includes COPD in her father; Cancer in her sister; Diabetes in her brother and mother; Heart attack in her brother; Heart disease in her brother and mother; Heart failure in her grandchild and mother; Hyperlipidemia in her brother and mother; Hypertension in her brother and mother.   Review of Systems: Please see the history of present illness.   Otherwise, the review of systems is positive for none.   All other systems are reviewed and negative.   Physical Exam: VS:  BP  116/60 (BP Location: Left Arm, Patient Position: Sitting, Cuff Size: Normal)   Pulse 67   Ht 5\' 2"  (1.575 m)   Wt 158 lb 12.8 oz (72 kg)   BMI 29.04 kg/m  .  BMI Body mass index is 29.04 kg/m.  Wt Readings from Last 3 Encounters:  12/08/17 158 lb 12.8 oz (72 kg)  10/27/17 161 lb 6 oz (73.2 kg)  11/06/16 169 lb (76.7 kg)    General: Pleasant. Well developed, well nourished and in no acute distress.   HEENT: Normal.  Neck: Supple, no JVD, carotid bruits, or masses noted.  Cardiac: Regular rate and rhythm. No murmurs, rubs, or gallops. No edema.  Respiratory:  Lungs are clear to auscultation bilaterally with normal work of breathing.  GI: Soft and nontender.  MS: No deformity or atrophy. Gait and ROM intact.  Skin: Warm and dry. Color is normal.  Neuro:  Strength and sensation are intact and no gross focal deficits noted.  Psych: Alert, appropriate and with normal affect.   LABORATORY DATA:  EKG:  EKG is ordered today. This demonstrates NSR with RBBB - reviewed with Dr. Acie Fredrickson (DOD).  Lab Results  Component Value Date   WBC 7.8 10/26/2017   HGB 7.7 (L) 10/26/2017   HCT 27.9 (L) 10/26/2017   PLT 217 10/26/2017   GLUCOSE 156 (H) 10/27/2017   CHOL 196 05/25/2013   TRIG 209.0 (H) 05/25/2013   HDL 48.30 05/25/2013   LDLDIRECT 124.3 05/25/2013   ALT 13 (L) 10/26/2017   AST 15 10/26/2017   NA 138 10/27/2017   K 3.6 10/27/2017   CL 111 10/27/2017   CREATININE 1.15 (H) 10/27/2017   BUN <5 (L) 10/27/2017   CO2 19 (L) 10/27/2017   TSH  01/21/2010    1.117 (NOTE)  Please note change in reference ranges for ages 64W to 68Y. Test methodology is 3rd generation TSH   INR 1.09 10/24/2017   HGBA1C (H) 12/19/2009    6.2 (NOTE) The ADA recommends the following therapeutic goal for glycemic control related  to Hgb A1c measurement: Goal of therapy: <6.5 Hgb A1c  Reference: American Diabetes Association: Clinical Practice Recommendations 2010, Diabetes Care, 2010, 33: (Suppl  1).      BNP (last 3 results) Recent Labs    10/23/17 1512  BNP 622.9*    ProBNP (last 3 results) No results for input(s): PROBNP in the last 8760 hours.   Other Studies Reviewed Today:  CT ABDOMEN IMPRESSION 09/2017: 1. Sigmoid diverticulitis with adjacent tubular fluid collection suspicious for contained perforation and pelvic abscess. 2. Poor fat plane between the inflamed sigmoid colon and the vaginal cuff on the left. There is gas in the vagina. 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 Surgical consultation recommended. Follow-up CT with oral or rectal contrast, and intravenous contrast if possible, may be helpful to better evaluate the pelvic findings. 3. Small left pleural effusion. 4.  Aortic Atherosclerosis (ICD10-I70.0).   Echo Study Conclusions 05/2015  - Left ventricle: The cavity size was normal. There was mild   concentric hypertrophy. Systolic function was vigorous. The   estimated ejection fraction was in the range of 65% to 70%. Wall   motion was normal; there were no regional wall motion   abnormalities. Features are consistent with a pseudonormal left   ventricular filling pattern, with concomitant abnormal relaxation   and increased filling pressure (grade 2 diastolic dysfunction). - Aortic valve: There was trivial regurgitation. - Mitral valve: Moderately to severely calcified annulus. There was   mild regurgitation. - Left atrium: The atrium was mildly dilated. - Right atrium: The atrium was mildly dilated. - Tricuspid valve: There was moderate regurgitation. - Pulmonary arteries: Systolic pressure was moderately increased.   PA peak pressure: 52 mm Hg (S).   Angiographic Findings:  Left main: No left main. The LAD and Circumflex arise from different ostia.   Left Anterior Descending Artery: Large caliber vessel that courses to the apex. The vessel becomes small in caliber past the mid segment.  There is an ostial 90% stenosis. (There is significant pressure dampening of the catheter with engagement of the LAD). The mid vessel has 30% stenosis. Small caliber diagonal branch with mild plaque.   Circumflex Artery: Proximal 30% stenosis. Patent OM1 with no obstructive disease. The OM2 is a small to moderate caliber branch with filling from antegrade flow and from the patent vein graft. The distal Circumflex is occluded.   Right Coronary Artery: Large dominant vessel with diffuse 30% proximal and mid vessel stenosis. No flow limiting lesions noted.   Graft Anatomy:  SVG to OM2 is patent SVG to OM1 is occluded LIMA to LAD is atretic and non-functional  Left Ventricular Angiogram: LVEF=50-55%.   Impression: 1. Triple vessel CAD s/p 3 Vessel CABG with 1/3 patent grafts.  2. Severe stenosis ostium of native LAD, not protected by a graft (atretic flow in LIMA graft to this vessel) 3. Preserved LV systolic function 4. Successful PTCA/DES x 1 ostium of LAD  Recommendations: She will need ASA and Plavix for at least one year. Continue statin and beta blocker.        Complications:  None. The patient tolerated the procedure well.                 Assessment/Plan:  1. Pre op clearance for colon surgery as well as colonoscopy with general anesthesia - reports chest discomfort with walking - very sedentary - does not even meet 4 mets - has known CAD with prior CABG as well as  prior stent from 2015 - discussed with Dr. Acie Fredrickson here in the office today - we both agree that she is currently high risk and that cardiac cath is recommended to further discern. The patient understands that risks include but are not limited to stroke (1 in 1000), death (1 in 44), kidney failure [usually temporary] (1 in 500), bleeding (1 in 200), allergic reaction [possibly serious] (1 in 200), and agrees to proceed. Will arrange with Dr. Angelena Form. For now, she is to stay on her Plavix. Further disposition regarding  her surgical clearance to follow after her cardiac catheterization. Lab today. Orders done.   2. CAD - with angina - prior CABG and known 1/3 grafts patent - prior stent to the LAD from 2015 - see above.   3. HTN - BP ok on current regimen.   4. HLD - on statin - labs by PCP  5. Diastolic dysfunction - no over heart failure noted.   6. Valvular heart disease - last echo noted.   7. RAS - previously followed by Dr. Donnetta Hutching - she has not kept her follow up - fortunately, BP ok at this time.   Current medicines are reviewed with the patient today.  The patient does not have concerns regarding medicines other than what has been noted above.  The following changes have been made:  See above.  Labs/ tests ordered today include:    Orders Placed This Encounter  Procedures  . Basic metabolic panel  . CBC  . PT and PTT  . EKG 12-Lead     Disposition:   Further disposition pending.    Patient is agreeable to this plan and will call if any problems develop in the interim.   SignedTruitt Merle, NP  12/08/2017 10:41 AM  [facility] [address] [place], [place]  [address] Phone: [phone] Fax: [phone]

## 2017-12-08 NOTE — Telephone Encounter (Signed)
Pt seen today by Truitt Merle, NP

## 2017-12-08 NOTE — Progress Notes (Addendum)
CARDIOLOGY OFFICE NOTE  Date:  12/08/2017    Joanne [name] Date of Birth: Dec 24, 1940 Medical Record #[id number]  PCP:  Leanna Battles, MD  Cardiologist:  [facility]  Chief Complaint  Patient presents with  . Pre-op Exam    Work in visit - seen for Dr. Angelena Form    History of Present Illness: Joanne [name] is a 77 y.o. female who presents today for a pre op visit. Seen for Dr. Angelena Form. She is a former patient of Dr. Susa Simmonds.   She has a history of CAD s/p CABG in 4481, diastolic dysfunction, RBBB, renal artery stenosis, HTN, HLD, COPD, former tobacco abuse, breast cancer s/p lumpectomy 1997, post-op atrial fibrillation and anemia. Her coronary artery bypass grafting was in February 2011. She had a left internal mammary artery to the LAD, vein graft to OM2, and a vein graft to the RCA. She had cardiac cath December 2015 and a DES was placed in a severe ostial LAD stenosis. Last echo August 2016 with normal LV systolic function, trivial AI, mild MR.  Dr Early follows her renal artery stenosis.  She was last seen in January of 2018 by Lyda Jester, PA for follow up of an elevated blood pressure. She had seen Dr. Angelena Form just prior - very hypertensive - had to have clonidine here in the office. Was doing better on recheck. Did need ok to hold Plavix for a lumbar injection.    Comes in today. Here alone. She makes it very clear that she wishes for no one hear to talk to any of her family members. Apparently now needs a "colon surgery" - specifics not really noted in the pre op note. Tells me that she has a "hole in her colon, a growth on her left ovary and a fistula". Needs a colonoscopy first - she says under general anesthesia.   She has occasional palpitations - a couple of times of a month - feels it up in her throat - uses NTG - this helps and sometimes takes a second - says the spell will last about 25 to 30 minutes. No actual chest pain but has had "tightness" - she thought that  this was from her COPD. She is not active. She tries to walk her dog - this will make her chest tight and make her short of breath. Has to stop and rest and this will go away and she does not take NTG. This has been going on for quite some time. Seems to be about the same and not getting worse - but described as "something sitting on me". Remote smoker.   According to the Revised Cardiac Risk Index (RCRI), her Perioperative Risk of Major Cardiac Event is (%): 0.9  Her Functional Capacity in METs is: 3.63 according to the [facility] Activity Status Index (DASI).  Past Medical History:  Diagnosis Date  . Arthritis    "all over"  . Atrial fibrillation ([place])   . Breast cancer ([place]) 1997   "left"  . Chronic diastolic CHF (congestive heart failure) ([place])    a. 08/2010 Echo: EF 65-70%, Gr 1 DD, Mild MR  . Chronic lower back pain   . COPD (chronic obstructive pulmonary disease) ([place])   . Coronary artery disease    a. 11/2009 CABGx3: LIMA->LAD, VG->RCA, VG->OM2. b. Cath 10/11/2014 EF 50-55%,  patent SVG to OM2, occlueded SVG to OM1, atretic and nonfunctional LIMA to LAD, 90% ostial LAD lesion treated with PTCA/LAD  . GERD (gastroesophageal reflux disease)   .  Heart murmur   . History of blood transfusion 11/2009   "related to OHS"   . Hyperlipidemia   . Hypertension   . Left renal artery stenosis ([place])    a. Moderate L RAS  . Myocardial infarction ([place]) 2011  . Scoliosis of thoracic spine     Past Surgical History:  Procedure Laterality Date  . ABDOMINAL HYSTERECTOMY  1970's  . APPENDECTOMY  1970's  . BREAST BIOPSY Left 1997  . BREAST LUMPECTOMY Left 1997  . CARDIAC CATHETERIZATION  11/2009  . CORONARY ANGIOPLASTY WITH STENT PLACEMENT  10/11/2014   "1"  . CORONARY ARTERY BYPASS GRAFT  12/21/2009   CABGX3  Dr. Prescott Gum  . LAPAROSCOPIC CHOLECYSTECTOMY  2001  . LEFT HEART CATHETERIZATION WITH CORONARY ANGIOGRAM N/A 10/11/2014   Procedure: LEFT HEART CATHETERIZATION WITH CORONARY ANGIOGRAM;   Surgeon: Burnell Blanks, MD;  Location: [facility] CATH LAB;  Service: Cardiovascular;  Laterality: N/A;  . TONSILLECTOMY  1973  . TUBAL LIGATION  1982?     Medications: Current Meds  Medication Sig  . albuterol (PROVENTIL HFA;VENTOLIN HFA) 108 (90 BASE) MCG/ACT inhaler Inhale 1 puff into the lungs every 6 (six) hours as needed for wheezing or shortness of breath.  Marland Kitchen albuterol (PROVENTIL) (2.5 MG/3ML) 0.083% nebulizer solution Take 2.5 mg by nebulization 4 (four) times daily.   Marland Kitchen ALPRAZolam (XANAX) 0.25 MG tablet Take 0.25 mg by mouth daily as needed for anxiety.   . budesonide-formoterol (SYMBICORT) 160-4.5 MCG/ACT inhaler Inhale 2 puffs into the lungs 2 (two) times daily.  . cloNIDine (CATAPRES) 0.1 MG tablet Take 1 tablet (0.1 mg total) by mouth 2 (two) times daily.  . clopidogrel (PLAVIX) 75 MG tablet Take 1 tablet (75 mg total) by mouth daily with breakfast.  . CRESTOR 40 MG tablet TAKE 1 TABLET BY MOUTH DAILY (Patient taking differently: Take 40 mg by mouth once a day)  . ferrous sulfate 325 (65 FE) MG tablet Take 1 tablet (325 mg total) by mouth 2 (two) times daily with a meal. (Patient taking differently: Take 325 mg by mouth daily. )  . FLUoxetine (PROZAC) 20 MG capsule Take 20 mg by mouth daily.  Marland Kitchen ipratropium (ATROVENT) 0.02 % nebulizer solution Take 0.5 mg by nebulization 2 (two) times daily.   . isosorbide mononitrate (IMDUR) 30 MG 24 hr tablet Take 1 tablet (30 mg total) by mouth daily.  Marland Kitchen lactobacillus acidophilus (BACID) TABS tablet Take 2 tablets by mouth 3 (three) times daily.  . metoprolol tartrate (LOPRESSOR) 50 MG tablet Take 1 tablet (50 mg total) by mouth 2 (two) times daily.  . minoxidil (LONITEN) 2.5 MG tablet Take 2.5 mg by mouth daily.  . nitroGLYCERIN (NITROSTAT) 0.4 MG SL tablet Place 1 tablet (0.4 mg total) under the tongue every 5 (five) minutes as needed for chest pain. (Patient taking differently: Place 0.4 mg under the tongue every morning. )  . pantoprazole  (PROTONIX) 40 MG tablet TAKE 1 TABLET EVERY DAY (Patient taking differently: Take 40 mg by mouth once a day)  . traMADol (ULTRAM) 50 MG tablet Take 1 tablet (50 mg total) by mouth every 12 (twelve) hours as needed for severe pain.  . verapamil (COVERA HS) 240 MG (CO) 24 hr tablet Take 240 mg by mouth every evening.   . zolpidem (AMBIEN) 10 MG tablet Take 0.5 tablets (5 mg total) by mouth at bedtime as needed for sleep.     Allergies: Allergies  Allergen Reactions  . Iohexol Anaphylaxis, Shortness Of Breath and  Swelling     Code: SOB, Desc: ct @ ser w/ throat swelling & sob, ok w/ 13 hr prep//a.c., Onset Date: [id number]   . Penicillins Anaphylaxis    Tolerated Zosyn 09/2017.  TDD.  Has patient had a PCN reaction causing immediate rash, facial/tongue/throat swelling, SOB or lightheadedness with hypotension: Yes Has patient had a PCN reaction causing severe rash involving mucus membranes or skin necrosis: No Has patient had a PCN reaction that required hospitalization: No Has patient had a PCN reaction occurring within the last 10 years: No If all of the above answers are "NO", then may proceed with Cephalosporin use.     Social History: The patient  reports that she quit smoking about 7 years ago. Her smoking use included cigarettes. She has a 100.00 pack-year smoking history. she has never used smokeless tobacco. She reports that she does not drink alcohol or use drugs.   Family History: The patient's family history includes COPD in her father; Cancer in her sister; Diabetes in her brother and mother; Heart attack in her brother; Heart disease in her brother and mother; Heart failure in her grandchild and mother; Hyperlipidemia in her brother and mother; Hypertension in her brother and mother.   Review of Systems: Please see the history of present illness.   Otherwise, the review of systems is positive for none.   All other systems are reviewed and negative.   Physical Exam: VS:  BP  116/60 (BP Location: Left Arm, Patient Position: Sitting, Cuff Size: Normal)   Pulse 67   Ht 5\' 2"  (1.575 m)   Wt 158 lb 12.8 oz (72 kg)   BMI 29.04 kg/m  .  BMI Body mass index is 29.04 kg/m.  Wt Readings from Last 3 Encounters:  12/08/17 158 lb 12.8 oz (72 kg)  10/27/17 161 lb 6 oz (73.2 kg)  11/06/16 169 lb (76.7 kg)    General: Pleasant. Well developed, well nourished and in no acute distress.   HEENT: Normal.  Neck: Supple, no JVD, carotid bruits, or masses noted.  Cardiac: Regular rate and rhythm. No murmurs, rubs, or gallops. No edema.  Respiratory:  Lungs are clear to auscultation bilaterally with normal work of breathing.  GI: Soft and nontender.  MS: No deformity or atrophy. Gait and ROM intact.  Skin: Warm and dry. Color is normal.  Neuro:  Strength and sensation are intact and no gross focal deficits noted.  Psych: Alert, appropriate and with normal affect.   LABORATORY DATA:  EKG:  EKG is ordered today. This demonstrates NSR with RBBB - reviewed with Dr. Acie Fredrickson (DOD).  Lab Results  Component Value Date   WBC 7.8 10/26/2017   HGB 7.7 (L) 10/26/2017   HCT 27.9 (L) 10/26/2017   PLT 217 10/26/2017   GLUCOSE 156 (H) 10/27/2017   CHOL 196 05/25/2013   TRIG 209.0 (H) 05/25/2013   HDL 48.30 05/25/2013   LDLDIRECT 124.3 05/25/2013   ALT 13 (L) 10/26/2017   AST 15 10/26/2017   NA 138 10/27/2017   K 3.6 10/27/2017   CL 111 10/27/2017   CREATININE 1.15 (H) 10/27/2017   BUN <5 (L) 10/27/2017   CO2 19 (L) 10/27/2017   TSH  01/21/2010    1.117 (NOTE)  Please note change in reference ranges for ages 55W to 1Y. Test methodology is 3rd generation TSH   INR 1.09 10/24/2017   HGBA1C (H) 12/19/2009    6.2 (NOTE) The ADA recommends the following therapeutic goal for glycemic control related  to Hgb A1c measurement: Goal of therapy: <6.5 Hgb A1c  Reference: American Diabetes Association: Clinical Practice Recommendations 2010, Diabetes Care, 2010, 33: (Suppl  1).      BNP (last 3 results) Recent Labs    10/23/17 1512  BNP 622.9*    ProBNP (last 3 results) No results for input(s): PROBNP in the last 8760 hours.   Other Studies Reviewed Today:  CT ABDOMEN IMPRESSION 09/2017: 1. Sigmoid diverticulitis with adjacent tubular fluid collection suspicious for contained perforation and pelvic abscess. 2. Poor fat plane between the inflamed sigmoid colon and the vaginal cuff on the left. There is gas in the vagina. 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 Surgical consultation recommended. Follow-up CT with oral or rectal contrast, and intravenous contrast if possible, may be helpful to better evaluate the pelvic findings. 3. Small left pleural effusion. 4.  Aortic Atherosclerosis (ICD10-I70.0).   Echo Study Conclusions 05/2015  - Left ventricle: The cavity size was normal. There was mild   concentric hypertrophy. Systolic function was vigorous. The   estimated ejection fraction was in the range of 65% to 70%. Wall   motion was normal; there were no regional wall motion   abnormalities. Features are consistent with a pseudonormal left   ventricular filling pattern, with concomitant abnormal relaxation   and increased filling pressure (grade 2 diastolic dysfunction). - Aortic valve: There was trivial regurgitation. - Mitral valve: Moderately to severely calcified annulus. There was   mild regurgitation. - Left atrium: The atrium was mildly dilated. - Right atrium: The atrium was mildly dilated. - Tricuspid valve: There was moderate regurgitation. - Pulmonary arteries: Systolic pressure was moderately increased.   PA peak pressure: 52 mm Hg (S).   Angiographic Findings:  Left main: No left main. The LAD and Circumflex arise from different ostia.   Left Anterior Descending Artery: Large caliber vessel that courses to the apex. The vessel becomes small in caliber past the mid segment.  There is an ostial 90% stenosis. (There is significant pressure dampening of the catheter with engagement of the LAD). The mid vessel has 30% stenosis. Small caliber diagonal branch with mild plaque.   Circumflex Artery: Proximal 30% stenosis. Patent OM1 with no obstructive disease. The OM2 is a small to moderate caliber branch with filling from antegrade flow and from the patent vein graft. The distal Circumflex is occluded.   Right Coronary Artery: Large dominant vessel with diffuse 30% proximal and mid vessel stenosis. No flow limiting lesions noted.   Graft Anatomy:  SVG to OM2 is patent SVG to OM1 is occluded LIMA to LAD is atretic and non-functional  Left Ventricular Angiogram: LVEF=50-55%.   Impression: 1. Triple vessel CAD s/p 3 Vessel CABG with 1/3 patent grafts.  2. Severe stenosis ostium of native LAD, not protected by a graft (atretic flow in LIMA graft to this vessel) 3. Preserved LV systolic function 4. Successful PTCA/DES x 1 ostium of LAD  Recommendations: She will need ASA and Plavix for at least one year. Continue statin and beta blocker.        Complications:  None. The patient tolerated the procedure well.                 Assessment/Plan:  1. Pre op clearance for colon surgery as well as colonoscopy with general anesthesia - reports chest discomfort with walking - very sedentary - does not even meet 4 mets - has known CAD with prior CABG as well as  prior stent from 2015 - discussed with Dr. Acie Fredrickson here in the office today - we both agree that she is currently high risk and that cardiac cath is recommended to further discern. The patient understands that risks include but are not limited to stroke (1 in 1000), death (1 in 38), kidney failure [usually temporary] (1 in 500), bleeding (1 in 200), allergic reaction [possibly serious] (1 in 200), and agrees to proceed. Will arrange with Dr. Angelena Form. For now, she is to stay on her Plavix. Further disposition regarding  her surgical clearance to follow after her cardiac catheterization. Lab today. Orders done.   2. CAD - with angina - prior CABG and known 1/3 grafts patent - prior stent to the LAD from 2015 - see above.   3. HTN - BP ok on current regimen.   4. HLD - on statin - labs by PCP  5. Diastolic dysfunction - no over heart failure noted.   6. Valvular heart disease - last echo noted.   7. RAS - previously followed by Dr. Donnetta Hutching - she has not kept her follow up - fortunately, BP ok at this time.   Current medicines are reviewed with the patient today.  The patient does not have concerns regarding medicines other than what has been noted above.  The following changes have been made:  See above.  Labs/ tests ordered today include:    Orders Placed This Encounter  Procedures  . Basic metabolic panel  . CBC  . PT and PTT  . EKG 12-Lead     Disposition:   Further disposition pending.    Patient is agreeable to this plan and will call if any problems develop in the interim.   SignedTruitt Merle, NP  12/08/2017 10:41 AM  [place] [address] [place], [place]  [address] Phone: [phone] Fax: [phone]

## 2017-12-09 LAB — BASIC METABOLIC PANEL
BUN/Creatinine Ratio: 17 (ref 12–28)
BUN: 19 mg/dL (ref 8–27)
CO2: 23 mmol/L (ref 20–29)
Calcium: 9.4 mg/dL (ref 8.7–10.3)
Chloride: 104 mmol/L (ref 96–106)
Creatinine, Ser: 1.1 mg/dL — ABNORMAL HIGH (ref 0.57–1.00)
GFR calc Af Amer: 57 mL/min/{1.73_m2} — ABNORMAL LOW (ref >59)
GFR calc non Af Amer: 49 mL/min/{1.73_m2} — ABNORMAL LOW (ref >59)
Glucose: 135 mg/dL — ABNORMAL HIGH (ref 65–99)
Potassium: 5.1 mmol/L (ref 3.5–5.2)
Sodium: 143 mmol/L (ref 134–144)

## 2017-12-09 LAB — PT AND PTT
INR: 1 (ref 0.8–1.2)
Prothrombin Time: 10 s (ref 9.1–12.0)
aPTT: 24 s (ref 24–33)

## 2017-12-09 LAB — CBC
Hematocrit: 30.4 % — ABNORMAL LOW (ref 34.0–46.6)
Hemoglobin: 8.9 g/dL — ABNORMAL LOW (ref 11.1–15.9)
MCH: 21.1 pg — ABNORMAL LOW (ref 26.6–33.0)
MCHC: 29.3 g/dL — ABNORMAL LOW (ref 31.5–35.7)
MCV: 72 fL — ABNORMAL LOW (ref 79–97)
Platelets: 368 10*3/uL (ref 150–379)
RBC: 4.22 x10E6/uL (ref 3.77–5.28)
RDW: 18 % — ABNORMAL HIGH (ref 12.3–15.4)
WBC: 9.5 10*3/uL (ref 3.4–10.8)

## 2017-12-15 ENCOUNTER — Telehealth: Payer: Self-pay | Admitting: *Deleted

## 2017-12-15 MED ORDER — PREDNISONE 50 MG PO TABS: ORAL_TABLET | ORAL | 0 refills | Status: DC

## 2017-12-15 NOTE — Telephone Encounter (Signed)
Pt contacted pre-catheterization scheduled at [facility] for: Friday February 22,2019 at 10:30 AM Verified arrival time and place: [place] at: 8 AM Verified no diabetes medications.  Nothing to eat or drink after midnight night before procedure.  Verified AM meds can be taken pre-cath with sip of water including: ASA 81 mg am of cath Plavix 75 mg am of cath Prednisone 50 mg am of cath Benadryl 50 mg am of cath   Confirmed patient has responsible person to drive home post procedure and observe patient for 24 hours: yes  I did confirm Iohexol/contrast allergy with patient. Pt was premedicated with prednisone prior to cath in 2015, see note in Epic 09/27/14.  I have instructed patient on 13 hour prednisone and benadryl prep pre-cath, she verbalized understanding. Pt is aware she should  take prednisone 50 mg at 9:30 PM 12/17/17, prednisone 50 mg at 3:30 AM 12/18/17, prednisone 50 mg and benadryl 50 mg 12/18/17 AM at home before she goes to hospital for cath.   Pt advised not to drive to hospital.

## 2017-12-18 ENCOUNTER — Encounter (HOSPITAL_COMMUNITY)
Admission: RE | Disposition: A | Discharge status: Home / Self Care | Payer: Self-pay | Source: Ambulatory Visit | Attending: Cardiovascular Disease

## 2017-12-18 ENCOUNTER — Encounter (HOSPITAL_COMMUNITY): Payer: Self-pay | Admitting: Cardiovascular Disease

## 2017-12-18 ENCOUNTER — Other Ambulatory Visit: Payer: Self-pay | Admitting: Cardiovascular Disease

## 2017-12-18 ENCOUNTER — Ambulatory Visit (HOSPITAL_COMMUNITY)
Admission: RE | Admit: 2017-12-18 | Discharge: 2017-12-18 | Disposition: A | Discharge status: Home / Self Care | Payer: Medicare HMO | Source: Ambulatory Visit | Attending: Cardiovascular Disease | Admitting: Cardiovascular Disease

## 2017-12-18 DIAGNOSIS — E785 Hyperlipidemia, unspecified: Secondary | ICD-10-CM | POA: Diagnosis not present

## 2017-12-18 DIAGNOSIS — I25719 Atherosclerosis of autologous vein coronary artery bypass graft(s) with unspecified angina pectoris: Secondary | ICD-10-CM | POA: Insufficient documentation

## 2017-12-18 DIAGNOSIS — G8929 Other chronic pain: Secondary | ICD-10-CM | POA: Diagnosis not present

## 2017-12-18 DIAGNOSIS — I252 Old myocardial infarction: Secondary | ICD-10-CM | POA: Diagnosis not present

## 2017-12-18 DIAGNOSIS — M419 Scoliosis, unspecified: Secondary | ICD-10-CM | POA: Insufficient documentation

## 2017-12-18 DIAGNOSIS — I4510 Unspecified right bundle-branch block: Secondary | ICD-10-CM | POA: Diagnosis not present

## 2017-12-18 DIAGNOSIS — Z880 Allergy status to penicillin: Secondary | ICD-10-CM | POA: Insufficient documentation

## 2017-12-18 DIAGNOSIS — Z87891 Personal history of nicotine dependence: Secondary | ICD-10-CM | POA: Diagnosis not present

## 2017-12-18 DIAGNOSIS — K219 Gastro-esophageal reflux disease without esophagitis: Secondary | ICD-10-CM | POA: Diagnosis not present

## 2017-12-18 DIAGNOSIS — J449 Chronic obstructive pulmonary disease, unspecified: Secondary | ICD-10-CM | POA: Insufficient documentation

## 2017-12-18 DIAGNOSIS — I4891 Unspecified atrial fibrillation: Secondary | ICD-10-CM | POA: Diagnosis not present

## 2017-12-18 DIAGNOSIS — Z7951 Long term (current) use of inhaled steroids: Secondary | ICD-10-CM | POA: Diagnosis not present

## 2017-12-18 DIAGNOSIS — I5032 Chronic diastolic (congestive) heart failure: Secondary | ICD-10-CM | POA: Insufficient documentation

## 2017-12-18 DIAGNOSIS — M545 Low back pain: Secondary | ICD-10-CM | POA: Insufficient documentation

## 2017-12-18 DIAGNOSIS — Z955 Presence of coronary angioplasty implant and graft: Secondary | ICD-10-CM | POA: Diagnosis not present

## 2017-12-18 DIAGNOSIS — R0602 Shortness of breath: Secondary | ICD-10-CM

## 2017-12-18 DIAGNOSIS — I25119 Atherosclerotic heart disease of native coronary artery with unspecified angina pectoris: Secondary | ICD-10-CM

## 2017-12-18 DIAGNOSIS — Z7902 Long term (current) use of antithrombotics/antiplatelets: Secondary | ICD-10-CM | POA: Diagnosis not present

## 2017-12-18 DIAGNOSIS — I110 Hypertensive heart disease with heart failure: Secondary | ICD-10-CM | POA: Insufficient documentation

## 2017-12-18 DIAGNOSIS — Z8249 Family history of ischemic heart disease and other diseases of the circulatory system: Secondary | ICD-10-CM | POA: Diagnosis not present

## 2017-12-18 HISTORY — PX: LEFT HEART CATH AND CORS/GRAFTS ANGIOGRAPHY: CATH118250

## 2017-12-18 SURGERY — LEFT HEART CATH AND CORS/GRAFTS ANGIOGRAPHY
Anesthesia: LOCAL

## 2017-12-18 MED ORDER — LIDOCAINE HCL (PF) 1 % IJ SOLN
INTRAMUSCULAR | Status: DC | PRN
  Administered 2017-12-18: 2 mL

## 2017-12-18 MED ORDER — VERAPAMIL HCL 2.5 MG/ML IV SOLN
INTRAVENOUS | Status: DC | PRN
  Administered 2017-12-18: 10 mL via INTRA_ARTERIAL

## 2017-12-18 MED ORDER — FENTANYL CITRATE (PF) 100 MCG/2ML IJ SOLN
INTRAMUSCULAR | Status: DC | PRN
  Administered 2017-12-18 (×2): 25 ug via INTRAVENOUS

## 2017-12-18 MED ORDER — MIDAZOLAM HCL 2 MG/2ML IJ SOLN
INTRAMUSCULAR | Status: DC | PRN
  Administered 2017-12-18 (×2): 1 mg via INTRAVENOUS

## 2017-12-18 MED ORDER — ALBUTEROL SULFATE HFA 108 (90 BASE) MCG/ACT IN AERS: 1.0000 | INHALATION_SPRAY | Freq: Four times a day (QID) | RESPIRATORY_TRACT | Status: DC | PRN

## 2017-12-18 MED ORDER — HEPARIN (PORCINE) IN NACL 2-0.9 UNIT/ML-% IJ SOLN
INTRAMUSCULAR | Status: AC | PRN
  Administered 2017-12-18 (×2): 500 mL

## 2017-12-18 MED ORDER — LIDOCAINE HCL (PF) 1 % IJ SOLN
INTRAMUSCULAR | Status: AC
  Filled 2017-12-18: qty 30

## 2017-12-18 MED ORDER — DIAZEPAM 5 MG PO TABS
ORAL_TABLET | ORAL | Status: AC
  Administered 2017-12-18: 10 mg via ORAL
  Filled 2017-12-18: qty 2

## 2017-12-18 MED ORDER — IOPAMIDOL (ISOVUE-370) INJECTION 76%
INTRAVENOUS | Status: AC
  Filled 2017-12-18: qty 100

## 2017-12-18 MED ORDER — HEPARIN SODIUM (PORCINE) 1000 UNIT/ML IJ SOLN
INTRAMUSCULAR | Status: DC | PRN
  Administered 2017-12-18: 3500 [IU] via INTRAVENOUS

## 2017-12-18 MED ORDER — SODIUM CHLORIDE 0.9 % IV SOLN: INTRAVENOUS | Status: DC

## 2017-12-18 MED ORDER — HEPARIN SODIUM (PORCINE) 1000 UNIT/ML IJ SOLN
INTRAMUSCULAR | Status: AC
  Filled 2017-12-18: qty 1

## 2017-12-18 MED ORDER — SODIUM CHLORIDE 0.9% FLUSH: 3.0000 mL | INTRAVENOUS | Status: DC | PRN

## 2017-12-18 MED ORDER — FENTANYL CITRATE (PF) 100 MCG/2ML IJ SOLN
INTRAMUSCULAR | Status: AC
  Filled 2017-12-18: qty 2

## 2017-12-18 MED ORDER — IOPAMIDOL (ISOVUE-370) INJECTION 76%
INTRAVENOUS | Status: DC | PRN
  Administered 2017-12-18: 80 mL

## 2017-12-18 MED ORDER — ASPIRIN 81 MG PO CHEW: 81.0000 mg | CHEWABLE_TABLET | ORAL | Status: DC

## 2017-12-18 MED ORDER — SODIUM CHLORIDE 0.9% FLUSH: 3.0000 mL | Freq: Two times a day (BID) | INTRAVENOUS | Status: DC

## 2017-12-18 MED ORDER — MOMETASONE FURO-FORMOTEROL FUM 200-5 MCG/ACT IN AERO
2.0000 | INHALATION_SPRAY | Freq: Two times a day (BID) | RESPIRATORY_TRACT | Status: DC
  Administered 2017-12-18: 2 via RESPIRATORY_TRACT
  Filled 2017-12-18: qty 8.8

## 2017-12-18 MED ORDER — DIAZEPAM 5 MG PO TABS
10.0000 mg | ORAL_TABLET | ORAL | Status: AC
  Administered 2017-12-18: 10 mg via ORAL

## 2017-12-18 MED ORDER — ALBUTEROL SULFATE (2.5 MG/3ML) 0.083% IN NEBU
INHALATION_SOLUTION | RESPIRATORY_TRACT | Status: AC
  Filled 2017-12-18: qty 3

## 2017-12-18 MED ORDER — ALBUTEROL SULFATE (2.5 MG/3ML) 0.083% IN NEBU: 2.5000 mg | INHALATION_SOLUTION | Freq: Two times a day (BID) | RESPIRATORY_TRACT | Status: DC

## 2017-12-18 MED ORDER — HEPARIN (PORCINE) IN NACL 2-0.9 UNIT/ML-% IJ SOLN
INTRAMUSCULAR | Status: AC
  Filled 2017-12-18: qty 1000

## 2017-12-18 MED ORDER — VERAPAMIL HCL 2.5 MG/ML IV SOLN
INTRAVENOUS | Status: AC
  Filled 2017-12-18: qty 2

## 2017-12-18 MED ORDER — SODIUM CHLORIDE 0.9 % IV SOLN
INTRAVENOUS | Status: DC
  Administered 2017-12-18: 09:00:00 via INTRAVENOUS

## 2017-12-18 MED ORDER — SODIUM CHLORIDE 0.9 % IV SOLN: 250.0000 mL | INTRAVENOUS | Status: DC | PRN

## 2017-12-18 MED ORDER — MIDAZOLAM HCL 2 MG/2ML IJ SOLN
INTRAMUSCULAR | Status: AC
Start: 2017-12-18 — End: 2017-12-18
  Filled 2017-12-18: qty 2

## 2017-12-18 SURGICAL SUPPLY — 10 items
CATH 5FR JL3.5 JR4 ANG PIG MP (CATHETERS) ×2 IMPLANT
CATH INFINITI 5 FR 3DRC (CATHETERS) ×2 IMPLANT
DEVICE RAD COMP TR BAND LRG (VASCULAR PRODUCTS) ×2 IMPLANT
GLIDESHEATH SLEND SS 6F .021 (SHEATH) ×2 IMPLANT
GUIDEWIRE INQWIRE 1.5J.035X260 (WIRE) ×1 IMPLANT
INQWIRE 1.5J .035X260CM (WIRE) ×2
KIT HEART LEFT (KITS) ×2 IMPLANT
PACK CARDIAC CATHETERIZATION (CUSTOM PROCEDURE TRAY) ×2 IMPLANT
TRANSDUCER W/STOPCOCK (MISCELLANEOUS) ×2 IMPLANT
TUBING CIL FLEX 10 FLL-RA (TUBING) ×2 IMPLANT

## 2017-12-18 NOTE — Discharge Instructions (Signed)

## 2017-12-18 NOTE — Progress Notes (Signed)
Notified by short stay that patient had some wheezing after moving about pre-operatively. She uses BID nebs at home as well as PRN inhaler. O2 sat 91% on arrival per vitals recorded. Nurse rechecked O2 sat - initially registered in high 80s, but came up to 98% without intervention at rest. Will order home neb. I told nurse if wheezing/dyspnea persists, may need assessment by Dr. Angelena Form for fitness for cath today. Dayna Dunn PA-C  Addendum: nurse states patient is doing better without intervention and Dr. Angelena Form has been in to assess so holding off on nebs. I did order her home Symbicort (formulary sub Dulera) as she did not take this this morning. Dayna Dunn PA-C

## 2017-12-18 NOTE — Interval H&P Note (Signed)
History and Physical Interval Note:  12/18/2017 8:56 AM  Joanne [name]  has presented today for cardiac cath with the diagnosis of CAD with unstable angina  The various methods of treatment have been discussed with the patient and family. After consideration of risks, benefits and other options for treatment, the patient has consented to  Procedure(s): LEFT HEART CATH AND CORS/GRAFTS ANGIOGRAPHY (N/A) as a surgical intervention .  The patient's history has been reviewed, patient examined, no change in status, stable for surgery.  I have reviewed the patient's chart and labs.  Questions were answered to the patient's satisfaction.    Cath Lab Visit (complete for each Cath Lab visit)  Clinical Evaluation Leading to the Procedure:   ACS: No.  Non-ACS:    Anginal Classification: CCS III  Anti-ischemic medical therapy: Maximal Therapy (2 or more classes of medications)  Non-Invasive Test Results: No non-invasive testing performed  Prior CABG: Previous CABG        Lauree Chandler

## 2017-12-21 MED FILL — Heparin Sodium (Porcine) 2 Unit/ML in Sodium Chloride 0.9%: INTRAMUSCULAR | Qty: 1000 | Status: AC

## 2018-01-26 ENCOUNTER — Encounter: Payer: Self-pay | Admitting: Emergency Medicine

## 2018-01-26 ENCOUNTER — Ambulatory Visit (INDEPENDENT_AMBULATORY_CARE_PROVIDER_SITE_OTHER): Payer: Medicare HMO | Admitting: Emergency Medicine

## 2018-01-26 DIAGNOSIS — J410 Simple chronic bronchitis: Secondary | ICD-10-CM

## 2018-01-26 MED ORDER — GLYCOPYRROLATE-FORMOTEROL 9-4.8 MCG/ACT IN AERO: 2.0000 | INHALATION_SPRAY | Freq: Two times a day (BID) | RESPIRATORY_TRACT | 0 refills | Status: AC

## 2018-01-26 NOTE — Assessment & Plan Note (Signed)
She has had progressive dyspnea over years and most bothersome over the last 6 months.  She underwent a cardiac catheterization that was reassuring.  I do believe that this is likely a manifestation of progressive COPD.  She is been on the same bronchodilators for years.  Also she had borderline desaturation with exertion even 6 years ago at our last visit.  She may very well desaturate today.  If so we will arrange for portable oxygen.  I believe that she would benefit from adding a LAMA to her regimen.  I will change her to Bevespi bid to see if she benefits.  I will defer risk stratification for Dr. Paulita Fujita until we have her on an optimal bronchodilator regimen.  Even when she is on a different inhaled regimen she still will likely be high risk for any procedure with general anesthesia.  Walking oximetry on room air today. If you qualify for oxygen we will attempt to get you an inogen portable oxygen concentrator.  Please stop Symbicort for now.  Start Bevespi 2 puffs twice a day.  If you benefit from this medication then we will send a prescription to your pharmacy and continue it. Please keep Ventolin (albuterol) available to use 2 puffs if needed for shortness of breath, chest tightness, wheezing. We will perform full pulmonary function testing at your next visit to compare with your priors. Follow with Dr Lamonte Sakai in 1 month or next available with full PFT on the same day.

## 2018-01-26 NOTE — Addendum Note (Signed)
Addended by: Desmond Dike C on: 01/26/2018 04:12 PM   Modules accepted: Orders

## 2018-01-26 NOTE — Progress Notes (Signed)
Subjective:    Patient ID: Joanne [name], female    DOB: 05/28/1941, 77 y.o.   MRN: [id number]  HPI 77 year old woman  whom I have followed in the past for COPD.  She also has a history of coronary disease/CABG, hypertension, atrial fibrillation, breast cancer.  I last saw her 08/27/12.  She returns today reporting that she has had progressive exertional SOB over the last 6 months - she cannot do her housework. She has had several apparent flares with dyspnea, treated with steroids by Dr Sharlett Iles, last was 3 months ago.   She has been on symbicort for many years. She uses ventolin bid.   She notes that she needs a CSY w Dr Paulita Fujita, is waiting for pulm optimization and clearance to proceed with this,   She underwent a left heart catheterization 12/18/17 that showed stable multivessel disease, no re-stenosis of her LAD stent.  Normal LV filling pressures.   Review of Systems  Constitutional: Negative for fever and unexpected weight change.  HENT: Negative for congestion, dental problem, ear pain, nosebleeds, postnasal drip, rhinorrhea, sinus pressure, sneezing, sore throat and trouble swallowing.   Eyes: Negative for redness and itching.  Respiratory: Positive for shortness of breath and wheezing. Negative for cough and chest tightness.   Cardiovascular: Negative for palpitations and leg swelling.  Gastrointestinal: Negative for nausea and vomiting.  Genitourinary: Negative for dysuria.  Musculoskeletal: Negative for joint swelling.  Skin: Negative for rash.  Neurological: Negative for headaches.  Hematological: Does not bruise/bleed easily.  Psychiatric/Behavioral: Negative for dysphoric mood. The patient is not nervous/anxious.    Past Medical History:  Diagnosis Date  . Arthritis    "all over"  . Atrial fibrillation ([place])   . Breast cancer ([place]) 1997   "left"  . Chronic diastolic CHF (congestive heart failure) ([place])    a. 08/2010 Echo: EF 65-70%, Gr 1 DD, Mild MR  . Chronic lower  back pain   . COPD (chronic obstructive pulmonary disease) ([place])   . Coronary artery disease    a. 11/2009 CABGx3: LIMA->LAD, VG->RCA, VG->OM2. b. Cath 10/11/2014 EF 50-55%,  patent SVG to OM2, occlueded SVG to OM1, atretic and nonfunctional LIMA to LAD, 90% ostial LAD lesion treated with PTCA/LAD  . GERD (gastroesophageal reflux disease)   . Heart murmur   . History of blood transfusion 11/2009   "related to OHS"   . Hyperlipidemia   . Hypertension   . Left renal artery stenosis ([place])    a. Moderate L RAS  . Myocardial infarction ([place]) 2011  . Scoliosis of thoracic spine      Family History  Problem Relation Age of Onset  . Heart failure Mother   . Heart disease Mother        Before age 27  . Diabetes Mother   . Hyperlipidemia Mother   . Hypertension Mother   . COPD Father   . Heart disease Brother        Before age 43  . Hyperlipidemia Brother   . Hypertension Brother   . Cancer Sister        small  . Heart failure Grandchild   . Heart attack Brother   . Diabetes Brother      Social History   Socioeconomic History  . Marital status: Widowed    Spouse name: Not on file  . Number of children: 6  . Years of education: Not on file  . Highest education level: Not on file  Occupational History  .  Occupation: Retired-[employer]    Employer: RETIRED  Social Needs  . Financial resource strain: Not on file  . Food insecurity:    Worry: Not on file    Inability: Not on file  . Transportation needs:    Medical: Not on file    Non-medical: Not on file  Tobacco Use  . Smoking status: Former Smoker    Packs/day: 2.00    Years: 50.00    Pack years: 100.00    Types: Cigarettes    Last attempt to quit: 01/01/2010    Years since quitting: 8.0  . Smokeless tobacco: Never Used  Substance and Sexual Activity  . Alcohol use: No  . Drug use: No  . Sexual activity: Not on file  Lifestyle  . Physical activity:    Days per week: Not on file    Minutes per session: Not on file    . Stress: Not on file  Relationships  . Social connections:    Talks on phone: Not on file    Gets together: Not on file    Attends religious service: Not on file    Active member of club or organization: Not on file    Attends meetings of clubs or organizations: Not on file    Relationship status: Not on file  . Intimate partner violence:    Fear of current or ex partner: Not on file    Emotionally abused: Not on file    Physically abused: Not on file    Forced sexual activity: Not on file  Other Topics Concern  . Not on file  Social History Narrative  . Not on file     Allergies  Allergen Reactions  . Iohexol Anaphylaxis, Shortness Of Breath, Swelling and Other (See Comments)     Code: SOB, Desc: ct @ ser w/ throat swelling & sob, ok w/ 13 hr prep//a.c., Onset Date: 43329518   . Penicillins Anaphylaxis and Other (See Comments)    Tolerated Zosyn 09/2017.  TDD.  Has patient had a PCN reaction causing immediate rash, facial/tongue/throat swelling, SOB or lightheadedness with hypotension: Yes Has patient had a PCN reaction causing severe rash involving mucus membranes or skin necrosis: No Has patient had a PCN reaction that required hospitalization: No Has patient had a PCN reaction occurring within the last 10 years: No If all of the above answers are "NO", then may proceed with Cephalosporin use.      Outpatient Medications Prior to Visit  Medication Sig Dispense Refill  . albuterol (PROVENTIL HFA;VENTOLIN HFA) 108 (90 BASE) MCG/ACT inhaler Inhale 1 puff into the lungs every 6 (six) hours as needed for wheezing or shortness of breath.    Marland Kitchen albuterol (PROVENTIL) (2.5 MG/3ML) 0.083% nebulizer solution Take 2.5 mg by nebulization 2 (two) times daily.     Marland Kitchen ALPRAZolam (XANAX) 0.25 MG tablet Take 0.25 mg by mouth daily as needed for anxiety.   3  . budesonide-formoterol (SYMBICORT) 160-4.5 MCG/ACT inhaler Inhale 2 puffs into the lungs 2 (two) times daily.    . cloNIDine  (CATAPRES) 0.1 MG tablet Take 1 tablet (0.1 mg total) by mouth 2 (two) times daily. 60 tablet 0  . CRESTOR 40 MG tablet TAKE 1 TABLET BY MOUTH DAILY (Patient taking differently: Take 40 mg by mouth once a day) 30 tablet 0  . diphenhydrAMINE (BENADRYL) 25 MG tablet Take 50 mg by mouth once.    . ferrous sulfate 325 (65 FE) MG tablet Take 1 tablet (325 mg total) by  mouth 2 (two) times daily with a meal. (Patient taking differently: Take 325 mg by mouth daily. ) 60 tablet 3  . FLUoxetine (PROZAC) 20 MG capsule Take 20 mg by mouth daily.    Marland Kitchen ipratropium (ATROVENT) 0.02 % nebulizer solution Take 0.5 mg by nebulization 2 (two) times daily.     . isosorbide mononitrate (IMDUR) 30 MG 24 hr tablet TAKE 1 TABLET (30 MG TOTAL) BY MOUTH DAILY. 30 tablet 11  . metoprolol tartrate (LOPRESSOR) 50 MG tablet Take 1 tablet (50 mg total) by mouth 2 (two) times daily. 60 tablet 6  . minoxidil (LONITEN) 2.5 MG tablet Take 2.5 mg by mouth daily.  10  . nitroGLYCERIN (NITROSTAT) 0.4 MG SL tablet Place 1 tablet (0.4 mg total) under the tongue every 5 (five) minutes as needed for chest pain. 25 tablet 1  . pantoprazole (PROTONIX) 40 MG tablet TAKE 1 TABLET EVERY DAY (Patient taking differently: Take 40 mg by mouth once a day) 30 tablet 0  . verapamil (COVERA HS) 240 MG (CO) 24 hr tablet Take 240 mg by mouth every evening.     . zolpidem (AMBIEN) 10 MG tablet Take 0.5 tablets (5 mg total) by mouth at bedtime as needed for sleep. (Patient taking differently: Take 5-10 mg by mouth at bedtime as needed for sleep. )    . clopidogrel (PLAVIX) 75 MG tablet Take 1 tablet (75 mg total) by mouth daily with breakfast. 30 tablet 11  . lactobacillus acidophilus (BACID) TABS tablet Take 2 tablets by mouth 3 (three) times daily. (Patient not taking: Reported on 12/08/2017) 60 tablet 0  . traMADol (ULTRAM) 50 MG tablet Take 1 tablet (50 mg total) by mouth every 12 (twelve) hours as needed for severe pain. (Patient not taking: Reported on  12/08/2017) 10 tablet 0   No facility-administered medications prior to visit.         Objective:   Physical Exam Vitals:   01/26/18 1442 01/26/18 1443  BP:  130/80  Pulse:  (!) 109  SpO2:  94%  Weight: 175 lb (79.4 kg)   Height: 5\' 3"  (1.6 m)    Gen: Pleasant, elderly woman, in no distress,  normal affect  ENT: No lesions,  mouth clear, dentures in place,  oropharynx clear, no postnasal drip  Neck: No JVD, no stridor  Lungs: No use of accessory muscles, distant, no wheeze  Cardiovascular: RRR, heart sounds normal, no murmur or gallops, trace peripheral edema  Musculoskeletal: No deformities, no cyanosis or clubbing  Neuro: alert, non focal  Skin: Warm, no lesions or rash      Assessment & Plan:  COPD (chronic obstructive pulmonary disease) ([place]) She has had progressive dyspnea over years and most bothersome over the last 6 months.  She underwent a cardiac catheterization that was reassuring.  I do believe that this is likely a manifestation of progressive COPD.  She is been on the same bronchodilators for years.  Also she had borderline desaturation with exertion even 6 years ago at our last visit.  She may very well desaturate today.  If so we will arrange for portable oxygen.  I believe that she would benefit from adding a LAMA to her regimen.  I will change her to Bevespi bid to see if she benefits.  I will defer risk stratification for Dr. Paulita Fujita until we have her on an optimal bronchodilator regimen.  Even when she is on a different inhaled regimen she still will likely be high risk for any  procedure with general anesthesia.  Walking oximetry on room air today. If you qualify for oxygen we will attempt to get you an inogen portable oxygen concentrator.  Please stop Symbicort for now.  Start Bevespi 2 puffs twice a day.  If you benefit from this medication then we will send a prescription to your pharmacy and continue it. Please keep Ventolin (albuterol) available to use  2 puffs if needed for shortness of breath, chest tightness, wheezing. We will perform full pulmonary function testing at your next visit to compare with your priors. Follow with Dr Lamonte Sakai in 1 month or next available with full PFT on the same day.  Baltazar Apo, MD, PhD 01/26/2018, 3:19 PM [facility] [phone] or if no answer [phone]

## 2018-01-26 NOTE — Patient Instructions (Signed)
Walking oximetry on room air today. If you qualify for oxygen we will attempt to get you an inogen portable oxygen concentrator.  Please stop Symbicort for now.  Start Bevespi 2 puffs twice a day.  If you benefit from this medication then we will send a prescription to your pharmacy and continue it. Please keep Ventolin (albuterol) available to use 2 puffs if needed for shortness of breath, chest tightness, wheezing. We will perform full pulmonary function testing at your next visit to compare with your priors. Follow with Dr Lamonte Sakai in 1 month or next available with full PFT on the same day.

## 2018-01-26 NOTE — Addendum Note (Signed)
Addended by: Desmond Dike C on: 01/26/2018 03:40 PM   Modules accepted: Orders

## 2018-01-28 ENCOUNTER — Telehealth: Payer: Self-pay | Admitting: Emergency Medicine

## 2018-01-28 ENCOUNTER — Telehealth: Payer: Self-pay

## 2018-01-28 NOTE — Telephone Encounter (Signed)
This information has been faxed to [facility]

## 2018-01-28 NOTE — Telephone Encounter (Signed)
Patient care coordination note has been update.

## 2018-01-28 NOTE — Telephone Encounter (Signed)
   [facility] Pre-operative Risk Assessment    Request for surgical clearance:  1. What type of surgery is being performed? Evaluation of colovaginal fistula  2. When is this surgery scheduled? TBD   3. What type of clearance is required (medical clearance vs. Pharmacy clearance to hold med vs. Both)? Cardiac  4. Are there any medications that need to be held prior to surgery and how long?  No  5. Practice name and name of physician performing surgery? [facility]  Dr Nadeen Landau   6. What is your office phone and fax number? P: [phone]  F: [phone]   7. Anesthesia type (None, local, MAC, general) ? General   Joanne [name] 01/28/2018, 4:26 PM  _________________________________________________________________   (provider comments below)

## 2018-01-28 NOTE — Telephone Encounter (Signed)
   Primary Cardiologist: Lauree Chandler, MD  Chart reviewed as part of pre-operative protocol coverage. Given past medical history and time since last visit, based on ACC/AHA guidelines, Joanne [name] would be at acceptable, but increased risk for the planned procedure without further cardiovascular testing.   She had a cardiac catheterization in February 2019 that was reassuring.  She is at increased but acceptable risk for the planned procedure without any additional cardiac workup.  She has been seen by Dr. Lamonte Sakai, see his note for his assessment of her respiratory risk of procedures.  I will route this recommendation to the requesting party via Epic fax function and remove from pre-op pool.  Please call with questions.  Rosaria Ferries, PA-C 01/28/2018, 4:35 PM

## 2018-01-28 NOTE — Telephone Encounter (Signed)
Gareth Morgan C        Got the order but I need for you to upload the walk test. That is the only thing I don't have. Thanks   Previous Messages    ----- Message -----  From: Harland German  Sent: 01/28/2018  1:11 PM  To: Rex Kras, Loreen Freud  Subject: [id number]                        1941-06-16  Order placed by Dr Lamonte Sakai please advise. Thanks Chantel

## 2018-01-29 NOTE — Telephone Encounter (Signed)
Spoke with Triage nurse and cardiac clearance was received.

## 2018-02-01 DIAGNOSIS — J449 Chronic obstructive pulmonary disease, unspecified: Secondary | ICD-10-CM | POA: Diagnosis not present

## 2018-02-04 DIAGNOSIS — J449 Chronic obstructive pulmonary disease, unspecified: Secondary | ICD-10-CM | POA: Diagnosis not present

## 2018-02-04 DIAGNOSIS — I2510 Atherosclerotic heart disease of native coronary artery without angina pectoris: Secondary | ICD-10-CM | POA: Diagnosis not present

## 2018-02-04 DIAGNOSIS — I10 Essential (primary) hypertension: Secondary | ICD-10-CM | POA: Diagnosis not present

## 2018-02-04 DIAGNOSIS — E669 Obesity, unspecified: Secondary | ICD-10-CM | POA: Diagnosis not present

## 2018-02-04 DIAGNOSIS — R0609 Other forms of dyspnea: Secondary | ICD-10-CM | POA: Diagnosis not present

## 2018-02-04 DIAGNOSIS — R7301 Impaired fasting glucose: Secondary | ICD-10-CM | POA: Diagnosis not present

## 2018-02-04 DIAGNOSIS — R69 Illness, unspecified: Secondary | ICD-10-CM | POA: Diagnosis not present

## 2018-02-04 DIAGNOSIS — C50919 Malignant neoplasm of unspecified site of unspecified female breast: Secondary | ICD-10-CM | POA: Diagnosis not present

## 2018-02-04 DIAGNOSIS — M545 Low back pain: Secondary | ICD-10-CM | POA: Diagnosis not present

## 2018-02-04 DIAGNOSIS — N183 Chronic kidney disease, stage 3 (moderate): Secondary | ICD-10-CM | POA: Diagnosis not present

## 2018-02-24 ENCOUNTER — Other Ambulatory Visit: Payer: Self-pay | Admitting: Cardiovascular Disease

## 2018-02-25 ENCOUNTER — Ambulatory Visit (INDEPENDENT_AMBULATORY_CARE_PROVIDER_SITE_OTHER): Payer: Managed Care, Other (non HMO) | Admitting: Emergency Medicine

## 2018-02-25 ENCOUNTER — Encounter: Payer: Self-pay | Admitting: *Deleted

## 2018-02-25 ENCOUNTER — Other Ambulatory Visit: Payer: Self-pay | Admitting: Cardiovascular Disease

## 2018-02-25 ENCOUNTER — Encounter: Payer: Self-pay | Admitting: Emergency Medicine

## 2018-02-25 DIAGNOSIS — I2510 Atherosclerotic heart disease of native coronary artery without angina pectoris: Secondary | ICD-10-CM | POA: Diagnosis not present

## 2018-02-25 DIAGNOSIS — J9611 Chronic respiratory failure with hypoxia: Secondary | ICD-10-CM | POA: Diagnosis not present

## 2018-02-25 DIAGNOSIS — J410 Simple chronic bronchitis: Secondary | ICD-10-CM

## 2018-02-25 DIAGNOSIS — J449 Chronic obstructive pulmonary disease, unspecified: Secondary | ICD-10-CM | POA: Diagnosis not present

## 2018-02-25 LAB — PULMONARY FUNCTION TEST
DL/VA % pred: 40 %
DL/VA: 1.92 ml/min/mmHg/L
DLCO unc % pred: 29 %
DLCO unc: 6.78 ml/min/mmHg
FEF 25-75 Post: 0.4 L/sec
FEF 25-75 Pre: 0.51 L/sec
FEF2575-%Change-Post: -22 %
FEF2575-%Pred-Post: 25 %
FEF2575-%Pred-Pre: 32 %
FEV1-%Change-Post: -7 %
FEV1-%Pred-Post: 56 %
FEV1-%Pred-Pre: 61 %
FEV1-Post: 1.14 L
FEV1-Pre: 1.23 L
FEV1FVC-%Change-Post: 0 %
FEV1FVC-%Pred-Pre: 77 %
FEV6-%Change-Post: -8 %
FEV6-%Pred-Post: 75 %
FEV6-%Pred-Pre: 81 %
FEV6-Post: 1.91 L
FEV6-Pre: 2.08 L
FEV6FVC-%Change-Post: 0 %
FEV6FVC-%Pred-Post: 103 %
FEV6FVC-%Pred-Pre: 103 %
FVC-%Change-Post: -7 %
FVC-%Pred-Post: 73 %
FVC-%Pred-Pre: 79 %
FVC-Post: 1.95 L
FVC-Pre: 2.12 L
Post FEV1/FVC ratio: 58 %
Post FEV6/FVC ratio: 98 %
Pre FEV1/FVC ratio: 58 %
Pre FEV6/FVC Ratio: 98 %
RV % pred: 137 %
RV: 3.08 L
TLC % pred: 104 %
TLC: 5.1 L

## 2018-02-25 MED ORDER — AEROCHAMBER MV MISC: 0 refills | Status: AC

## 2018-02-25 NOTE — Progress Notes (Signed)
Subjective:    Patient ID: Joanne [name], female    DOB: 1941-09-03, 77 y.o.   MRN: [id number]  HPI 77 year old woman  whom I have followed in the past for COPD.  She also has a history of coronary disease/CABG, hypertension, atrial fibrillation, breast cancer.  I last saw her 08/27/12.  She returns today reporting that she has had progressive exertional SOB over the last 6 months - she cannot do her housework. She has had several apparent flares with dyspnea, treated with steroids by Dr Sharlett Iles, last was 3 months ago.   She has been on symbicort for many years. She uses ventolin bid.   She notes that she needs a CSY w Dr Paulita Fujita, is waiting for pulm optimization and clearance to proceed with this,   She underwent a left heart catheterization 12/18/17 that showed stable multivessel disease, no re-stenosis of her LAD stent.  Normal LV filling pressures.  ROV 02/25/18 -- 77 yo woman, followed for COPD, also w a hx CAD. She has had progressive dyspnea, prompted a cath 11/2017 that was overall reassuring. Based on this we repeated pulmonary function testing today in the workPFT today that I have reviewed, shows moderately severe obstruction, progression of her FEV1 to 1.23L (61%) from 1.61L (72%). We also established that she desaturated, started 2L/min O2. We started bevespi in place of symbicort > better exercise tolerance, she is having a bit more cough, often associated with the bevespi. Uses ventolin about 2x a week. She has portable O2, has been sleeping with it.    Review of Systems  Constitutional: Negative for fever and unexpected weight change.  HENT: Negative for congestion, dental problem, ear pain, nosebleeds, postnasal drip, rhinorrhea, sinus pressure, sneezing, sore throat and trouble swallowing.   Eyes: Negative for redness and itching.  Respiratory: Positive for shortness of breath and wheezing. Negative for cough and chest tightness.   Cardiovascular: Negative for palpitations and leg  swelling.  Gastrointestinal: Negative for nausea and vomiting.  Genitourinary: Negative for dysuria.  Musculoskeletal: Negative for joint swelling.  Skin: Negative for rash.  Neurological: Negative for headaches.  Hematological: Does not bruise/bleed easily.  Psychiatric/Behavioral: Negative for dysphoric mood. The patient is not nervous/anxious.    Past Medical History:  Diagnosis Date  . Arthritis    "all over"  . Atrial fibrillation ([place])   . Breast cancer ([place]) 1997   "left"  . Chronic diastolic CHF (congestive heart failure) ([place])    a. 08/2010 Echo: EF 65-70%, Gr 1 DD, Mild MR  . Chronic lower back pain   . COPD (chronic obstructive pulmonary disease) ([place])   . Coronary artery disease    a. 11/2009 CABGx3: LIMA->LAD, VG->RCA, VG->OM2. b. Cath 10/11/2014 EF 50-55%,  patent SVG to OM2, occlueded SVG to OM1, atretic and nonfunctional LIMA to LAD, 90% ostial LAD lesion treated with PTCA/LAD  . GERD (gastroesophageal reflux disease)   . Heart murmur   . History of blood transfusion 11/2009   "related to OHS"   . Hyperlipidemia   . Hypertension   . Left renal artery stenosis ([place])    a. Moderate L RAS  . Myocardial infarction ([place]) 2011  . Scoliosis of thoracic spine      Family History  Problem Relation Age of Onset  . Heart failure Mother   . Heart disease Mother        Before age 45  . Diabetes Mother   . Hyperlipidemia Mother   . Hypertension Mother   .  COPD Father   . Heart disease Brother        Before age 18  . Hyperlipidemia Brother   . Hypertension Brother   . Cancer Sister        small  . Heart failure Grandchild   . Heart attack Brother   . Diabetes Brother      Social History   Socioeconomic History  . Marital status: Widowed    Spouse name: Not on file  . Number of children: 6  . Years of education: Not on file  . Highest education level: Not on file  Occupational History  . Occupation: Retired-[employer]    Employer: RETIRED  Social Needs  .  Financial resource strain: Not on file  . Food insecurity:    Worry: Not on file    Inability: Not on file  . Transportation needs:    Medical: Not on file    Non-medical: Not on file  Tobacco Use  . Smoking status: Former Smoker    Packs/day: 2.00    Years: 50.00    Pack years: 100.00    Types: Cigarettes    Last attempt to quit: 01/01/2010    Years since quitting: 8.1  . Smokeless tobacco: Never Used  Substance and Sexual Activity  . Alcohol use: No  . Drug use: No  . Sexual activity: Not on file  Lifestyle  . Physical activity:    Days per week: Not on file    Minutes per session: Not on file  . Stress: Not on file  Relationships  . Social connections:    Talks on phone: Not on file    Gets together: Not on file    Attends religious service: Not on file    Active member of club or organization: Not on file    Attends meetings of clubs or organizations: Not on file    Relationship status: Not on file  . Intimate partner violence:    Fear of current or ex partner: Not on file    Emotionally abused: Not on file    Physically abused: Not on file    Forced sexual activity: Not on file  Other Topics Concern  . Not on file  Social History Narrative  . Not on file     Allergies  Allergen Reactions  . Iohexol Anaphylaxis, Shortness Of Breath, Swelling and Other (See Comments)     Code: SOB, Desc: ct @ ser w/ throat swelling & sob, ok w/ 13 hr prep//a.c., Onset Date: 81017510   . Penicillins Anaphylaxis and Other (See Comments)    Tolerated Zosyn 09/2017.  TDD.  Has patient had a PCN reaction causing immediate rash, facial/tongue/throat swelling, SOB or lightheadedness with hypotension: Yes Has patient had a PCN reaction causing severe rash involving mucus membranes or skin necrosis: No Has patient had a PCN reaction that required hospitalization: No Has patient had a PCN reaction occurring within the last 10 years: No If all of the above answers are "NO", then may  proceed with Cephalosporin use.      Outpatient Medications Prior to Visit  Medication Sig Dispense Refill  . albuterol (PROVENTIL HFA;VENTOLIN HFA) 108 (90 BASE) MCG/ACT inhaler Inhale 1 puff into the lungs every 6 (six) hours as needed for wheezing or shortness of breath.    Marland Kitchen albuterol (PROVENTIL) (2.5 MG/3ML) 0.083% nebulizer solution Take 2.5 mg by nebulization 2 (two) times daily.     . cloNIDine (CATAPRES) 0.1 MG tablet TAKE 1 TABLET (0.1 MG  TOTAL) BY MOUTH 2 (TWO) TIMES DAILY. 60 tablet 8  . CRESTOR 40 MG tablet TAKE 1 TABLET BY MOUTH DAILY (Patient taking differently: Take 40 mg by mouth once a day) 30 tablet 0  . diphenhydrAMINE (BENADRYL) 25 MG tablet Take 50 mg by mouth once.    . ferrous sulfate 325 (65 FE) MG tablet Take 1 tablet (325 mg total) by mouth 2 (two) times daily with a meal. (Patient taking differently: Take 325 mg by mouth daily. ) 60 tablet 3  . Glycopyrrolate-Formoterol (BEVESPI AEROSPHERE) 9-4.8 MCG/ACT AERO Inhale 2 puffs into the lungs 2 (two) times daily. 2 Inhaler 0  . ipratropium (ATROVENT) 0.02 % nebulizer solution Take 0.5 mg by nebulization 2 (two) times daily.     . isosorbide mononitrate (IMDUR) 30 MG 24 hr tablet TAKE 1 TABLET (30 MG TOTAL) BY MOUTH DAILY. 30 tablet 11  . minoxidil (LONITEN) 2.5 MG tablet Take 2.5 mg by mouth daily.  10  . nitroGLYCERIN (NITROSTAT) 0.4 MG SL tablet PLACE 1 TABLET (0.4 MG TOTAL) UNDER THE TONGUE EVERY 5 (FIVE) MINUTES AS NEEDED FOR CHEST PAIN. 25 tablet 1  . pantoprazole (PROTONIX) 40 MG tablet TAKE 1 TABLET EVERY DAY (Patient taking differently: Take 40 mg by mouth once a day) 30 tablet 0  . verapamil (COVERA HS) 240 MG (CO) 24 hr tablet Take 240 mg by mouth every evening.     . zolpidem (AMBIEN) 10 MG tablet Take 0.5 tablets (5 mg total) by mouth at bedtime as needed for sleep. (Patient taking differently: Take 5-10 mg by mouth at bedtime as needed for sleep. )    . metoprolol tartrate (LOPRESSOR) 50 MG tablet Take 1  tablet (50 mg total) by mouth 2 (two) times daily. 60 tablet 6  . budesonide-formoterol (SYMBICORT) 160-4.5 MCG/ACT inhaler Inhale 2 puffs into the lungs 2 (two) times daily.    Marland Kitchen ALPRAZolam (XANAX) 0.25 MG tablet Take 0.25 mg by mouth daily as needed for anxiety.   3  . FLUoxetine (PROZAC) 20 MG capsule Take 20 mg by mouth daily.     No facility-administered medications prior to visit.         Objective:   Physical Exam Vitals:   02/25/18 1601  BP: 112/82  Pulse: 76  SpO2: 97%  Weight: 177 lb (80.3 kg)  Height: 5\' 3"  (1.6 m)   Gen: Pleasant, elderly woman, in no distress,  normal affect  ENT: No lesions,  mouth clear, dentures in place,  oropharynx clear, no postnasal drip  Neck: No JVD, no stridor  Lungs: No use of accessory muscles, distant, no wheeze  Cardiovascular: RRR, heart sounds normal, no murmur or gallops, trace peripheral edema  Musculoskeletal: No deformities, no cyanosis or clubbing  Neuro: alert, non focal  Skin: Warm, no lesions or rash      Assessment & Plan:  COPD (chronic obstructive pulmonary disease) (HCC) Confirmed on her pulmonary function testing today with progression compared with her priors.  She benefited from [place] and we will continue this.  We will try to use a spacer to see if this decreases cough and throat irritation.  She will continue to use albuterol if needed.  She had been using albuterol nebs on a schedule twice daily.  I encouraged her to try using it just as needed to see if she tolerates.  Chronic respiratory failure with hypoxia (H[facility]) Continue oxygen at 2 L/min.  I discussed with her the times it would be appropriate to use it  especially with exertion.  Baltazar Apo, MD, PhD 02/25/2018, 5:37 PM [facility] [phone] or if no answer [phone]

## 2018-02-25 NOTE — Progress Notes (Signed)
PFT completed today 02/25/18.

## 2018-02-25 NOTE — Assessment & Plan Note (Signed)
Confirmed on her pulmonary function testing today with progression compared with her priors.  She benefited from [place] and we will continue this.  We will try to use a spacer to see if this decreases cough and throat irritation.  She will continue to use albuterol if needed.  She had been using albuterol nebs on a schedule twice daily.  I encouraged her to try using it just as needed to see if she tolerates.

## 2018-02-25 NOTE — Patient Instructions (Addendum)
Please continue Bevespi.  We will try using this through a spacer to see if this helps prevent throat irritation and coughing.  Still please continuePlease continue Bevespi 2 puffs twice a day. We will try using this through a spacer to see if this helps to avoid throat irritation and cough.  You may use albuterol, either nebulized or 2 puff of your inhaler, up to every 4 hours if needed for shortness of breath.  Wear your oxygen with exertion at 2L/minute Follow with Dr Lamonte Sakai in 6 months or sooner if you have any problems

## 2018-02-25 NOTE — Assessment & Plan Note (Signed)
Continue oxygen at 2 L/min.  I discussed with her the times it would be appropriate to use it especially with exertion.

## 2018-02-26 ENCOUNTER — Telehealth: Payer: Self-pay | Admitting: Cardiovascular Disease

## 2018-02-26 NOTE — Telephone Encounter (Signed)
Instructed patient to continue taking Lasix 40 mg daily. Scheduled OV with B. [name], [place], Monday. Patient understands she will have labs drawn at that time. She was grateful for assistance.

## 2018-02-26 NOTE — Telephone Encounter (Signed)
I would recommend that she continue taking Lasix 40 mg daily. She will need to be seen in our office next week. Can we see if there is an APP appt available on my care team? She will need a BMET next week also on day of her visit. Thanks, chris

## 2018-02-26 NOTE — Telephone Encounter (Signed)
I spoke with patient who c/o BLE swelling that is all the way up to her pelvic area. She states it improves some at night. She states it started last weekend. She started taking Lasix 40 mg daily. She stated she has not been taking prior to the swelling. Last creatinine was 1.10 on 12/08/17. Patient states she drinks 8 bottles a water a day and she eats out about 3x/week. She had a weight gain of 5 pounds in 3 days from 172 lbs-177lbs.  She also c/o cough and sob. She was seen by Dr. Lamonte Sakai, pulmonologist yesterday and he switched her inhaler. I informed patient I would send to Dr. Angelena Form for recommendations. She verbalized understanding and thankful for the call.

## 2018-02-26 NOTE — Telephone Encounter (Signed)
New message   Pt c/o swelling: STAT is pt has developed SOB within 24 hours  1) How much weight have you gained and in what time span? n/a  2) If swelling, where is the swelling located? LEGS, STOMACH, ANKLES, FEET  3) Are you currently taking a fluid pill? YES  4) Are you currently SOB? YES, WEARS 2L O2  5) Do you have a log of your daily weights (if so, list)? 177lbs today  6) Have you gained 3 pounds in a day or 5 pounds in a week? n/a  7) Have you traveled recently? NO

## 2018-03-01 ENCOUNTER — Encounter: Payer: Self-pay | Admitting: Cardiology

## 2018-03-01 ENCOUNTER — Ambulatory Visit (INDEPENDENT_AMBULATORY_CARE_PROVIDER_SITE_OTHER): Payer: Medicare HMO | Admitting: Cardiology

## 2018-03-01 VITALS — BP 160/80 | HR 80 | Ht 63.0 in | Wt 176.8 lb

## 2018-03-01 DIAGNOSIS — R0600 Dyspnea, unspecified: Secondary | ICD-10-CM | POA: Diagnosis not present

## 2018-03-01 DIAGNOSIS — R600 Localized edema: Secondary | ICD-10-CM

## 2018-03-01 MED ORDER — FUROSEMIDE 40 MG PO TABS: 40.0000 mg | ORAL_TABLET | Freq: Every day | ORAL | 3 refills | Status: AC

## 2018-03-01 NOTE — Progress Notes (Signed)
03/01/2018 Joanne [name]   April 21, 1941  [id number]  Primary Physician Leanna Battles, MD Primary Cardiologist: Dr. Angelena Form   Reason for Visit/CC: Acute on chronic diastolic HF  HPI:  77 y/o female, followed by Dr. Angelena Form, with a history of CAD s/p CABG in 8099, diastolic dysfunction, RBBB, renal artery stenosis, HTN, HLD, COPD, former tobacco abuse, breast cancer s/p lumpectomy in 1997, post-op atrial fibrillation and anemia. Her coronary artery bypass grafting was in February 2011. She had a left internal mammary artery to the LAD, vein graft to OM2, and a vein graft to the RCA. She had cardiac cath December 2015 and a DES was placed in a severe ostial LAD stenosis. Last echo August 2016 with normal LV systolic function, trivial AI, mild MR.  Dr Early follows her renal artery stenosis.  She was seen recently in December 08, 2017 for preoperative clearance for colon surgery with general anesthesia.  At that visit she endorsed exertional dyspnea.  Case was discussed with the DOD, Dr. Acie Fredrickson, and it was recommended that she undergo definitive cardiac catheterization prior to being cleared for surgery.  Cardiac catheterization on December 18, 2017 showed stable multivessel CAD.  Complete angiographic details are outlined in cath report below.  Continued medical management of CAD was recommended.  It was suspected that her exertional dyspnea was likely due to her lung disease.  She was cleared to undergo her planned surgical procedure from a cardiac standpoint. She has not yet done this as she is still awaiting clearance from her pulmonologist.   Recently she developed bilateral lower extremity edema.  She called the office on Feb 26, 2018 with these complaints in the setting of missed doses of her Lasix.  The patient started back taking Lasix 40 mg daily.  Also reported drinking 8 bottles of water a day and also noted dietary indiscretion with sodium.  She often eats out at least 3 nights a week.  Also  noted a 5 pound weight gain in the course of 3 days from 172 pounds to 177 pounds.  She denied cough and dyspnea.  Given her recent symptoms, she was set up for an appointment today for further evaluation and for follow-up basic metabolic panel to assess renal function and potassium since Lasix was restarted.  She is here today with her son. She continues to have bilateral lower extremity edema, somewhat improved.  Has exertional dyspnea but denies resting dyspnea.  No orthopnea or PND. No chest pain.   Cardiac Studies  Procedures   LEFT HEART CATH AND CORS/GRAFTS ANGIOGRAPHY  Conclusion     Prox RCA lesion is 20% stenosed.  Dist RCA lesion is 20% stenosed.  Ost Cx to Prox Cx lesion is 30% stenosed.  SVG graft was visualized by angiography and is normal in caliber.  SVG graft was not visualized.  Origin to Prox Graft lesion is 100% stenosed.  LIMA graft was not visualized.  Previously placed Dist LM to Prox LAD stent (unknown type) is widely patent.   1. Stable multi-vessel CAD 2. There is no left main. The LAD and circumflex arise from different ostia 3. The proximal LAD stent is patent without restenosis 4. The Circumflex has mild proximal disease and the first OM is patent. The vein graft to the first OM is known to be occluded. The second OM is patent and flow is seen both antegrade and from the patent bypass graft. The distal AV groove Circumflex is chronically occluded.  5. The RCA is a  large dominant vessel with mild non-obstructive disease.  6. Normal LV filling pressures.   Recommendations: Medical management of CAD. Proceed with her planned surgical procedure. I suspect that her dyspnea is due to her lung disease.    Coronary Diagrams   Diagnostic Diagram           Current Meds  Medication Sig  . albuterol (PROVENTIL HFA;VENTOLIN HFA) 108 (90 BASE) MCG/ACT inhaler Inhale 1 puff into the lungs every 6 (six) hours as needed for wheezing or shortness of  breath.  Marland Kitchen albuterol (PROVENTIL) (2.5 MG/3ML) 0.083% nebulizer solution Take 2.5 mg by nebulization 2 (two) times daily.   . cloNIDine (CATAPRES) 0.1 MG tablet TAKE 1 TABLET (0.1 MG TOTAL) BY MOUTH 2 (TWO) TIMES DAILY.  Marland Kitchen CRESTOR 40 MG tablet TAKE 1 TABLET BY MOUTH DAILY (Patient taking differently: Take 40 mg by mouth once a day)  . ferrous sulfate 325 (65 FE) MG tablet Take 1 tablet (325 mg total) by mouth 2 (two) times daily with a meal. (Patient taking differently: Take 325 mg by mouth daily. )  . Glycopyrrolate-Formoterol (BEVESPI AEROSPHERE) 9-4.8 MCG/ACT AERO Inhale 2 puffs into the lungs 2 (two) times daily.  . isosorbide mononitrate (IMDUR) 30 MG 24 hr tablet TAKE 1 TABLET (30 MG TOTAL) BY MOUTH DAILY.  . metoprolol tartrate (LOPRESSOR) 50 MG tablet TAKE 1 TABLET BY MOUTH TWICE A DAY  . minoxidil (LONITEN) 2.5 MG tablet Take 2.5 mg by mouth daily.  . nitroGLYCERIN (NITROSTAT) 0.4 MG SL tablet PLACE 1 TABLET (0.4 MG TOTAL) UNDER THE TONGUE EVERY 5 (FIVE) MINUTES AS NEEDED FOR CHEST PAIN.  Marland Kitchen pantoprazole (PROTONIX) 40 MG tablet TAKE 1 TABLET EVERY DAY (Patient taking differently: Take 40 mg by mouth once a day)  . Spacer/Aero-Holding Chambers (AEROCHAMBER MV) inhaler Use as instructed  . verapamil (COVERA HS) 240 MG (CO) 24 hr tablet Take 240 mg by mouth every evening.   . zolpidem (AMBIEN) 10 MG tablet Take 0.5 tablets (5 mg total) by mouth at bedtime as needed for sleep. (Patient taking differently: Take 5-10 mg by mouth at bedtime as needed for sleep. )  . [DISCONTINUED] budesonide-formoterol (SYMBICORT) 160-4.5 MCG/ACT inhaler Inhale 2 puffs into the lungs 2 (two) times daily.   Allergies  Allergen Reactions  . Iohexol Anaphylaxis, Shortness Of Breath, Swelling and Other (See Comments)     Code: SOB, Desc: ct @ ser w/ throat swelling & sob, ok w/ 13 hr prep//a.c., Onset Date: [id number]   . Penicillins Anaphylaxis and Other (See Comments)    Tolerated Zosyn 09/2017.  TDD.  Has  patient had a PCN reaction causing immediate rash, facial/tongue/throat swelling, SOB or lightheadedness with hypotension: Yes Has patient had a PCN reaction causing severe rash involving mucus membranes or skin necrosis: No Has patient had a PCN reaction that required hospitalization: No Has patient had a PCN reaction occurring within the last 10 years: No If all of the above answers are "NO", then may proceed with Cephalosporin use.    Past Medical History:  Diagnosis Date  . Arthritis    "all over"  . Atrial fibrillation ([place])   . Breast cancer ([place]) 1997   "left"  . Chronic diastolic CHF (congestive heart failure) ([place])    a. 08/2010 Echo: EF 65-70%, Gr 1 DD, Mild MR  . Chronic lower back pain   . COPD (chronic obstructive pulmonary disease) ([place])   . Coronary artery disease    a. 11/2009 CABGx3: LIMA->LAD, VG->RCA, VG->OM2. b. Cath  10/11/2014 EF 50-55%,  patent SVG to OM2, occlueded SVG to OM1, atretic and nonfunctional LIMA to LAD, 90% ostial LAD lesion treated with PTCA/LAD  . GERD (gastroesophageal reflux disease)   . Heart murmur   . History of blood transfusion 11/2009   "related to OHS"   . Hyperlipidemia   . Hypertension   . Left renal artery stenosis ([place])    a. Moderate L RAS  . Myocardial infarction ([place]) 2011  . Scoliosis of thoracic spine    Family History  Problem Relation Age of Onset  . Heart failure Mother   . Heart disease Mother        Before age 77  . Diabetes Mother   . Hyperlipidemia Mother   . Hypertension Mother   . COPD Father   . Heart disease Brother        Before age 77  . Hyperlipidemia Brother   . Hypertension Brother   . Cancer Sister        small  . Heart failure Grandchild   . Heart attack Brother   . Diabetes Brother    Past Surgical History:  Procedure Laterality Date  . ABDOMINAL HYSTERECTOMY  1970's  . APPENDECTOMY  1970's  . BREAST BIOPSY Left 1997  . BREAST LUMPECTOMY Left 1997  . CARDIAC CATHETERIZATION  11/2009  .  CORONARY ANGIOPLASTY WITH STENT PLACEMENT  10/11/2014   "1"  . CORONARY ARTERY BYPASS GRAFT  12/21/2009   CABGX3  Dr. Prescott Gum  . LAPAROSCOPIC CHOLECYSTECTOMY  2001  . LEFT HEART CATH AND CORS/GRAFTS ANGIOGRAPHY N/A 12/18/2017   Procedure: LEFT HEART CATH AND CORS/GRAFTS ANGIOGRAPHY;  Surgeon: Burnell Blanks, MD;  Location: [place] [facility];  Service: Cardiovascular;  Laterality: N/A;  . LEFT HEART CATHETERIZATION WITH CORONARY ANGIOGRAM N/A 10/11/2014   Procedure: LEFT HEART CATHETERIZATION WITH CORONARY ANGIOGRAM;  Surgeon: Burnell Blanks, MD;  Location: [facility] CATH LAB;  Service: Cardiovascular;  Laterality: N/A;  . TONSILLECTOMY  1973  . TUBAL LIGATION  1982?   Social History   Socioeconomic History  . Marital status: Widowed    Spouse name: Not on file  . Number of children: 6  . Years of education: Not on file  . Highest education level: Not on file  Occupational History  . Occupation: Retired-[employer]    Employer: RETIRED  Social Needs  . Financial resource strain: Not on file  . Food insecurity:    Worry: Not on file    Inability: Not on file  . Transportation needs:    Medical: Not on file    Non-medical: Not on file  Tobacco Use  . Smoking status: Former Smoker    Packs/day: 2.00    Years: 50.00    Pack years: 100.00    Types: Cigarettes    Last attempt to quit: 01/01/2010    Years since quitting: 8.1  . Smokeless tobacco: Never Used  Substance and Sexual Activity  . Alcohol use: No  . Drug use: No  . Sexual activity: Not on file  Lifestyle  . Physical activity:    Days per week: Not on file    Minutes per session: Not on file  . Stress: Not on file  Relationships  . Social connections:    Talks on phone: Not on file    Gets together: Not on file    Attends religious service: Not on file    Active member of club or organization: Not on file    Attends meetings  of clubs or organizations: Not on file    Relationship status: Not on file  .  Intimate partner violence:    Fear of current or ex partner: Not on file    Emotionally abused: Not on file    Physically abused: Not on file    Forced sexual activity: Not on file  Other Topics Concern  . Not on file  Social History Narrative  . Not on file     Review of Systems: General: negative for chills, fever, night sweats or weight changes.  Cardiovascular: negative for chest pain, dyspnea on exertion, edema, orthopnea, palpitations, paroxysmal nocturnal dyspnea or shortness of breath Dermatological: negative for rash Respiratory: negative for cough or wheezing Urologic: negative for hematuria Abdominal: negative for nausea, vomiting, diarrhea, bright red blood per rectum, melena, or hematemesis Neurologic: negative for visual changes, syncope, or dizziness All other systems reviewed and are otherwise negative except as noted above.   Physical Exam:  Blood pressure (!) 160/80, pulse 80, height 5\' 3"  (1.6 m), weight 176 lb 12 oz (80.2 kg).  General appearance: alert, cooperative and no distress Neck: no carotid bruit and no JVD Lungs: clear to auscultation bilaterally Heart: regular rate and rhythm, S1, S2 normal, no murmur, click, rub or gallop Extremities: 2+ bilateral LEE Pulses: 2+ and symmetric Skin: Skin color, texture, turgor normal. No rashes or lesions Neurologic: Grossly normal  EKG not performed -- personally reviewed   ASSESSMENT AND PLAN:   1.  Acute on chronic diastolic CHF: Patient visibly volume overloaded with 2+ bilateral lower extremity edema.  Lungs are clear to auscultation bilaterally.  She denies resting dyspnea but has some mild exertional dyspnea.  Fluid overload is in the setting of noncompliance with Lasix.  She reports that she was only taking Lasix several days a week instead of the recommended daily dose.  She has since restarted Lasix at 40 mg daily but continues to have significant edema.  We will subsequently have her increase her Lasix  further to 40 mg twice daily, at least for the next 3 days.  We will recheck a basic metabolic panel today to monitor renal functional and electrolytes.  If needed, we will add supplemental potassium.  We will also check a BNP today for baseline measurement.  We discussed the importance of adherence to a low-sodium diet as well as fluid restriction.  Patient instructed to limit sodium to no more than 2000 mg daily and fluids to no more than 2 L daily.  Continue daily weights at home and she will notify our office if she has any additional weight gain.  We will also check an echocardiogram to reassess left ventricular EF.  We will have the patient follow-up in 2 weeks for repeat assessment.  2. CAD: h/o CABG. Recent LHC 11/2017 showed stable disease. Continued medical therapy recommended.   Follow-Up in 2 weeks.   Efe Fazzino Ladoris Gene, MHS [facility] 03/01/2018 3:37 PM

## 2018-03-01 NOTE — Patient Instructions (Addendum)
Medication Instructions:   START TAKING LASIX  40 MG TWICE A DAY  FOR 3 DAYS  THEN  RESUME  40 MG ONCE A DAY     If you need a refill on your cardiac medications before your next appointment, please call your pharmacy.  Labwork:  BNP AND BMET  TODAY    Testing/Procedures: Your physician has requested that you have an echocardiogram. Echocardiography is a painless test that uses sound waves to create images of your heart. It provides your doctor with information about the size and shape of your heart and how well your heart's chambers and valves are working. This procedure takes approximately one hour. There are no restrictions for this procedure.    Follow-Up:  IN 2 WEEKS [place]    Any Other Special Instructions Will Be Listed Below (If Applicable).    Low-Sodium Eating Plan Sodium, which is an element that makes up salt, helps you maintain a healthy balance of fluids in your body. Too much sodium can increase your blood pressure and cause fluid and waste to be held in your body. Your health care provider or dietitian may recommend following this plan if you have high blood pressure (hypertension), kidney disease, liver disease, or heart failure. Eating less sodium can help lower your blood pressure, reduce swelling, and protect your heart, liver, and kidneys. What are tips for following this plan? General guidelines  Most people on this plan should limit their sodium intake to 1,500-2,000 mg (milligrams) of sodium each day. Reading food labels  The Nutrition Facts label lists the amount of sodium in one serving of the food. If you eat more than one serving, you must multiply the listed amount of sodium by the number of servings.  Choose foods with less than 140 mg of sodium per serving.  Avoid foods with 300 mg of sodium or more per serving. Shopping  Look for lower-sodium products, often labeled as "low-sodium" or "no salt added."  Always check the sodium content even if  foods are labeled as "unsalted" or "no salt added".  Buy fresh foods. ? Avoid canned foods and premade or frozen meals. ? Avoid canned, cured, or processed meats  Buy breads that have less than 80 mg of sodium per slice. Cooking  Eat more home-cooked food and less restaurant, buffet, and fast food.  Avoid adding salt when cooking. Use salt-free seasonings or herbs instead of table salt or sea salt. Check with your health care provider or pharmacist before using salt substitutes.  Cook with plant-based oils, such as canola, sunflower, or olive oil. Meal planning  When eating at a restaurant, ask that your food be prepared with less salt or no salt, if possible.  Avoid foods that contain MSG (monosodium glutamate). MSG is sometimes added to [place] food, bouillon, and some canned foods. What foods are recommended? The items listed may not be a complete list. Talk with your dietitian about what dietary choices are best for you. Grains Low-sodium cereals, including oats, puffed wheat and rice, and shredded wheat. Low-sodium crackers. Unsalted rice. Unsalted pasta. Low-sodium bread. Whole-grain breads and whole-grain pasta. Vegetables Fresh or frozen vegetables. "No salt added" canned vegetables. "No salt added" tomato sauce and paste. Low-sodium or reduced-sodium tomato and vegetable juice. Fruits Fresh, frozen, or canned fruit. Fruit juice. Meats and other protein foods Fresh or frozen (no salt added) meat, poultry, seafood, and fish. Low-sodium canned tuna and salmon. Unsalted nuts. Dried peas, beans, and lentils without added salt. Unsalted canned beans.  Eggs. Unsalted nut butters. Dairy Milk. Soy milk. Cheese that is naturally low in sodium, such as ricotta cheese, fresh mozzarella, or Swiss cheese Low-sodium or reduced-sodium cheese. Cream cheese. Yogurt. Fats and oils Unsalted butter. Unsalted margarine with no trans fat. Vegetable oils such as canola or olive oils. Seasonings and  other foods Fresh and dried herbs and spices. Salt-free seasonings. Low-sodium mustard and ketchup. Sodium-free salad dressing. Sodium-free light mayonnaise. Fresh or refrigerated horseradish. Lemon juice. Vinegar. Homemade, reduced-sodium, or low-sodium soups. Unsalted popcorn and pretzels. Low-salt or salt-free chips. What foods are not recommended? The items listed may not be a complete list. Talk with your dietitian about what dietary choices are best for you. Grains Instant hot cereals. Bread stuffing, pancake, and biscuit mixes. Croutons. Seasoned rice or pasta mixes. Noodle soup cups. Boxed or frozen macaroni and cheese. Regular salted crackers. Self-rising flour. Vegetables Sauerkraut, pickled vegetables, and relishes. Olives. [place] fries. Onion rings. Regular canned vegetables (not low-sodium or reduced-sodium). Regular canned tomato sauce and paste (not low-sodium or reduced-sodium). Regular tomato and vegetable juice (not low-sodium or reduced-sodium). Frozen vegetables in sauces. Meats and other protein foods Meat or fish that is salted, canned, smoked, spiced, or pickled. Bacon, ham, sausage, hotdogs, corned beef, chipped beef, packaged lunch meats, salt pork, jerky, pickled herring, anchovies, regular canned tuna, sardines, salted nuts. Dairy Processed cheese and cheese spreads. Cheese curds. Blue cheese. Feta cheese. String cheese. Regular cottage cheese. Buttermilk. Canned milk. Fats and oils Salted butter. Regular margarine. Ghee. Bacon fat. Seasonings and other foods Onion salt, garlic salt, seasoned salt, table salt, and sea salt. Canned and packaged gravies. Worcestershire sauce. Tartar sauce. Barbecue sauce. Teriyaki sauce. Soy sauce, including reduced-sodium. Steak sauce. Fish sauce. Oyster sauce. Cocktail sauce. Horseradish that you find on the shelf. Regular ketchup and mustard. Meat flavorings and tenderizers. Bouillon cubes. Hot sauce and Tabasco sauce. Premade or packaged  marinades. Premade or packaged taco seasonings. Relishes. Regular salad dressings. Salsa. Potato and tortilla chips. Corn chips and puffs. Salted popcorn and pretzels. Canned or dried soups. Pizza. Frozen entrees and pot pies. Summary  Eating less sodium can help lower your blood pressure, reduce swelling, and protect your heart, liver, and kidneys.  Most people on this plan should limit their sodium intake to 1,500-2,000 mg (milligrams) of sodium each day.  Canned, boxed, and frozen foods are high in sodium. Restaurant foods, fast foods, and pizza are also very high in sodium. You also get sodium by adding salt to food.  Try to cook at home, eat more fresh fruits and vegetables, and eat less fast food, canned, processed, or prepared foods. This information is not intended to replace advice given to you by your health care provider. Make sure you discuss any questions you have with your health care provider. Document Released: 04/04/2002 Document Revised: 10/06/2016 Document Reviewed: 10/06/2016 Elsevier Interactive Patient Education  [employer].

## 2018-03-02 ENCOUNTER — Telehealth: Payer: Self-pay | Admitting: *Deleted

## 2018-03-02 DIAGNOSIS — I5032 Chronic diastolic (congestive) heart failure: Secondary | ICD-10-CM

## 2018-03-02 LAB — BASIC METABOLIC PANEL
BUN / CREAT RATIO: 13 (ref 12–28)
BUN: 21 mg/dL (ref 8–27)
CO2: 24 mmol/L (ref 20–29)
CREATININE: 1.64 mg/dL — AB (ref 0.57–1.00)
Calcium: 9.2 mg/dL (ref 8.7–10.3)
Chloride: 100 mmol/L (ref 96–106)
GFR calc Af Amer: 35 mL/min/{1.73_m2} — ABNORMAL LOW (ref >59)
GFR calc non Af Amer: 30 mL/min/{1.73_m2} — ABNORMAL LOW (ref >59)
Glucose: 95 mg/dL (ref 65–99)
POTASSIUM: 4.5 mmol/L (ref 3.5–5.2)
SODIUM: 142 mmol/L (ref 134–144)

## 2018-03-02 LAB — PRO B NATRIURETIC PEPTIDE: NT-Pro BNP: 2105 pg/mL — ABNORMAL HIGH (ref 0–738)

## 2018-03-02 MED ORDER — POTASSIUM CHLORIDE CRYS ER 20 MEQ PO TBCR: 20.0000 meq | EXTENDED_RELEASE_TABLET | Freq: Every day | ORAL | 6 refills | Status: AC

## 2018-03-02 NOTE — Telephone Encounter (Signed)
SPOKE WITH PT ABOUT LAB RESULTS AND MEDICATION CHANGES.  LASIX 40 MG TWICE A DAY FOR 5 DAYS AND RESUME 40 MG ONCE A DAY.  ONLY WHILE TAKING LASIX TWICE A DAY  ALSO TAKE POTASSIUM 20 MEQ DAILY. THEN STOP POTASSIUM AFTER 5 DAYS.  RETURN FOR REPEAT BLOOD WORK  BMP AND BMP  FOR REASSESS FLUID POTASSIUM  AND RENAL FUNCTION ON 03-09-18.  PT WAS AGREEABLE WITH RECOMMENDATIONS AND CHANGES. AND WILL CALL IF HAVE ANY FURTHER QUESTIONS.

## 2018-03-02 NOTE — Telephone Encounter (Signed)
-----   Message from Consuelo Pandy, [place] sent at 03/02/2018  9:50 AM EDT ----- Fluid marker is high, BNP at 2,105. She needs to increase Lasix to 40 mg BID x 5 days. Add supplemental potassium, 20 meq daily. After 5 days, get a repeat BNP and BMP to reassess fluid status as well as renal function and K.

## 2018-03-03 DIAGNOSIS — J449 Chronic obstructive pulmonary disease, unspecified: Secondary | ICD-10-CM | POA: Diagnosis not present

## 2018-03-09 ENCOUNTER — Other Ambulatory Visit (HOSPITAL_COMMUNITY): Payer: Medicare HMO

## 2018-03-09 ENCOUNTER — Other Ambulatory Visit: Payer: Medicare HMO

## 2018-03-24 ENCOUNTER — Ambulatory Visit: Payer: Medicare HMO | Admitting: Cardiology

## 2018-04-01 ENCOUNTER — Ambulatory Visit (HOSPITAL_COMMUNITY): Payer: Medicare HMO | Attending: Cardiology

## 2018-04-01 ENCOUNTER — Encounter (INDEPENDENT_AMBULATORY_CARE_PROVIDER_SITE_OTHER): Payer: Self-pay

## 2018-04-01 ENCOUNTER — Other Ambulatory Visit: Payer: Self-pay

## 2018-04-01 ENCOUNTER — Other Ambulatory Visit: Payer: Medicare HMO

## 2018-04-01 DIAGNOSIS — Z8249 Family history of ischemic heart disease and other diseases of the circulatory system: Secondary | ICD-10-CM | POA: Diagnosis not present

## 2018-04-01 DIAGNOSIS — J449 Chronic obstructive pulmonary disease, unspecified: Secondary | ICD-10-CM | POA: Insufficient documentation

## 2018-04-01 DIAGNOSIS — I071 Rheumatic tricuspid insufficiency: Secondary | ICD-10-CM | POA: Insufficient documentation

## 2018-04-01 DIAGNOSIS — Z87891 Personal history of nicotine dependence: Secondary | ICD-10-CM | POA: Insufficient documentation

## 2018-04-01 DIAGNOSIS — I5032 Chronic diastolic (congestive) heart failure: Secondary | ICD-10-CM

## 2018-04-01 DIAGNOSIS — I2510 Atherosclerotic heart disease of native coronary artery without angina pectoris: Secondary | ICD-10-CM | POA: Insufficient documentation

## 2018-04-01 DIAGNOSIS — R0600 Dyspnea, unspecified: Secondary | ICD-10-CM

## 2018-04-01 DIAGNOSIS — Z853 Personal history of malignant neoplasm of breast: Secondary | ICD-10-CM | POA: Diagnosis not present

## 2018-04-01 DIAGNOSIS — R600 Localized edema: Secondary | ICD-10-CM | POA: Diagnosis not present

## 2018-04-01 DIAGNOSIS — I4891 Unspecified atrial fibrillation: Secondary | ICD-10-CM | POA: Diagnosis not present

## 2018-04-01 DIAGNOSIS — I4510 Unspecified right bundle-branch block: Secondary | ICD-10-CM | POA: Insufficient documentation

## 2018-04-01 DIAGNOSIS — Z951 Presence of aortocoronary bypass graft: Secondary | ICD-10-CM | POA: Insufficient documentation

## 2018-04-01 DIAGNOSIS — I252 Old myocardial infarction: Secondary | ICD-10-CM | POA: Insufficient documentation

## 2018-04-01 LAB — PRO B NATRIURETIC PEPTIDE: NT-Pro BNP: 1795 pg/mL — ABNORMAL HIGH (ref 0–738)

## 2018-04-01 LAB — BASIC METABOLIC PANEL
BUN / CREAT RATIO: 19 (ref 12–28)
BUN: 22 mg/dL (ref 8–27)
CHLORIDE: 109 mmol/L — AB (ref 96–106)
CO2: 21 mmol/L (ref 20–29)
Calcium: 8.7 mg/dL (ref 8.7–10.3)
Creatinine, Ser: 1.13 mg/dL — ABNORMAL HIGH (ref 0.57–1.00)
GFR calc non Af Amer: 48 mL/min/{1.73_m2} — ABNORMAL LOW (ref >59)
GFR, EST AFRICAN AMERICAN: 55 mL/min/{1.73_m2} — AB (ref >59)
Glucose: 111 mg/dL — ABNORMAL HIGH (ref 65–99)
POTASSIUM: 4.8 mmol/L (ref 3.5–5.2)
SODIUM: 145 mmol/L — AB (ref 134–144)

## 2018-04-02 ENCOUNTER — Telehealth: Payer: Self-pay | Admitting: Cardiology

## 2018-04-02 ENCOUNTER — Other Ambulatory Visit: Payer: Self-pay | Admitting: *Deleted

## 2018-04-02 DIAGNOSIS — I5043 Acute on chronic combined systolic (congestive) and diastolic (congestive) heart failure: Secondary | ICD-10-CM

## 2018-04-02 NOTE — Telephone Encounter (Signed)
Pt returning your call concerning lab results. Please call pt.

## 2018-04-02 NOTE — Telephone Encounter (Signed)
See lab and echo results ./cy

## 2018-04-02 NOTE — Telephone Encounter (Signed)
Lm to call back ./cy 

## 2018-04-03 DIAGNOSIS — J449 Chronic obstructive pulmonary disease, unspecified: Secondary | ICD-10-CM | POA: Diagnosis not present

## 2018-04-12 ENCOUNTER — Other Ambulatory Visit: Payer: Medicare HMO

## 2018-04-16 ENCOUNTER — Ambulatory Visit: Payer: Medicare HMO | Admitting: Cardiology

## 2018-04-21 ENCOUNTER — Ambulatory Visit: Payer: Medicare HMO | Admitting: Cardiology

## 2018-05-03 DIAGNOSIS — J449 Chronic obstructive pulmonary disease, unspecified: Secondary | ICD-10-CM | POA: Diagnosis not present

## 2018-05-05 ENCOUNTER — Ambulatory Visit: Payer: Medicare HMO | Admitting: Physician Assistant

## 2018-06-03 DIAGNOSIS — J449 Chronic obstructive pulmonary disease, unspecified: Secondary | ICD-10-CM | POA: Diagnosis not present

## 2018-06-23 ENCOUNTER — Ambulatory Visit: Payer: Medicare HMO | Admitting: Physician Assistant

## 2018-07-04 DIAGNOSIS — J449 Chronic obstructive pulmonary disease, unspecified: Secondary | ICD-10-CM | POA: Diagnosis not present

## 2018-07-25 DIAGNOSIS — R69 Illness, unspecified: Secondary | ICD-10-CM | POA: Diagnosis not present

## 2018-08-03 DIAGNOSIS — J449 Chronic obstructive pulmonary disease, unspecified: Secondary | ICD-10-CM | POA: Diagnosis not present

## 2018-09-03 DIAGNOSIS — J449 Chronic obstructive pulmonary disease, unspecified: Secondary | ICD-10-CM | POA: Diagnosis not present

## 2018-10-03 DIAGNOSIS — J449 Chronic obstructive pulmonary disease, unspecified: Secondary | ICD-10-CM | POA: Diagnosis not present

## 2018-11-18 ENCOUNTER — Other Ambulatory Visit: Payer: Self-pay | Admitting: Cardiovascular Disease

## 2018-11-27 DEATH — deceased

## 2019-01-16 ENCOUNTER — Other Ambulatory Visit: Payer: Self-pay | Admitting: Cardiovascular Disease

## 2019-01-24 ENCOUNTER — Telehealth: Payer: Self-pay

## 2019-01-24 ENCOUNTER — Other Ambulatory Visit: Payer: Self-pay

## 2019-01-24 NOTE — Telephone Encounter (Signed)
Attempted to call pt to discuss postponing 4/1 appt d/t COVID but pts number has been disconnected and the alternate phone number is to a grocery store.

## 2019-01-25 ENCOUNTER — Telehealth: Payer: Self-pay | Admitting: Cardiovascular Disease

## 2019-01-25 ENCOUNTER — Encounter

## 2019-01-25 ENCOUNTER — Telehealth: Payer: Self-pay

## 2019-01-25 NOTE — Telephone Encounter (Signed)
Follow Up:   Son called and said pt passed away on 11/15/18.

## 2019-01-25 NOTE — Telephone Encounter (Signed)
Chart has been updated.

## 2019-01-25 NOTE — Telephone Encounter (Signed)
Calling pt to cancel her 4/1 appt with [facility] but pts number has been disconnected  Called pts son, Joanne [name] and lmtcb regarding his mothers appt on 4/1

## 2019-01-26 ENCOUNTER — Ambulatory Visit: Payer: Medicare HMO | Admitting: Cardiovascular Disease
